# Patient Record
Sex: Male | Born: 1940 | Hispanic: Yes | Smoking: Former smoker
Health system: Southern US, Community
[De-identification: ages and names within clinical notes are randomized; demographics above are authoritative.]

## PROBLEM LIST (undated history)

## (undated) DIAGNOSIS — F1910 Other psychoactive substance abuse, uncomplicated: Secondary | ICD-10-CM

## (undated) DIAGNOSIS — H9193 Unspecified hearing loss, bilateral: Secondary | ICD-10-CM

## (undated) DIAGNOSIS — I10 Essential (primary) hypertension: Secondary | ICD-10-CM

## (undated) DIAGNOSIS — B1920 Unspecified viral hepatitis C without hepatic coma: Secondary | ICD-10-CM

## (undated) DIAGNOSIS — E119 Type 2 diabetes mellitus without complications: Secondary | ICD-10-CM

## (undated) DIAGNOSIS — C220 Liver cell carcinoma: Secondary | ICD-10-CM

## (undated) DIAGNOSIS — A048 Other specified bacterial intestinal infections: Secondary | ICD-10-CM

## (undated) DIAGNOSIS — D638 Anemia in other chronic diseases classified elsewhere: Secondary | ICD-10-CM

## (undated) DIAGNOSIS — I219 Acute myocardial infarction, unspecified: Secondary | ICD-10-CM

## (undated) DIAGNOSIS — F0390 Unspecified dementia without behavioral disturbance: Secondary | ICD-10-CM

## (undated) DIAGNOSIS — K7460 Unspecified cirrhosis of liver: Secondary | ICD-10-CM

## (undated) DIAGNOSIS — E785 Hyperlipidemia, unspecified: Secondary | ICD-10-CM

## (undated) DIAGNOSIS — H269 Unspecified cataract: Secondary | ICD-10-CM

## (undated) HISTORY — DX: Liver cell carcinoma: C22.0

## (undated) HISTORY — DX: Other psychoactive substance abuse, uncomplicated: F19.10

## (undated) HISTORY — DX: Anemia in other chronic diseases classified elsewhere: D63.8

## (undated) HISTORY — DX: Unspecified cataract: H26.9

## (undated) HISTORY — PX: UPPER GASTROINTESTINAL ENDOSCOPY: SHX188

## (undated) HISTORY — DX: Unspecified hearing loss, bilateral: H91.93

## (undated) HISTORY — DX: Other specified bacterial intestinal infections: A04.8

## (undated) HISTORY — DX: Hyperlipidemia, unspecified: E78.5

## (undated) HISTORY — DX: Essential (primary) hypertension: I10

## (undated) HISTORY — DX: Acute myocardial infarction, unspecified: I21.9

## (undated) HISTORY — PX: PACEMAKER INSERTION: SHX728

## (undated) HISTORY — DX: Unspecified viral hepatitis C without hepatic coma: B19.20

## (undated) HISTORY — DX: Unspecified dementia, unspecified severity, without behavioral disturbance, psychotic disturbance, mood disturbance, and anxiety: F03.90

## (undated) HISTORY — DX: Type 2 diabetes mellitus without complications: E11.9

---

## 1964-02-04 HISTORY — PX: COLOSTOMY REVERSAL: SHX5782

## 1964-02-04 HISTORY — PX: STOMACH SURGERY: SHX791

## 1984-02-04 HISTORY — PX: HERNIA REPAIR: SHX51

## 2007-02-04 DIAGNOSIS — I219 Acute myocardial infarction, unspecified: Secondary | ICD-10-CM

## 2007-02-04 HISTORY — DX: Acute myocardial infarction, unspecified: I21.9

## 2007-02-04 HISTORY — PX: CORONARY ANGIOPLASTY WITH STENT PLACEMENT: SHX49

## 2012-10-05 DIAGNOSIS — H2510 Age-related nuclear cataract, unspecified eye: Secondary | ICD-10-CM | POA: Diagnosis not present

## 2012-10-05 DIAGNOSIS — E119 Type 2 diabetes mellitus without complications: Secondary | ICD-10-CM | POA: Diagnosis not present

## 2012-10-05 DIAGNOSIS — H26019 Infantile and juvenile cortical, lamellar, or zonular cataract, unspecified eye: Secondary | ICD-10-CM | POA: Diagnosis not present

## 2012-10-07 ENCOUNTER — Encounter: Payer: Self-pay | Admitting: Family Medicine

## 2012-10-07 ENCOUNTER — Telehealth: Payer: Self-pay | Admitting: *Deleted

## 2012-10-07 ENCOUNTER — Ambulatory Visit (INDEPENDENT_AMBULATORY_CARE_PROVIDER_SITE_OTHER): Payer: Medicare Other | Admitting: Family Medicine

## 2012-10-07 VITALS — BP 132/70 | Temp 97.8°F | Wt 206.4 lb

## 2012-10-07 DIAGNOSIS — D638 Anemia in other chronic diseases classified elsewhere: Secondary | ICD-10-CM | POA: Diagnosis not present

## 2012-10-07 DIAGNOSIS — E785 Hyperlipidemia, unspecified: Secondary | ICD-10-CM | POA: Insufficient documentation

## 2012-10-07 DIAGNOSIS — B356 Tinea cruris: Secondary | ICD-10-CM

## 2012-10-07 DIAGNOSIS — I10 Essential (primary) hypertension: Secondary | ICD-10-CM | POA: Insufficient documentation

## 2012-10-07 DIAGNOSIS — B182 Chronic viral hepatitis C: Secondary | ICD-10-CM | POA: Diagnosis not present

## 2012-10-07 DIAGNOSIS — Z87438 Personal history of other diseases of male genital organs: Secondary | ICD-10-CM | POA: Insufficient documentation

## 2012-10-07 DIAGNOSIS — E119 Type 2 diabetes mellitus without complications: Secondary | ICD-10-CM | POA: Insufficient documentation

## 2012-10-07 DIAGNOSIS — Z87898 Personal history of other specified conditions: Secondary | ICD-10-CM | POA: Diagnosis not present

## 2012-10-07 DIAGNOSIS — Z9861 Coronary angioplasty status: Secondary | ICD-10-CM | POA: Diagnosis not present

## 2012-10-07 DIAGNOSIS — I2510 Atherosclerotic heart disease of native coronary artery without angina pectoris: Secondary | ICD-10-CM | POA: Diagnosis not present

## 2012-10-07 DIAGNOSIS — E538 Deficiency of other specified B group vitamins: Secondary | ICD-10-CM | POA: Insufficient documentation

## 2012-10-07 DIAGNOSIS — Z950 Presence of cardiac pacemaker: Secondary | ICD-10-CM | POA: Insufficient documentation

## 2012-10-07 HISTORY — DX: Anemia in other chronic diseases classified elsewhere: D63.8

## 2012-10-07 MED ORDER — BLOOD GLUCOSE MONITOR SYSTEM W/DEVICE KIT: PACK | Status: DC

## 2012-10-07 MED ORDER — TERBINAFINE HCL 1 % EX SOLN: CUTANEOUS | Status: DC

## 2012-10-07 NOTE — Progress Notes (Signed)
Subjective:    Patient ID: [name] [name], male    DOB: 1940/09/08, 72 y.o.   MRN: [id number]  HPI [name] [name] is a 72 y.o Hispanic male here as a new patient. He is from originally from [place] but recently got out of prison in the last month. He was in [place] in prison for 18 years. He begins by saying that he has been diagnosed with diabetes some years ago. At first, he was only on pills including metformin but now they placed him on Regular insulin and NPH of which he injects both at the  Same time and does these twice a day. He also reports low sugars and daughter is in the room with him of which he lives with. She states there are times she doesn't give him his insulin due to low sugars, both morning and during the evening time. He says he knows when his sugar is low because he gets clammy, cold and has tremors. He says his diabetes varies. His sugars range between 50-280s. He has times in the middle of the night where he has shakiness and he'll eat something. He says he knows it's low at these times but he hasn't actually check it.  He has BPH of which he is on hytrin for. He has been on this for several years. He has occasional urinary urgency but this is better. He recently was seen for dysuria and after review of medical chart. He had an E. Coli UTI and was given antibiotics. He says this cleared up.  He also reports a foot rash to his right foot. He says it's been present for the last several months. He says it itches but doesn't hurt. Its along his toes and he denies any new lesions or exposures to anything. He has tried Tinactin OTC which haven't worked.   He has complaints of chills and nausea from time to time. It occurs and lasts for about a day but is self limiting. He says usually he feels tired and lays around all that day. He usually doesn't have to take any medicines or he doesn't seek any medical attention.   He has occasional chest discomfort of which he has been on nitroglycerin for . He is  on imdur now TID and says his chest pain is better. He has had a cath in March 2014 which showed a patent circumflex artery but also showed borderline stenosis of the LAD at the bifurcation with the diagonal branch and a fractional flow reserve of 85%. It also showed severe stenosis involving a mid segment of the diagonal branch. Moderate disease involving the RCA.  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 Diabetes, CAD, HTN, HLD, BPH (all of which was diagnosed while he was incarcerated)  PSH: Surgeries: one cardiac stent placed in 2009 and pacemaker placed 2009 (zotarolimus-eluting coronary stent in Circumflex) He says the pacemaker was placed due to fast heart beat? He was on a blood thinner for a few months but is only on ASA 81 mg now.  Medications: see medication list for dosage: folic acid, glyburide BID, Imdur, Lisinopril, Lopressor, Hytrin. He states they put him on albuterol and asmanex while incarcerated of which he doesn't take. He also stopped the lasix and KCL. He said the lasix didn't help and it made him use the restroom often. He stopped the zocor due to effects of his liver. He doesn't take the nitrostat either.  Allergies:NSAIDS due to Hep C  Social: haven't smoked since 2005. No alcohol use since 18 years since prison. Haven't used IVDU since 18 years.Lives with daughter in [place].  Family Hx: Noncontributory  Review of Systems  Constitutional: Positive for chills. Negative for fever, activity change and appetite change.  HENT: Positive for hearing loss and tinnitus. Negative for ear pain, congestion, rhinorrhea, sneezing and postnasal drip.   Eyes: Negative for redness, itching and visual disturbance.  Respiratory: Negative for cough, chest tightness, shortness of breath and wheezing.   Cardiovascular: Negative for chest pain, palpitations and leg swelling.  Gastrointestinal: Negative for nausea, vomiting, abdominal  pain, diarrhea and constipation.  Endocrine: Negative for cold intolerance, heat intolerance, polydipsia and polyuria.  Genitourinary: Positive for urgency. Negative for dysuria, flank pain, decreased urine volume, discharge, scrotal swelling and penile pain.       Hx of BPH but better with medication  Musculoskeletal: Negative for myalgias and arthralgias.  Skin: Positive for rash. Negative for color change and wound.       On right foot, chronic  Allergic/Immunologic: Negative for environmental allergies and food allergies.  Neurological: Negative for dizziness, seizures, weakness, light-headedness, numbness and headaches.  Hematological: Negative for adenopathy. Does not bruise/bleed easily.  Psychiatric/Behavioral: Negative for confusion, sleep disturbance and agitation.       Objective:   Physical Exam  Nursing note and vitals reviewed. Constitutional: He is oriented to person, place, and time. He appears well-developed and well-nourished.  HENT:  Head: Normocephalic and atraumatic.  Right Ear: External ear normal.  Left Ear: External ear normal.  Nose: Nose normal.  Mouth/Throat: Oropharynx is clear and moist.  Eyes: Conjunctivae are normal. Pupils are equal, round, and reactive to light.  cataracts bilaterally  Neck: Normal range of motion. Neck supple. No thyromegaly present.  Cardiovascular: Normal rate, regular rhythm, normal heart sounds and intact distal pulses.   Pulmonary/Chest: Effort normal and breath sounds normal. No respiratory distress. He has no wheezes. He exhibits no tenderness.  Abdominal: Soft. Bowel sounds are normal. He exhibits no distension. There is no tenderness.  Musculoskeletal: Normal range of motion.  Neurological: He is alert and oriented to person, place, and time. He has normal reflexes.  Skin: Skin is warm and dry. Rash noted.  Scaly erythematous lesion to plantar right foot along metatarsals  Psychiatric: He has a normal mood and affect. His  behavior is normal. Judgment and thought content normal.   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  Diagnoses and associated orders for this visit:  History of BPH  HLD (hyperlipidemia) - Comprehensive metabolic panel; Future - Lipid panel; Future - Comprehensive metabolic panel - Lipid panel  HTN (hypertension) - Comprehensive metabolic panel; Future - Ambulatory referral to Cardiology - Comprehensive metabolic panel  CAD S/P percutaneous coronary angioplasty Comments: 2009 - Ambulatory referral to Cardiology  Hepatitis C, chronic - Lipid panel; Future - HIV antibody; Future - Lipid panel - HIV antibody - Ambulatory referral to Gastroenterology  Tinea cruris - Terbinafine HCl 1 % SOLN; Apply to affected areas BID  Diabetes mellitus - Hemoglobin A1c; Future - Hemoglobin A1c - Blood Glucose Monitoring Suppl (BLOOD GLUCOSE MONITOR SYSTEM) W/DEVICE KIT; Check blood sugars morning, lunch, and before bedtime.  Anemia, chronic disease - CBC with Differential; Future -  CBC with Differential  Pacemaker - Ambulatory referral to Cardiology  Folic acid deficiency   -as a new patient, have reviewed previous records including lab work from May 2014 and cath report from 04/2012 and cardiac follow up with the last visit being in June 2014.   -Will refer to cardiology due to hx of cardiac stent and now with a pacemaker.  -will also refer to GI due to hx of Hep C and to evaluate for any options if any for treatment. -Have discontinued the insulin for now and he is to stay on glyburide and check sugars TID. A blood sugar log was given to him and will follow up in 2 weeks of these readings. Him or his daughter will contact me if there are any sugars during the morning or  lunch hours that are above 200.  -his foot rash is likely tinea infection and will treat with topicals for now due to hx of hep C and unable to use any orals due to effects of liver. -He is to continue all other medications for now. Have advised for him to discontinue the zocor as he's been doing until a lipid and liver function panel is done and reviewed by me.   Total face to face time including review of records with the patient-50 minutes

## 2012-10-07 NOTE — Telephone Encounter (Signed)
Noreene Larsson from [facility] called requiring dx code for cbg supplies. Code given.

## 2012-10-07 NOTE — Telephone Encounter (Signed)
[name] [name] from pharmacy left VM requesting callback. Nurse returned call and [name] [name] stated that pt had come in and they were unsure as to where the Terbinafine solution was supposed to go. After reviewing MD note nurse informed [name] [name] that there was a rash noted on his foot, [name] [name] stated he would change to cream and let pt know that it was to be applied to areas on foot.

## 2012-10-07 NOTE — Patient Instructions (Addendum)
Daily Diabetes Record Week of _____________________________ Date: _________  [name] [name], BG/Medications: ________________ / __________________________________________________________  LUNCH, BG/Medications: ____________________ / __________________________________________________________  [name] [name], BG/Medications: ___________________ / __________________________________________________________  BEDTIME, BG/Medications: __________________ / __________________________________________________________ Date: _________  [name] [name], BG/Medications: ________________ / __________________________________________________________  LUNCH, BG/Medications: ____________________ / __________________________________________________________  [name] [name], BG/Medications: ___________________ / __________________________________________________________  BEDTIME, BG/Medications: __________________ / __________________________________________________________ Date: _________  [name] [name], BG/Medications: ________________ / __________________________________________________________  LUNCH, BG/Medications: ____________________ / __________________________________________________________  [name] [name], BG/Medications: ___________________ / __________________________________________________________  BEDTIME, BG/Medications: __________________ / __________________________________________________________ Date: _________  [name] [name], BG/Medications: ________________ / __________________________________________________________  LUNCH, BG/Medications: ____________________ / __________________________________________________________  [name] [name], BG/Medications: ___________________ / __________________________________________________________  BEDTIME, BG/Medications: __________________ / __________________________________________________________ Date: _________  [name] [name], BG/Medications: ________________ /  __________________________________________________________  LUNCH, BG/Medications: ____________________ / __________________________________________________________  [name] [name], BG/Medications: ___________________ / __________________________________________________________  BEDTIME, BG/Medications: __________________ / __________________________________________________________ Date: _________  [name] [name], BG/Medications: ________________ / __________________________________________________________  LUNCH, BG/Medications: ____________________ / __________________________________________________________  [name] [name], BG/Medications: ___________________ / __________________________________________________________  BEDTIME, BG/Medications: __________________ / __________________________________________________________ Date: _________  [name] [name], BG/Medications: ________________ / __________________________________________________________  LUNCH, BG/Medications: ____________________ / __________________________________________________________  [name] [name], BG/Medications: ___________________ / __________________________________________________________  BEDTIME, BG/Medications: __________________ / __________________________________________________________ Notes: __________________________________________________________________________________________________ Document Released: 12/25/2003 Document Revised: 04/14/2011 Document Reviewed: 10/18/2008 [employer] Patient Information 2014 [employer].

## 2012-10-08 ENCOUNTER — Encounter: Payer: Self-pay | Admitting: Family Medicine

## 2012-10-08 LAB — CBC WITH DIFFERENTIAL/PLATELET
HCT: 37.6 % — ABNORMAL LOW (ref 39.0–52.0)
Hemoglobin: 13 g/dL (ref 13.0–17.0)
Lymphs Abs: 1.2 10*3/uL (ref 0.7–4.0)
MCH: 30.8 pg (ref 26.0–34.0)
Monocytes Absolute: 0.4 10*3/uL (ref 0.1–1.0)
Monocytes Relative: 8 % (ref 3–12)
Neutro Abs: 3 10*3/uL (ref 1.7–7.7)
Neutrophils Relative %: 62 % (ref 43–77)
RBC: 4.22 MIL/uL (ref 4.22–5.81)

## 2012-10-08 LAB — LIPID PANEL
Cholesterol: 124 mg/dL (ref 0–200)
VLDL: 32 mg/dL (ref 0–40)

## 2012-10-08 LAB — HEMOGLOBIN A1C
Hgb A1c MFr Bld: 7.2 % — ABNORMAL HIGH (ref <5.7)
Mean Plasma Glucose: 160 mg/dL — ABNORMAL HIGH (ref <117)

## 2012-10-08 LAB — COMPREHENSIVE METABOLIC PANEL
ALT: 125 U/L — ABNORMAL HIGH (ref 0–53)
AST: 117 U/L — ABNORMAL HIGH (ref 0–37)
Alkaline Phosphatase: 128 U/L — ABNORMAL HIGH (ref 39–117)
BUN: 18 mg/dL (ref 6–23)
Calcium: 8.6 mg/dL (ref 8.4–10.5)
Creat: 0.72 mg/dL (ref 0.50–1.35)
Total Bilirubin: 0.6 mg/dL (ref 0.3–1.2)

## 2012-10-13 DIAGNOSIS — H2510 Age-related nuclear cataract, unspecified eye: Secondary | ICD-10-CM | POA: Diagnosis not present

## 2012-10-13 DIAGNOSIS — H26019 Infantile and juvenile cortical, lamellar, or zonular cataract, unspecified eye: Secondary | ICD-10-CM | POA: Diagnosis not present

## 2012-10-20 DIAGNOSIS — H2510 Age-related nuclear cataract, unspecified eye: Secondary | ICD-10-CM | POA: Diagnosis not present

## 2012-10-20 DIAGNOSIS — H26019 Infantile and juvenile cortical, lamellar, or zonular cataract, unspecified eye: Secondary | ICD-10-CM | POA: Diagnosis not present

## 2012-10-21 ENCOUNTER — Ambulatory Visit (INDEPENDENT_AMBULATORY_CARE_PROVIDER_SITE_OTHER): Payer: Medicare Other | Admitting: Family Medicine

## 2012-10-21 ENCOUNTER — Encounter: Payer: Self-pay | Admitting: Family Medicine

## 2012-10-21 VITALS — BP 112/60 | Temp 98.9°F | Wt 206.2 lb

## 2012-10-21 DIAGNOSIS — B359 Dermatophytosis, unspecified: Secondary | ICD-10-CM | POA: Diagnosis not present

## 2012-10-21 DIAGNOSIS — E119 Type 2 diabetes mellitus without complications: Secondary | ICD-10-CM | POA: Diagnosis not present

## 2012-10-21 MED ORDER — NYSTATIN 100000 UNIT/GM EX POWD: CUTANEOUS | Status: DC

## 2012-10-21 NOTE — Progress Notes (Signed)
Subjective:    Patient ID: [name] [name], male    DOB: 1940-03-25, 72 y.o.   MRN: [id number]  Diabetes He presents for his follow-up diabetic visit. He has type 2 diabetes mellitus. The initial diagnosis of diabetes was made 20 years ago. His disease course has been stable. There are no hypoglycemic associated symptoms. Pertinent negatives for diabetes include no blurred vision, no chest pain, no foot paresthesias, no foot ulcerations, no polydipsia, no polyphagia, no polyuria, no visual change and no weakness. There are no hypoglycemic complications. Symptoms are improving. Diabetic complications include heart disease. Current diabetic treatment includes oral agent (monotherapy). He is compliant with treatment all of the time. He is following a low fat/cholesterol diet. He rarely participates in exercise. His home blood glucose trend is increasing steadily. His breakfast blood glucose range is generally 130-140 mg/dl. His lunch blood glucose range is generally 140-180 mg/dl. His dinner blood glucose range is generally 140-180 mg/dl. An ACE inhibitor/angiotensin II receptor blocker is being taken. He does not see a podiatrist.Eye exam is current (had recent catarract surgery ( left eye a week ago and right eye yesterday)).   I saw the patient initially a few weeks ago as a new patient. He has multiple comorbidities. One of which is diabetes. He was on two different insulins TID along with glyburide BID. He had multiple episodes of hypoglycemia with shakiness.   During my visit with him, I stopped his insulin and told him to continue the GLyburide BID. He was to monitor his sugars and follow up in 2 weeks. He is here today with his daughter who takes care of him. She brought in the sugar log of which is sugars have average 82-220. For the sugar readings that's been around 200, he drank soda that day.  The other days, his sugars have been stable and he says he has no further symptoms of his hypoglycemia.  He  says he feels better.  He was also seen for this rash on the top part of his right foot. He used steroid cream which didn't help. When he was incarcerated, he also had these lesions to his trunk of which he says they told him it was fungus. At that time, I gave him terbinafine cream to use since he had Hep C and I couldn't prescribe po terbinafine for 8 weeks. He is here and says it's a little better but not resolved. He says it itches somewhat but the steroid cream does help with that.    Review of Systems  Eyes: Negative for blurred vision.  Respiratory: Negative for chest tightness, shortness of breath and wheezing.   Cardiovascular: Negative for chest pain and palpitations.  Endocrine: Negative for cold intolerance, heat intolerance, polydipsia, polyphagia and polyuria.  Skin: Positive for color change and rash.       To top part of right foot chronic  Neurological: Negative for weakness.       Objective:   Physical Exam  Nursing note and vitals reviewed. Constitutional: He is oriented to person, place, and time. He appears well-developed and well-nourished.  HENT:  Head: Normocephalic and atraumatic.  Right Ear: External ear normal.  Left Ear: External ear normal.  Cardiovascular: Normal rate, regular rhythm and normal heart sounds.   Pulmonary/Chest: Effort normal and breath sounds normal. No respiratory distress. He has no wheezes. He exhibits no tenderness.  Abdominal: Soft. Bowel sounds are normal. He exhibits no distension. There is no tenderness. There is no rebound.  Neurological: He is  oriented to person, place, and time.  Skin: Skin is warm and dry. Rash noted.  Erythematous scaly lesion to dorsum right foot. No drainage or edema  Psychiatric: He has a normal mood and affect. His behavior is normal.      Assessment & Plan:  [name] [name] was seen today for follow-up.  Diagnoses and associated orders for this visit:  Diabetes mellitus  Tinea - nystatin  (MYCOSTATIN/NYSTOP) 100000 UNIT/GM POWD; Apply to both feet daily  - will continue current regimen of Glyburide 5mg  in the morning and 2.5 mg at QHS. They will check sugars fasting in the morning and before bedtime. They are to bring this log back with them in 4-6 weeks.  -will send in nystatin powder for this lesion that resembles tinea. If this doesn't help, will send to podiatry. -during the initial visit, I referred him to Cardiology to establish care for his hx of angina, pacemaker, CAD, and stent.  He also was referred to GI for his hx of Hep C and possible treatment options.  I've explained to them that I will follow up on both of these referrals. For now, he is to continue current medications as prescribed to him from Cardiology.

## 2012-10-29 ENCOUNTER — Other Ambulatory Visit: Payer: Self-pay | Admitting: *Deleted

## 2012-10-29 MED ORDER — METOPROLOL TARTRATE 50 MG PO TABS: 50.0000 mg | ORAL_TABLET | Freq: Two times a day (BID) | ORAL | Status: DC

## 2012-10-29 MED ORDER — LISINOPRIL 2.5 MG PO TABS: 2.5000 mg | ORAL_TABLET | Freq: Every day | ORAL | Status: DC

## 2012-10-29 MED ORDER — ISOSORBIDE MONONITRATE ER 30 MG PO TB24: 90.0000 mg | ORAL_TABLET | Freq: Every day | ORAL | Status: DC

## 2012-11-25 ENCOUNTER — Ambulatory Visit (INDEPENDENT_AMBULATORY_CARE_PROVIDER_SITE_OTHER): Payer: Medicare Other | Admitting: Family Medicine

## 2012-11-25 ENCOUNTER — Encounter: Payer: Self-pay | Admitting: Family Medicine

## 2012-11-25 VITALS — BP 110/60 | HR 69 | Temp 97.9°F | Resp 16 | Ht 68.0 in | Wt 201.4 lb

## 2012-11-25 DIAGNOSIS — M25569 Pain in unspecified knee: Secondary | ICD-10-CM | POA: Diagnosis not present

## 2012-11-25 DIAGNOSIS — N400 Enlarged prostate without lower urinary tract symptoms: Secondary | ICD-10-CM

## 2012-11-25 DIAGNOSIS — E119 Type 2 diabetes mellitus without complications: Secondary | ICD-10-CM

## 2012-11-25 DIAGNOSIS — B1920 Unspecified viral hepatitis C without hepatic coma: Secondary | ICD-10-CM

## 2012-11-25 DIAGNOSIS — B359 Dermatophytosis, unspecified: Secondary | ICD-10-CM

## 2012-11-25 DIAGNOSIS — M25561 Pain in right knee: Secondary | ICD-10-CM

## 2012-11-25 DIAGNOSIS — E538 Deficiency of other specified B group vitamins: Secondary | ICD-10-CM | POA: Insufficient documentation

## 2012-11-25 MED ORDER — TERAZOSIN HCL 5 MG PO CAPS: 5.0000 mg | ORAL_CAPSULE | Freq: Every evening | ORAL | Status: DC

## 2012-11-25 MED ORDER — DICLOFENAC SODIUM 1 % TD GEL: TRANSDERMAL | Status: DC

## 2012-11-25 MED ORDER — GLYBURIDE 2.5 MG PO TABS: ORAL_TABLET | ORAL | Status: DC

## 2012-11-25 MED ORDER — FOLIC ACID 1 MG PO TABS: 1.0000 mg | ORAL_TABLET | Freq: Every day | ORAL | Status: DC

## 2012-11-25 NOTE — Progress Notes (Signed)
Subjective:    Patient ID: [name] [name], male    DOB: May 16, 1940, 72 y.o.   MRN: [id number]  Diabetes He presents for his follow-up diabetic visit. He has type 2 diabetes mellitus. His disease course has been stable. There are no hypoglycemic associated symptoms. Pertinent negatives for hypoglycemia include no dizziness, headaches, nervousness/anxiousness or speech difficulty. Pertinent negatives for diabetes include no blurred vision, no chest pain, no fatigue, no foot paresthesias, no foot ulcerations, no polydipsia, no polyuria, no visual change, no weakness and no weight loss. There are no hypoglycemic complications. Diabetic complications include heart disease and peripheral neuropathy. Risk factors for coronary artery disease include diabetes mellitus and male sex. Current diabetic treatment includes oral agent (monotherapy). He is compliant with treatment all of the time. He participates in exercise daily. His breakfast blood glucose range is generally 110-130 mg/dl. His dinner blood glucose range is generally >200 mg/dl. An ACE inhibitor/angiotensin II receptor blocker is being taken. Eye exam is current.  Rash This is a chronic problem. The current episode started more than 1 year ago. The problem has been waxing and waning since onset. The affected locations include the right foot. The rash is characterized by itchiness. He was exposed to nothing. Pertinent negatives include no cough, diarrhea, fatigue, shortness of breath or vomiting. Treatments tried: nystatin powder. The treatment provided significant relief.  Knee Pain  Incident onset: chronic for years. There was no injury mechanism. The pain is present in the right knee. The quality of the pain is described as aching. The pain is at a severity of 5/10. The pain is moderate. The pain has been intermittent since onset. Pertinent negatives include no inability to bear weight, loss of motion, loss of sensation, muscle weakness, numbness or  tingling. The symptoms are aggravated by weight bearing and movement. He has tried immobilization (has done the ace wrap in the past.  He has tried heat as well but hasn't worked) for the symptoms. The treatment provided no relief.  He also request medication refills on his medicines.  He is on glyburide 5mg  am and 2.5 mg at bedtime. He says his sugars are elevated at night usually because of what he ate that day. Most mornings, his sugar doesn't get above 130.  PMH: CAD, DM, Hep C, BPH, Anemia Medications: as listed Allergies: avoid Nsaids due to Hep C Surgeries: pacemaker, bilateral catarracts   Review of Systems  Constitutional: Negative for weight loss, diaphoresis, activity change, fatigue and unexpected weight change.  HENT: Negative for sinus pressure.   Eyes: Negative for blurred vision, photophobia and visual disturbance.  Respiratory: Negative for cough, chest tightness, shortness of breath and wheezing.   Cardiovascular: Negative for chest pain and palpitations.  Gastrointestinal: Negative for nausea, vomiting, abdominal pain, diarrhea and constipation.  Endocrine: Negative for polydipsia and polyuria.  Musculoskeletal: Positive for arthralgias and gait problem. Negative for back pain and joint swelling.       Right knee pain, worse with weight bearing   Skin: Positive for rash.  Neurological: Negative for dizziness, tingling, speech difficulty, weakness, numbness and headaches.  Psychiatric/Behavioral: Negative for sleep disturbance and self-injury. The patient is not nervous/anxious.        Objective:   Physical Exam  Nursing note and vitals reviewed. Constitutional: He is oriented to person, place, and time. He appears well-developed and well-nourished.  HENT:  Head: Normocephalic and atraumatic.  Right Ear: External ear normal.  Left Ear: External ear normal.  Cardiovascular: Normal rate, regular rhythm and  normal heart sounds.   Pulmonary/Chest: Effort normal and  breath sounds normal. No respiratory distress. He has no wheezes.  Abdominal: Soft. Bowel sounds are normal.  Musculoskeletal: He exhibits tenderness.  Decreased ROM with extension secondary to pain Moderate amount of crepitus to right knee, minimal to left knee.  No warmth, erythema, or edema   Neurological: He is alert and oriented to person, place, and time.  Skin: Skin is warm and dry.  Psychiatric: He has a normal mood and affect. His behavior is normal.      Assessment & Plan:  Richad was seen today for follow-up.  Diagnoses and associated orders for this visit:  Diabetes - glyBURIDE (DIABETA) 2.5 MG tablet; Take 2 tabs in the morning and 1 tab at bedtime.  Tinea  Right knee pain - diclofenac sodium (VOLTAREN) 1 % GEL; Apply to right knee every 8 hours as needed for knee pain  BPH (benign prostatic hyperplasia) - terazosin (HYTRIN) 5 MG capsule; Take 1 capsule (5 mg total) by mouth every evening.  Vitamin B12 deficiency - folic acid (FOLVITE) 1 MG tablet; Take 1 tablet (1 mg total) by mouth daily.  Hepatitis C   -refilled medication for diabetes, vitamin b12 deficiency, and BPH. He is to continue monitoring sugars and follow up in 2 months. Blood work at that time -will try diclofenac gel for right knee pain since patient has hx of Hep C and can't take any nsaids and minimal amount of tylenol is allowed -will follow up on GI and cardiology referrals that were made during the initial visit.

## 2012-11-25 NOTE — Patient Instructions (Signed)
Knee Pain The knee is the complex joint between your thigh and your lower leg. It is made up of bones, tendons, ligaments, and cartilage. The bones that make up the knee are:  The femur in the thigh.  The tibia and fibula in the lower leg.  The patella or kneecap riding in the groove on the lower femur. CAUSES  Knee pain is a common complaint with many causes. A few of these causes are:  Injury, such as:  A ruptured ligament or tendon injury.  Torn cartilage.  Medical conditions, such as:  Gout  Arthritis  Infections  Overuse, over training or overdoing a physical activity. Knee pain can be minor or severe. Knee pain can accompany debilitating injury. Minor knee problems often respond well to self-care measures or get well on their own. More serious injuries may need medical intervention or even surgery. SYMPTOMS The knee is complex. Symptoms of knee problems can vary widely. Some of the problems are:  Pain with movement and weight bearing.  Swelling and tenderness.  Buckling of the knee.  Inability to straighten or extend your knee.  Your knee locks and you cannot straighten it.  Warmth and redness with pain and fever.  Deformity or dislocation of the kneecap. DIAGNOSIS  Determining what is wrong may be very straight forward such as when there is an injury. It can also be challenging because of the complexity of the knee. Tests to make a diagnosis may include:  Your caregiver taking a history and doing a physical exam.  Routine X-rays can be used to rule out other problems. X-rays will not reveal a cartilage tear. Some injuries of the knee can be diagnosed by:  Arthroscopy a surgical technique by which a small video camera is inserted through tiny incisions on the sides of the knee. This procedure is used to examine and repair internal knee joint problems. Tiny instruments can be used during arthroscopy to repair the torn knee cartilage (meniscus).  Arthrography  is a radiology technique. A contrast liquid is directly injected into the knee joint. Internal structures of the knee joint then become visible on X-ray film.  An MRI scan is a non x-ray radiology procedure in which magnetic fields and a computer produce two- or three-dimensional images of the inside of the knee. Cartilage tears are often visible using an MRI scanner. MRI scans have largely replaced arthrography in diagnosing cartilage tears of the knee.  Blood work.  Examination of the fluid that helps to lubricate the knee joint (synovial fluid). This is done by taking a sample out using a needle and a syringe. TREATMENT The treatment of knee problems depends on the cause. Some of these treatments are:  Depending on the injury, proper casting, splinting, surgery or physical therapy care will be needed.  Give yourself adequate recovery time. Do not overuse your joints. If you begin to get sore during workout routines, back off. Slow down or do fewer repetitions.  For repetitive activities such as cycling or running, maintain your strength and nutrition.  Alternate muscle groups. For example if you are a weight lifter, work the upper body on one day and the lower body the next.  Either tight or weak muscles do not give the proper support for your knee. Tight or weak muscles do not absorb the stress placed on the knee joint. Keep the muscles surrounding the knee strong.  Take care of mechanical problems.  If you have flat feet, orthotics or special shoes may help.   See your caregiver if you need help.  Arch supports, sometimes with wedges on the inner or outer aspect of the heel, can help. These can shift pressure away from the side of the knee most bothered by osteoarthritis.  A brace called an "unloader" brace also may be used to help ease the pressure on the most arthritic side of the knee.  If your caregiver has prescribed crutches, braces, wraps or ice, use as directed. The acronym for  this is PRICE. This means protection, rest, ice, compression and elevation.  Nonsteroidal anti-inflammatory drugs (NSAID's), can help relieve pain. But if taken immediately after an injury, they may actually increase swelling. Take NSAID's with food in your stomach. Stop them if you develop stomach problems. Do not take these if you have a history of ulcers, stomach pain or bleeding from the bowel. Do not take without your caregiver's approval if you have problems with fluid retention, heart failure, or kidney problems.  For ongoing knee problems, physical therapy may be helpful.  Glucosamine and chondroitin are over-the-counter dietary supplements. Both may help relieve the pain of osteoarthritis in the knee. These medicines are different from the usual anti-inflammatory drugs. Glucosamine may decrease the rate of cartilage destruction.  Injections of a corticosteroid drug into your knee joint may help reduce the symptoms of an arthritis flare-up. They may provide pain relief that lasts a few months. You may have to wait a few months between injections. The injections do have a small increased risk of infection, water retention and elevated blood sugar levels.  Hyaluronic acid injected into damaged joints may ease pain and provide lubrication. These injections may work by reducing inflammation. A series of shots may give relief for as long as 6 months.  Topical painkillers. Applying certain ointments to your skin may help relieve the pain and stiffness of osteoarthritis. Ask your pharmacist for suggestions. Many over the-counter products are approved for temporary relief of arthritis pain.  In some countries, doctors often prescribe topical NSAID's for relief of chronic conditions such as arthritis and tendinitis. A review of treatment with NSAID creams found that they worked as well as oral medications but without the serious side effects. PREVENTION  Maintain a healthy weight. Extra pounds put  more strain on your joints.  Get strong, stay limber. Weak muscles are a common cause of knee injuries. Stretching is important. Include flexibility exercises in your workouts.  Be smart about exercise. If you have osteoarthritis, chronic knee pain or recurring injuries, you may need to change the way you exercise. This does not mean you have to stop being active. If your knees ache after jogging or playing basketball, consider switching to swimming, water aerobics or other low-impact activities, at least for a few days a week. Sometimes limiting high-impact activities will provide relief.  Make sure your shoes fit well. Choose footwear that is right for your sport.  Protect your knees. Use the proper gear for knee-sensitive activities. Use kneepads when playing volleyball or laying carpet. Buckle your seat belt every time you drive. Most shattered kneecaps occur in car accidents.  Rest when you are tired. SEEK MEDICAL CARE IF:  You have knee pain that is continual and does not seem to be getting better.  SEEK IMMEDIATE MEDICAL CARE IF:  Your knee joint feels hot to the touch and you have a high fever. MAKE SURE YOU:   Understand these instructions.  Will watch your condition.  Will get help right away if you are not   doing well or get worse. Document Released: 11/17/2006 Document Revised: 04/14/2011 Document Reviewed: 11/17/2006 [facility] Patient Information 2014 [place], [place]. Diabetes and Foot Care Diabetes may cause you to have a poor blood supply (circulation) to your legs and feet. Because of this, the skin may be thinner, break easier, and heal more slowly. You also may have nerve damage in your legs and feet causing decreased feeling. You may not notice minor injuries to your feet that could lead to serious problems or infections. Taking care of your feet is one of the most important things you can do for yourself.  HOME CARE INSTRUCTIONS  Do not go barefoot. Bare feet are easily  injured.  Check your feet daily for blisters, cuts, and redness.  Wash your feet with warm water (not hot) and mild soap. Pat your feet and between your toes until completely dry.  Apply a moisturizing lotion that does not contain alcohol or petroleum jelly to the dry skin on your feet and to dry brittle toenails. Do not put it between your toes.  Trim your toenails straight across. Do not dig under them or around the cuticle.  Do not cut corns or calluses, or try to remove them with medicine.  Wear clean cotton socks or stockings every day. Make sure they are not too tight. Do not wear knee high stockings since they may decrease blood flow to your legs.  Wear leather shoes that fit properly and have enough cushioning. To break in new shoes, wear them just a few hours a day to avoid injuring your feet.  Wear shoes at all times, even in the house.  Do not cross your legs. This may decrease the blood flow to your feet.  If you find a minor scrape, cut, or break in the skin on your feet, keep it and the skin around it clean and dry. These areas may be cleansed with mild soap and water. Do not use peroxide, alcohol, iodine or Merthiolate.  When you remove an adhesive bandage, be sure not to harm the skin around it.  If you have a wound, look at it several times a day to make sure it is healing.  Do not use heating pads or hot water bottles. Burns can occur. If you have lost feeling in your feet or legs, you may not know it is happening until it is too late.  Report any cuts, sores or bruises to your caregiver. Do not wait! SEEK MEDICAL CARE IF:   You have an injury that is not healing or you notice redness, numbness, burning, or tingling.  Your feet always feel cold.  You have pain or cramps in your legs and feet. SEEK IMMEDIATE MEDICAL CARE IF:   There is increasing redness, swelling, or increasing pain in the wound.  There is a red line that goes up your leg.  Pus is coming  from a wound.  You develop an unexplained oral temperature above 102 F (38.9 C), or as your caregiver suggests.  You notice a bad smell coming from an ulcer or wound. MAKE SURE YOU:   Understand these instructions.  Will watch your condition.  Will get help right away if you are not doing well or get worse. Document Released: 01/18/2000 Document Revised: 04/14/2011 Document Reviewed: 07/26/2008 [facility] Patient Information 2014 [place], [place].

## 2012-11-29 ENCOUNTER — Encounter: Payer: Self-pay | Admitting: Gastroenterology

## 2012-12-02 ENCOUNTER — Ambulatory Visit: Payer: Self-pay | Admitting: Cardiovascular Disease

## 2012-12-03 ENCOUNTER — Encounter: Payer: Self-pay | Admitting: Cardiovascular Disease

## 2012-12-03 ENCOUNTER — Ambulatory Visit: Payer: Self-pay | Admitting: Cardiology

## 2012-12-03 ENCOUNTER — Ambulatory Visit (INDEPENDENT_AMBULATORY_CARE_PROVIDER_SITE_OTHER): Payer: Medicare Other | Admitting: Cardiovascular Disease

## 2012-12-03 VITALS — BP 133/67 | HR 70 | Ht 70.0 in | Wt 201.0 lb

## 2012-12-03 DIAGNOSIS — I10 Essential (primary) hypertension: Secondary | ICD-10-CM

## 2012-12-03 DIAGNOSIS — Z9861 Coronary angioplasty status: Secondary | ICD-10-CM

## 2012-12-03 DIAGNOSIS — Z7182 Exercise counseling: Secondary | ICD-10-CM

## 2012-12-03 DIAGNOSIS — I2510 Atherosclerotic heart disease of native coronary artery without angina pectoris: Secondary | ICD-10-CM

## 2012-12-03 DIAGNOSIS — E785 Hyperlipidemia, unspecified: Secondary | ICD-10-CM

## 2012-12-03 DIAGNOSIS — Z950 Presence of cardiac pacemaker: Secondary | ICD-10-CM | POA: Diagnosis not present

## 2012-12-03 DIAGNOSIS — I209 Angina pectoris, unspecified: Secondary | ICD-10-CM

## 2012-12-03 DIAGNOSIS — Z955 Presence of coronary angioplasty implant and graft: Secondary | ICD-10-CM

## 2012-12-03 DIAGNOSIS — I208 Other forms of angina pectoris: Secondary | ICD-10-CM

## 2012-12-03 DIAGNOSIS — Z713 Dietary counseling and surveillance: Secondary | ICD-10-CM

## 2012-12-03 MED ORDER — ATORVASTATIN CALCIUM 20 MG PO TABS: 20.0000 mg | ORAL_TABLET | Freq: Every day | ORAL | Status: DC

## 2012-12-03 NOTE — Addendum Note (Signed)
Addended by: Lisabeth Devoid F on: 12/03/2012 02:23 PM   Modules accepted: Orders

## 2012-12-03 NOTE — Progress Notes (Signed)
Patient ID: [name] [name], male   DOB: Sep 06, 1940, 72 y.o.   MRN: [id number]       CARDIOLOGY CONSULT NOTE  Patient ID: [name] [name] MRN: [id number] DOB/AGE: 1940-07-17 72 y.o.  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  He reportedly had a repeat cardiac cath on 04-19-2012 which revealed the following: 1. LAD with 50-60% hazy stenosis at the bifurcation of a diagonal branch. The diagonal itself has a mid 70% stenosis, but was a small caliber vessel. FFR of LAD was 0.85. 2. LCx: patent stent, proximal 30-40% stenosis. 3. RCA: diffuse 40-50% stenosis of the proximal 2/3, and an additional 50% stenosis after the PDA. 4. Left ventriculography: EF 50-55%.  An echocardiogram on 06-15-2009 reportedly showed an EF of 60%, and a nuclear stress test on 07/23/2009 reportedly was normal, with a stress EF of 78%.  He is not on a statin due to elevated transaminases in the past. 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  He has occasional chest discomfort, but this is seldom. It is associated on some occasions with mild shortness of breath, but relieved with rest. His symptoms prior to stent placement were "chest burning" and fatigue with walking/exertion.  SocHx: prior IV heroin use. Released from prison in 8/14. Lives with daughter in [place]. Quit smoking in 2005.   No Known Allergies  Current Outpatient Prescriptions  Medication Sig Dispense Refill    . aspirin EC 81 MG tablet Take 81 mg by mouth daily.      . diclofenac sodium (VOLTAREN) 1 % GEL Apply to right knee every 8 hours as needed for knee pain  100 g  0  . folic acid (FOLVITE) 1 MG tablet Take 1 tablet (1 mg total) by mouth daily.  30 tablet  1  . FOLIC ACID PO Take by mouth. Folic acid complex      . furosemide (LASIX) 40 MG tablet Take 40 mg by mouth daily.      . isosorbide mononitrate (IMDUR) 30 MG 24 hr tablet Take 3 tablets (90 mg total) by mouth daily.  90 tablet  1  . lisinopril (PRINIVIL,ZESTRIL) 2.5 MG tablet Take 1 tablet (2.5 mg total) by mouth daily.  30 tablet  1  . metoprolol (LOPRESSOR) 50 MG tablet Take 1 tablet (50 mg total) by mouth 2 (two) times daily.  60 tablet  1  . simvastatin (ZOCOR) 20 MG tablet Take 20 mg by mouth every evening.      . terazosin (HYTRIN) 5 MG capsule Take 1 capsule (5 mg total) by mouth every evening.  30 capsule  2  . Terbinafine HCl 1 % SOLN Apply to affected areas BID  30 mL  2  . mometasone (ASMANEX) 220 MCG/INH inhaler Inhale 2 puffs into the lungs daily.      . nitroGLYCERIN (NITROSTAT) 0.4 MG SL tablet Place 0.4 mg under the tongue every 5 (five) minutes as needed for chest  pain (x 3 times then call 911 if pain persists).      . nystatin (MYCOSTATIN/NYSTOP) 100000 UNIT/GM POWD Apply to both feet daily  60 g  0  . potassium chloride SA (K-DUR,KLOR-CON) 20 MEQ tablet Take 20 mEq by mouth daily.       No current facility-administered medications for this visit.    Past Medical History  Diagnosis Date  . Diabetes mellitus without complication   . Cataract   . Myocardial infarction 2009    one stent  . Hypertension   . Hyperlipidemia   . 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 Hepatitis C     previous IV DU and diagnosed in 1968    Past Surgical History  Procedure Laterality Date  . Coronary angioplasty with stent placement  2009    most recent cardiac cath 04/2012    History   Social History  .  Marital Status: Divorced    Spouse Name: N/A    Number of Children: N/A  . Years of Education: N/A   Occupational History  . Not on file.   Social History Main Topics  . Smoking status: Former Smoker    Types: Cigarettes    Quit date: 10/08/1994  . Smokeless tobacco: Not on file  . Alcohol Use: No     Comment: previous alcohol use-stopped 18 years ago before prison  . Drug Use: Yes     Comment: previous IVDU of heroin  . Sexual Activity: Not Currently   Other Topics Concern  . Not on file   Social History Narrative   Lives with daughter in [place], [place] and just recently got out of prison a month ago Aug 2014     No family history on file.   Prior to Admission medications   Medication Sig Start Date End Date Taking? Authorizing Provider  aspirin EC 81 MG tablet Take 81 mg by mouth daily.   Yes Historical Provider, MD  diclofenac sodium (VOLTAREN) 1 % GEL Apply to right knee every 8 hours as needed for knee pain 11/25/12  Yes Kela Millin, MD  folic acid (FOLVITE) 1 MG tablet Take 1 tablet (1 mg total) by mouth daily. 11/25/12  Yes Kela Millin, MD  FOLIC ACID PO Take by mouth. Folic acid complex   Yes Historical Provider, MD  furosemide (LASIX) 40 MG tablet Take 40 mg by mouth daily.   Yes Historical Provider, MD  isosorbide mononitrate (IMDUR) 30 MG 24 hr tablet Take 3 tablets (90 mg total) by mouth daily. 10/29/12  Yes Kela Millin, MD  lisinopril (PRINIVIL,ZESTRIL) 2.5 MG tablet Take 1 tablet (2.5 mg total) by mouth daily. 10/29/12  Yes Kela Millin, MD  metoprolol (LOPRESSOR) 50 MG tablet Take 1 tablet (50 mg total) by mouth 2 (two) times daily. 10/29/12  Yes Kela Millin, MD  simvastatin (ZOCOR) 20 MG tablet Take 20 mg by mouth every evening.   Yes Historical Provider, MD  terazosin (HYTRIN) 5 MG capsule Take 1 capsule (5 mg total) by mouth every evening. 11/25/12  Yes Kela Millin, MD  Terbinafine HCl 1 % SOLN Apply to affected areas BID  10/07/12  Yes Kela Millin, MD  mometasone [facility]) 220 MCG/INH inhaler Inhale 2 puffs into the lungs daily.    Historical Provider, MD  nitroGLYCERIN (NITROSTAT) 0.4 MG SL tablet Place 0.4 mg under the tongue every 5 (five) minutes as needed for chest pain (  x 3 times then call 911 if pain persists).    Historical Provider, MD  nystatin (MYCOSTATIN/NYSTOP) 100000 UNIT/GM POWD Apply to both feet daily 10/21/12   Kela Millin, MD  potassium chloride SA (K-DUR,KLOR-CON) 20 MEQ tablet Take 20 mEq by mouth daily.    Historical Provider, MD     Review of systems complete and found to be negative unless listed above in HPI     Physical exam Blood pressure 133/67, pulse 70, height 5\' 10"  (1.778 m), weight 201 lb (91.173 kg). General: NAD Neck: No JVD, no thyromegaly or thyroid nodule.  Lungs: Clear to auscultation bilaterally with normal respiratory effort. CV: Nondisplaced PMI.  Heart regular S1/S2, no S3/S4, no murmur.  No peripheral edema.  No carotid bruit.  Normal pedal pulses.  Abdomen: Soft, nontender, no hepatosplenomegaly, no distention.  Skin: Intact without lesions or rashes.  Neurologic: Alert and oriented x 3.  Psych: Normal affect. Extremities: No clubbing or cyanosis.  HEENT: Normal.   Labs:   Lab Results  Component Value Date   WBC 4.7 10/07/2012   HGB 13.0 10/07/2012   HCT 37.6* 10/07/2012   MCV 89.1 10/07/2012   PLT 99* 10/07/2012   No results found for this basename: NA, K, CL, CO2, BUN, CREATININE, CALCIUM, LABALBU, PROT, BILITOT, ALKPHOS, ALT, AST, GLUCOSE,  in the last 168 hours No results found for this basename: CKTOTAL, CKMB, CKMBINDEX, TROPONINI    Lab Results  Component Value Date   CHOL 124 10/07/2012   Lab Results  Component Value Date   HDL 38* 10/07/2012   Lab Results  Component Value Date   LDLCALC 54 10/07/2012   Lab Results  Component Value Date   TRIG 161* 10/07/2012   Lab Results  Component Value Date   CHOLHDL 3.3 10/07/2012   No results  found for this basename: LDLDIRECT       EKG: atrial paced rhythm  Studies: See HPI   ASSESSMENT AND PLAN: 1. CAD with LCx stent: he appears to be symptomatically stable. He is currently on ASA, Imdur 90 mg daily, metoprolol, and lisinopril. I will start low-dose Lipitor 20 mg daily for its pleiotropic cardioprotective/endothelial effects, and will check LFT's in 3 weeks. His lipids are actually quite good, with a low LDL. I counseled him on diet and exercise. He continues to have moderate CAD, particularly in the LAD, which I will monitor. No present indication for noninvasive testing. 2. HTN: controlled on present therapy. 3. Hyperlipidemia:  I will start low-dose Lipitor 20 mg daily for its pleiotropic cardioprotective/endothelial effects, and will check LFT's in 3 weeks. His lipids are actually quite good, with a low LDL. I counseled him on diet and exercise, and have provided him with literature regarding this. 4. Pacemaker: I will enroll him for regular f/u in our device clinic.   Signed: Prentice Docker, M.D., F.A.C.C.  12/03/2012, 9:23 AM

## 2012-12-03 NOTE — Patient Instructions (Addendum)
Your physician recommends that you schedule a follow-up appointment in: 6 months Your physician has recommended you make the following change in your medication:   START Lipitor 20 mg daily  Your physician recommends that you return for lab work in:  3 weeks lft's   Your physician recommends that you return for lab work in:  3 months lipid  You are receiving handout on Cardiac diet today

## 2012-12-09 NOTE — Addendum Note (Signed)
Addended by: Marlyn Corporal A on: 12/09/2012 09:16 AM   Modules accepted: Orders

## 2012-12-10 ENCOUNTER — Other Ambulatory Visit: Payer: Self-pay | Admitting: Gastroenterology

## 2012-12-10 ENCOUNTER — Ambulatory Visit (INDEPENDENT_AMBULATORY_CARE_PROVIDER_SITE_OTHER): Payer: Medicare Other | Admitting: Gastroenterology

## 2012-12-10 ENCOUNTER — Encounter: Payer: Self-pay | Admitting: Gastroenterology

## 2012-12-10 ENCOUNTER — Encounter (INDEPENDENT_AMBULATORY_CARE_PROVIDER_SITE_OTHER): Payer: Self-pay

## 2012-12-10 VITALS — BP 113/60 | HR 76 | Temp 98.0°F | Wt 201.8 lb

## 2012-12-10 DIAGNOSIS — K7460 Unspecified cirrhosis of liver: Secondary | ICD-10-CM

## 2012-12-10 DIAGNOSIS — Z1211 Encounter for screening for malignant neoplasm of colon: Secondary | ICD-10-CM | POA: Diagnosis not present

## 2012-12-10 DIAGNOSIS — Z791 Long term (current) use of non-steroidal anti-inflammatories (NSAID): Secondary | ICD-10-CM

## 2012-12-10 DIAGNOSIS — B1920 Unspecified viral hepatitis C without hepatic coma: Secondary | ICD-10-CM

## 2012-12-10 DIAGNOSIS — D638 Anemia in other chronic diseases classified elsewhere: Secondary | ICD-10-CM | POA: Diagnosis not present

## 2012-12-10 DIAGNOSIS — B182 Chronic viral hepatitis C: Secondary | ICD-10-CM | POA: Diagnosis not present

## 2012-12-10 DIAGNOSIS — D649 Anemia, unspecified: Secondary | ICD-10-CM

## 2012-12-10 LAB — PROTIME-INR: INR: 1.03 (ref <1.50)

## 2012-12-10 MED ORDER — PEG-KCL-NACL-NASULF-NA ASC-C 100 G PO SOLR: 1.0000 | ORAL | Status: DC

## 2012-12-10 MED ORDER — OMEPRAZOLE 20 MG PO CPDR: DELAYED_RELEASE_CAPSULE | ORAL | Status: DC

## 2012-12-10 NOTE — Assessment & Plan Note (Signed)
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  EGD TO ASSESS FOR PORTAL GASTROPATHY OR H PYLORI GASTRITIS

## 2012-12-10 NOTE — Assessment & Plan Note (Addendum)
ASSOCIATED WITH LOW PLTS SUGGESTS PORTAL htn/? CIRRHOSIS.  RUQ U/S NEXT WEEK ? EGD IN 2 WEEKS TO ASSESS FOR VARICES HAV/HBV SEROLOGY/AFP TODAY OPV IN 6 MOS

## 2012-12-10 NOTE — Assessment & Plan Note (Signed)
PT ON ASA DAILY WITH PLT CT 99K & HB 13.  PPI DAILY. DISCUSSED BENEFITS OF PPI RX WITH PT AND DAUGHTER.
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
## 2012-12-10 NOTE — Assessment & Plan Note (Signed)
BROTHER HAD POLYPS.  TCS IN 2 WEEKS

## 2012-12-10 NOTE — Patient Instructions (Signed)
CONTINUE OMEPRAZOLE.  TAKE WITH YOUR FIRST MEAL DAILY.  GET LABS DRAWN.  COMPLETE ULTRASOUND NEXT WEEK.  UPPER AND LOWER ENDOSCOPY IN 2 WEEKS.  FOLLOW UP IN 6 MOS. MERRY CHRISTMAS AND HAPPY NEW YEAR!

## 2012-12-11 LAB — HEPATITIS B SURFACE ANTIBODY,QUALITATIVE: Hep B S Ab: POSITIVE — AB

## 2012-12-11 LAB — HEPATITIS A ANTIBODY, TOTAL: Hep A Total Ab: REACTIVE — AB

## 2012-12-11 LAB — AFP TUMOR MARKER: AFP-Tumor Marker: 10.9 ng/mL — ABNORMAL HIGH (ref 0.0–8.0)

## 2012-12-13 NOTE — Progress Notes (Signed)
cc'd to pcp 

## 2012-12-15 ENCOUNTER — Ambulatory Visit (HOSPITAL_COMMUNITY)
Admission: RE | Admit: 2012-12-15 | Discharge: 2012-12-15 | Disposition: A | Discharge status: Home / Self Care | Payer: Medicare Other | Source: Ambulatory Visit | Attending: Gastroenterology | Admitting: Gastroenterology

## 2012-12-15 DIAGNOSIS — K7460 Unspecified cirrhosis of liver: Secondary | ICD-10-CM | POA: Diagnosis not present

## 2012-12-15 DIAGNOSIS — K7689 Other specified diseases of liver: Secondary | ICD-10-CM | POA: Diagnosis not present

## 2012-12-15 DIAGNOSIS — B1920 Unspecified viral hepatitis C without hepatic coma: Secondary | ICD-10-CM | POA: Insufficient documentation

## 2012-12-15 DIAGNOSIS — B182 Chronic viral hepatitis C: Secondary | ICD-10-CM

## 2012-12-23 ENCOUNTER — Telehealth: Payer: Self-pay

## 2012-12-23 NOTE — Telephone Encounter (Signed)
Pt's daughter called and asked if pt needs more labs prior to his TCS on 01/03/2013. Please advise!

## 2012-12-24 ENCOUNTER — Ambulatory Visit (INDEPENDENT_AMBULATORY_CARE_PROVIDER_SITE_OTHER): Payer: Medicare Other | Admitting: *Deleted

## 2012-12-24 DIAGNOSIS — Z23 Encounter for immunization: Secondary | ICD-10-CM

## 2012-12-27 DIAGNOSIS — B182 Chronic viral hepatitis C: Secondary | ICD-10-CM | POA: Insufficient documentation

## 2012-12-27 DIAGNOSIS — Z23 Encounter for immunization: Secondary | ICD-10-CM

## 2012-12-27 NOTE — Telephone Encounter (Signed)
REVIEWED.  

## 2012-12-27 NOTE — Addendum Note (Signed)
Addended by: Rolena Infante on: 12/27/2012 01:48 PM   Modules accepted: Orders

## 2012-12-27 NOTE — Progress Notes (Addendum)
PLEASE CALL PT. 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 HE DOES NOT NEED THE IMMUNIZATION SHOTS. HIS TUMOR MARKER IS SLIGHTLY ELEVATED AT 10.9 BUT THIS CAN BEE SEEN IN HEPATITIS C. HIS U/S SHOWS CIRRHOSIS BUT NO LIVER TUMOR. HE NEEDS LABS TWICE A YEAR AND AN U/S ONCE A YEAR. EGD/TCS DEC 1.

## 2012-12-28 ENCOUNTER — Encounter (HOSPITAL_COMMUNITY): Payer: Self-pay | Admitting: Pharmacy Technician

## 2012-12-28 NOTE — Progress Notes (Signed)
LM for a return call.  

## 2012-12-28 NOTE — Progress Notes (Signed)
Cc PCP 

## 2013-01-03 ENCOUNTER — Encounter (HOSPITAL_COMMUNITY): Payer: Self-pay | Admitting: *Deleted

## 2013-01-03 ENCOUNTER — Encounter (HOSPITAL_COMMUNITY)
Admission: RE | Disposition: A | Discharge status: Home / Self Care | Payer: Self-pay | Source: Ambulatory Visit | Attending: Gastroenterology

## 2013-01-03 ENCOUNTER — Ambulatory Visit (HOSPITAL_COMMUNITY)
Admission: RE | Admit: 2013-01-03 | Discharge: 2013-01-03 | Disposition: A | Discharge status: Home / Self Care | Payer: Medicare Other | Source: Ambulatory Visit | Attending: Gastroenterology | Admitting: Gastroenterology

## 2013-01-03 DIAGNOSIS — K2940 Chronic atrophic gastritis without bleeding: Secondary | ICD-10-CM | POA: Diagnosis not present

## 2013-01-03 DIAGNOSIS — K2970 Gastritis, unspecified, without bleeding: Secondary | ICD-10-CM | POA: Diagnosis not present

## 2013-01-03 DIAGNOSIS — D126 Benign neoplasm of colon, unspecified: Secondary | ICD-10-CM | POA: Diagnosis not present

## 2013-01-03 DIAGNOSIS — K5730 Diverticulosis of large intestine without perforation or abscess without bleeding: Secondary | ICD-10-CM | POA: Diagnosis not present

## 2013-01-03 DIAGNOSIS — Z8371 Family history of colonic polyps: Secondary | ICD-10-CM | POA: Insufficient documentation

## 2013-01-03 DIAGNOSIS — A048 Other specified bacterial intestinal infections: Secondary | ICD-10-CM | POA: Insufficient documentation

## 2013-01-03 DIAGNOSIS — E119 Type 2 diabetes mellitus without complications: Secondary | ICD-10-CM | POA: Diagnosis not present

## 2013-01-03 DIAGNOSIS — K620 Anal polyp: Secondary | ICD-10-CM | POA: Diagnosis not present

## 2013-01-03 DIAGNOSIS — K648 Other hemorrhoids: Secondary | ICD-10-CM | POA: Diagnosis not present

## 2013-01-03 DIAGNOSIS — I10 Essential (primary) hypertension: Secondary | ICD-10-CM | POA: Diagnosis not present

## 2013-01-03 DIAGNOSIS — D649 Anemia, unspecified: Secondary | ICD-10-CM

## 2013-01-03 DIAGNOSIS — K2990 Gastroduodenitis, unspecified, without bleeding: Secondary | ICD-10-CM

## 2013-01-03 DIAGNOSIS — B1920 Unspecified viral hepatitis C without hepatic coma: Secondary | ICD-10-CM

## 2013-01-03 DIAGNOSIS — Z83719 Family history of colon polyps, unspecified: Secondary | ICD-10-CM | POA: Insufficient documentation

## 2013-01-03 DIAGNOSIS — K449 Diaphragmatic hernia without obstruction or gangrene: Secondary | ICD-10-CM | POA: Insufficient documentation

## 2013-01-03 DIAGNOSIS — Z01812 Encounter for preprocedural laboratory examination: Secondary | ICD-10-CM | POA: Insufficient documentation

## 2013-01-03 DIAGNOSIS — Z7982 Long term (current) use of aspirin: Secondary | ICD-10-CM | POA: Diagnosis not present

## 2013-01-03 DIAGNOSIS — K7460 Unspecified cirrhosis of liver: Secondary | ICD-10-CM | POA: Insufficient documentation

## 2013-01-03 HISTORY — PX: COLONOSCOPY WITH ESOPHAGOGASTRODUODENOSCOPY (EGD): SHX5779

## 2013-01-03 LAB — GLUCOSE, CAPILLARY: Glucose-Capillary: 161 mg/dL — ABNORMAL HIGH (ref 70–99)

## 2013-01-03 LAB — FERRITIN: Ferritin: 93 ng/mL (ref 22–322)

## 2013-01-03 SURGERY — COLONOSCOPY WITH ESOPHAGOGASTRODUODENOSCOPY (EGD)
Anesthesia: Moderate Sedation

## 2013-01-03 MED ORDER — MIDAZOLAM HCL 5 MG/5ML IJ SOLN
INTRAMUSCULAR | Status: AC
  Filled 2013-01-03: qty 10

## 2013-01-03 MED ORDER — STERILE WATER FOR IRRIGATION IR SOLN
Status: DC | PRN
  Administered 2013-01-03: 10:00:00

## 2013-01-03 MED ORDER — SODIUM CHLORIDE 0.9 % IV SOLN
INTRAVENOUS | Status: DC
  Administered 2013-01-03: 10:00:00 via INTRAVENOUS

## 2013-01-03 MED ORDER — MEPERIDINE HCL 100 MG/ML IJ SOLN
INTRAMUSCULAR | Status: AC
  Filled 2013-01-03: qty 2

## 2013-01-03 MED ORDER — MEPERIDINE HCL 100 MG/ML IJ SOLN
INTRAMUSCULAR | Status: DC | PRN
  Administered 2013-01-03 (×2): 25 mg via INTRAVENOUS

## 2013-01-03 MED ORDER — MIDAZOLAM HCL 5 MG/5ML IJ SOLN
INTRAMUSCULAR | Status: DC | PRN
  Administered 2013-01-03 (×3): 2 mg via INTRAVENOUS
  Administered 2013-01-03: 1 mg via INTRAVENOUS

## 2013-01-03 NOTE — Op Note (Signed)
[facility] [address] [place], [address]   COLONOSCOPY PROCEDURE REPORT  PATIENT: [name] [name], [name] [name]  MR#: [id number] BIRTHDATE: 1940/04/29 , 72  yrs. old GENDER: Male ENDOSCOPIST: Jonette Eva, MD REFERRED BY:   Kela Millin, MD-[facility] PROCEDURE DATE:  01/03/2013 PROCEDURE:   Colonoscopy with snare polypectomy INDICATIONS:Anemia, non-specific.  BROTHER HAD POLYPS. MEDICATIONS: Versed 5 mg IV and Demerol 50 mg IV  DESCRIPTION OF PROCEDURE:    Physical exam was performed.  Informed consent was obtained from the patient after explaining the benefits, risks, and alternatives to procedure.  The patient was connected to monitor and placed in left lateral position. Continuous oxygen was provided by nasal cannula and IV medicine administered through an indwelling cannula.  After administration of sedation and rectal exam, the patients rectum was intubated and the EC-[id number] ([id number])  colonoscope was advanced under direct visualization to the ileum.  The scope was removed slowly by carefully examining the color, texture, anatomy, and integrity mucosa on the way out.  The patient was recovered in endoscopy and discharged home in satisfactory condition.    COLON FINDINGS: Three polyps measuring 5-6 mm in size were found in the rectum and at the hepatic flexure.  A polypectomy was performed using snare cautery.  , Mild diverticulosis was noted in the ascending colon, transverse colon, and descending colon.  , There was moderate diverticulosis noted in the sigmoid colon with associated muscular hypertrophy.  , RECTAL VARICES, Small internal hemorrhoids were found.  , and The mucosa appeared normal in the terminal ileum.  PREP QUALITY: good.   CECAL W/D TIME: 16 minutes     COMPLICATIONS: None  ENDOSCOPIC IMPRESSION: 1.   Three COLON POLYPS REMOVED 2.   Mild diverticulosis in the ascending colon, transverse colon, and descending  colon 3.   Moderate diverticulosis noted in the sigmoid colon 4.   RECTAL VARICES. 5.   Small internal hemorrhoids   RECOMMENDATIONS: CONTINUE OMEPRAZOLE. AVOID ITEMS THAT TRIGGER GASTRITIS. FOLLOW A HIGH FIBER/LOW FAT DIET.  AVOID ITEMS THAT CAUSE BLOATING.  BIOPSY RESULTS WILL BE BACK IN 7 DAYS.  FOLLOW UP IN MAY 2015. Next colonoscopy in 5-10 years IF THE BENEFITS OUTWEIGH THE RISKS.       _______________________________ Rosalie DoctorJonette Eva, MD 01/03/2013 11:15 AM     PATIENT NAME:  [name] [name] MR#: [id number]

## 2013-01-03 NOTE — Progress Notes (Signed)
Ferritin drawn and sent to lab for results. 

## 2013-01-03 NOTE — Op Note (Signed)
[facility] [address] [place], [address]   ENDOSCOPY PROCEDURE REPORT  PATIENT: [name] [name] [name]  MR#: [id number] BIRTHDATE: 1940/03/06 , 72  yrs. old GENDER: Male  ENDOSCOPIST: Jonette Eva, MD REFERRED BY:   Kela Millin, MD PROCEDURE DATE: 01/03/2013 PROCEDURE:   EGD w/ biopsy  INDICATIONS:Screening for varices-HB 13 PLT MCV 89.1 99K. PMHx: CIRRHOSIS-SEP-NOV 2014 CHILD PUGH A/MELD 7 MEDICATIONS: TCS + Versed 1mg  IV TOPICAL ANESTHETIC:   Cetacaine Spray  DESCRIPTION OF PROCEDURE:     Physical exam was performed.  Informed consent was obtained from the patient after explaining the benefits, risks, and alternatives to the procedure.  The patient was connected to the monitor and placed in the left lateral position.  Continuous oxygen was provided by nasal cannula and IV medicine administered through an indwelling cannula.  After administration of sedation, the patients esophagus was intubated and the [id number] ([id number])  endoscope was advanced under direct visualization to the second portion of the duodenum.  The scope was removed slowly by carefully examining the color, texture, anatomy, and integrity of the mucosa on the way out.  The patient was recovered in endoscopy and discharged home in satisfactory condition.   ESOPHAGUS: The mucosa of the esophagus appeared normal.   A small hiatal hernia was noted.   STOMACH: Moderate non-erosive gastritis (inflammation) was found in the gastric antrum.  Multiple biopsies were performed using cold forceps.   DUODENUM: The duodenal mucosa showed no abnormalities in the bulb and second portion of the duodenum.  COMPLICATIONS:   None  ENDOSCOPIC IMPRESSION: 1.   Small hiatal hernia 3.   MODERATE Non-erosive gastritis 4.   NORMOCYTIC ANEMAI MOST LIKELY DUE TO CHRONIC DISEASE/GASTRITIS  RECOMMENDATIONS: FERRITIN TODAY. CONTINUE OMEPRAZOLE. AVOID ITEMS THAT TRIGGER GASTRITIS. FOLLOW A HIGH FIBER/LOW FAT  DIET.  AVOID ITEMS THAT CAUSE BLOATING.  BIOPSY RESULTS WILL BE BACK IN 7 DAYS.  FOLLOW UP IN MAY 2015. REPEAT EGD IN 3 YEARS. Next colonoscopy in 5-10 years IF THE BENEFITS OUTWEIGH THE RISKS.   REPEAT EXAM:   _______________________________ Rosalie DoctorJonette Eva, MD 01/03/2013 11:38 AM       PATIENT NAME:  [name], [name] [name] MR#: [id number]

## 2013-01-07 ENCOUNTER — Encounter (HOSPITAL_COMMUNITY): Payer: Self-pay | Admitting: Gastroenterology

## 2013-01-09 ENCOUNTER — Emergency Department (HOSPITAL_COMMUNITY): Payer: Medicare Other

## 2013-01-09 ENCOUNTER — Encounter (HOSPITAL_COMMUNITY): Payer: Self-pay | Admitting: Emergency Medicine

## 2013-01-09 ENCOUNTER — Emergency Department (HOSPITAL_COMMUNITY)
Admission: EM | Admit: 2013-01-09 | Discharge: 2013-01-09 | Disposition: A | Discharge status: Home / Self Care | Payer: Medicare Other | Attending: Emergency Medicine | Admitting: Emergency Medicine

## 2013-01-09 DIAGNOSIS — Z8619 Personal history of other infectious and parasitic diseases: Secondary | ICD-10-CM | POA: Diagnosis not present

## 2013-01-09 DIAGNOSIS — Z87891 Personal history of nicotine dependence: Secondary | ICD-10-CM | POA: Insufficient documentation

## 2013-01-09 DIAGNOSIS — Z79899 Other long term (current) drug therapy: Secondary | ICD-10-CM | POA: Diagnosis not present

## 2013-01-09 DIAGNOSIS — Z8669 Personal history of other diseases of the nervous system and sense organs: Secondary | ICD-10-CM | POA: Diagnosis not present

## 2013-01-09 DIAGNOSIS — R630 Anorexia: Secondary | ICD-10-CM | POA: Diagnosis not present

## 2013-01-09 DIAGNOSIS — Z9861 Coronary angioplasty status: Secondary | ICD-10-CM | POA: Insufficient documentation

## 2013-01-09 DIAGNOSIS — I252 Old myocardial infarction: Secondary | ICD-10-CM | POA: Diagnosis not present

## 2013-01-09 DIAGNOSIS — Z792 Long term (current) use of antibiotics: Secondary | ICD-10-CM | POA: Insufficient documentation

## 2013-01-09 DIAGNOSIS — Z7982 Long term (current) use of aspirin: Secondary | ICD-10-CM | POA: Insufficient documentation

## 2013-01-09 DIAGNOSIS — IMO0002 Reserved for concepts with insufficient information to code with codable children: Secondary | ICD-10-CM | POA: Diagnosis not present

## 2013-01-09 DIAGNOSIS — R52 Pain, unspecified: Secondary | ICD-10-CM | POA: Diagnosis not present

## 2013-01-09 DIAGNOSIS — J029 Acute pharyngitis, unspecified: Secondary | ICD-10-CM | POA: Insufficient documentation

## 2013-01-09 DIAGNOSIS — I10 Essential (primary) hypertension: Secondary | ICD-10-CM | POA: Diagnosis not present

## 2013-01-09 DIAGNOSIS — J441 Chronic obstructive pulmonary disease with (acute) exacerbation: Secondary | ICD-10-CM | POA: Diagnosis not present

## 2013-01-09 DIAGNOSIS — Z9889 Other specified postprocedural states: Secondary | ICD-10-CM | POA: Insufficient documentation

## 2013-01-09 DIAGNOSIS — E119 Type 2 diabetes mellitus without complications: Secondary | ICD-10-CM | POA: Insufficient documentation

## 2013-01-09 DIAGNOSIS — Z950 Presence of cardiac pacemaker: Secondary | ICD-10-CM | POA: Insufficient documentation

## 2013-01-09 DIAGNOSIS — J40 Bronchitis, not specified as acute or chronic: Secondary | ICD-10-CM | POA: Diagnosis not present

## 2013-01-09 DIAGNOSIS — E785 Hyperlipidemia, unspecified: Secondary | ICD-10-CM | POA: Diagnosis not present

## 2013-01-09 MED ORDER — ALBUTEROL SULFATE HFA 108 (90 BASE) MCG/ACT IN AERS
2.0000 | INHALATION_SPRAY | Freq: Once | RESPIRATORY_TRACT | Status: AC
  Administered 2013-01-09: 2 via RESPIRATORY_TRACT
  Filled 2013-01-09: qty 6.7

## 2013-01-09 MED ORDER — PREDNISONE 10 MG PO TABS: 20.0000 mg | ORAL_TABLET | Freq: Every day | ORAL | Status: DC

## 2013-01-09 MED ORDER — PREDNISONE 50 MG PO TABS
60.0000 mg | ORAL_TABLET | Freq: Once | ORAL | Status: AC
  Administered 2013-01-09: 60 mg via ORAL
  Filled 2013-01-09 (×2): qty 1

## 2013-01-09 MED ORDER — BENZONATATE 100 MG PO CAPS: 100.0000 mg | ORAL_CAPSULE | Freq: Three times a day (TID) | ORAL | Status: DC

## 2013-01-09 MED ORDER — LEVOFLOXACIN 500 MG PO TABS: 500.0000 mg | ORAL_TABLET | Freq: Every day | ORAL | Status: DC

## 2013-01-09 NOTE — ED Notes (Addendum)
Pt states he had EGD on Monday. States afterwards he developed a cough, described as productive and frothy white. States burning and irritation to the chest, especially after coughing since the procedure. also complains of chills and body aches.
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
## 2013-01-10 ENCOUNTER — Ambulatory Visit (INDEPENDENT_AMBULATORY_CARE_PROVIDER_SITE_OTHER): Payer: Medicare Other | Admitting: Internal Medicine

## 2013-01-10 ENCOUNTER — Encounter: Payer: Self-pay | Admitting: Internal Medicine

## 2013-01-10 VITALS — BP 113/42 | HR 70 | Ht 70.0 in | Wt 201.0 lb

## 2013-01-10 DIAGNOSIS — I498 Other specified cardiac arrhythmias: Secondary | ICD-10-CM

## 2013-01-10 DIAGNOSIS — Z950 Presence of cardiac pacemaker: Secondary | ICD-10-CM

## 2013-01-10 LAB — MDC_IDC_ENUM_SESS_TYPE_INCLINIC
Battery Impedance: 272 Ohm
Battery Remaining Longevity: 114 mo
Battery Voltage: 2.8 V
Brady Statistic AS VS Percent: 15 %
Date Time Interrogation Session: 20141208085042
Lead Channel Impedance Value: 450 Ohm
Lead Channel Impedance Value: 536 Ohm
Lead Channel Pacing Threshold Amplitude: 1 V
Lead Channel Pacing Threshold Pulse Width: 0.76 ms
Lead Channel Setting Pacing Amplitude: 2.5 V
Lead Channel Setting Pacing Pulse Width: 0.76 ms
Lead Channel Setting Sensing Sensitivity: 2 mV
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
## 2013-01-10 NOTE — Progress Notes (Signed)
Called and informed pt.  

## 2013-01-10 NOTE — Patient Instructions (Signed)
Your physician recommends that you schedule a follow-up appointment in: 1 year with [facility] will receive a reminder letter two months in advance reminding you to call and schedule your appointment. If you don't receive this letter, please contact our office.  [facility] April 13, 2013.

## 2013-01-11 ENCOUNTER — Telehealth: Payer: Self-pay | Admitting: Gastroenterology

## 2013-01-11 ENCOUNTER — Encounter: Payer: Self-pay | Admitting: Internal Medicine

## 2013-01-11 MED ORDER — CLARITHROMYCIN 500 MG PO TABS: ORAL_TABLET | ORAL | Status: DC

## 2013-01-11 MED ORDER — AMOXICILLIN 500 MG PO TABS: ORAL_TABLET | ORAL | Status: DC

## 2013-01-11 NOTE — Telephone Encounter (Signed)
Pt's daughter, Kaipo Ardis returned call. Pt spoke to me and told me it is OK to give any results to her, she understands better than he does.   Then I spoke with [name] and informed her of the results and medications, etc. She seemed to understand but I also printed the info to mail to her.

## 2013-01-11 NOTE — Telephone Encounter (Signed)
LMOM to call.

## 2013-01-11 NOTE — Telephone Encounter (Signed)
Please call pt. 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 HIS stomach Bx showed H. Pylori infection. HE SHOULD START ABX FOR H PYLORI ONE WEEK AFTER STOPPING HIS LEVAQUIN. He needs AMOXICILLIN 500 mg 2 po BID for 10 days and Biaxin 500 mg po bid for 10 days.Marland Kitchen TAKE Omeprazole 20 mg BID for 10 days then 1 po dAILY forEVER. DO NOT TAKE LIPITOR WHILE TAKING THE ANTIBIOTICS. Med side effects include NVD, abd pain, and metallic taste. OPV MAY 2015 E30 CIRRHOSIS.

## 2013-01-19 NOTE — Telephone Encounter (Signed)
Reminder in epic °

## 2013-01-31 ENCOUNTER — Encounter: Payer: Self-pay | Admitting: Family Medicine

## 2013-01-31 ENCOUNTER — Ambulatory Visit (INDEPENDENT_AMBULATORY_CARE_PROVIDER_SITE_OTHER): Payer: Medicare Other | Admitting: Family Medicine

## 2013-01-31 ENCOUNTER — Other Ambulatory Visit: Payer: Self-pay | Admitting: *Deleted

## 2013-01-31 ENCOUNTER — Ambulatory Visit (HOSPITAL_COMMUNITY)
Admission: RE | Admit: 2013-01-31 | Discharge: 2013-01-31 | Disposition: A | Discharge status: Home / Self Care | Payer: Medicare Other | Source: Ambulatory Visit | Attending: Family Medicine | Admitting: Family Medicine

## 2013-01-31 VITALS — BP 128/76 | HR 68 | Temp 97.9°F | Resp 20 | Ht 68.0 in | Wt 197.5 lb

## 2013-01-31 DIAGNOSIS — A048 Other specified bacterial intestinal infections: Secondary | ICD-10-CM | POA: Diagnosis not present

## 2013-01-31 DIAGNOSIS — M25469 Effusion, unspecified knee: Secondary | ICD-10-CM | POA: Insufficient documentation

## 2013-01-31 DIAGNOSIS — M25569 Pain in unspecified knee: Secondary | ICD-10-CM | POA: Diagnosis not present

## 2013-01-31 DIAGNOSIS — L98499 Non-pressure chronic ulcer of skin of other sites with unspecified severity: Secondary | ICD-10-CM

## 2013-01-31 DIAGNOSIS — E119 Type 2 diabetes mellitus without complications: Secondary | ICD-10-CM | POA: Diagnosis not present

## 2013-01-31 DIAGNOSIS — I25810 Atherosclerosis of coronary artery bypass graft(s) without angina pectoris: Secondary | ICD-10-CM

## 2013-01-31 DIAGNOSIS — IMO0002 Reserved for concepts with insufficient information to code with codable children: Secondary | ICD-10-CM

## 2013-01-31 DIAGNOSIS — M1711 Unilateral primary osteoarthritis, right knee: Secondary | ICD-10-CM

## 2013-01-31 DIAGNOSIS — M1710 Unilateral primary osteoarthritis, unspecified knee: Secondary | ICD-10-CM | POA: Diagnosis not present

## 2013-01-31 DIAGNOSIS — L97509 Non-pressure chronic ulcer of other part of unspecified foot with unspecified severity: Secondary | ICD-10-CM | POA: Insufficient documentation

## 2013-01-31 DIAGNOSIS — J42 Unspecified chronic bronchitis: Secondary | ICD-10-CM

## 2013-01-31 HISTORY — DX: Other specified bacterial intestinal infections: A04.8

## 2013-01-31 LAB — COMPREHENSIVE METABOLIC PANEL
ALT: 121 U/L — ABNORMAL HIGH (ref 0–53)
AST: 96 U/L — ABNORMAL HIGH (ref 0–37)
Albumin: 3.7 g/dL (ref 3.5–5.2)
CO2: 24 mEq/L (ref 19–32)
Calcium: 9 mg/dL (ref 8.4–10.5)
Chloride: 104 mEq/L (ref 96–112)
Creat: 0.74 mg/dL (ref 0.50–1.35)
Potassium: 4.8 mEq/L (ref 3.5–5.3)
Sodium: 134 mEq/L — ABNORMAL LOW (ref 135–145)
Total Bilirubin: 1 mg/dL (ref 0.3–1.2)
Total Protein: 6.8 g/dL (ref 6.0–8.3)

## 2013-01-31 LAB — LIPID PANEL
LDL Cholesterol: 43 mg/dL (ref 0–99)
Triglycerides: 75 mg/dL (ref <150)
VLDL: 15 mg/dL (ref 0–40)

## 2013-01-31 LAB — C-REACTIVE PROTEIN: CRP: <0.5 mg/dL (ref <0.60)

## 2013-01-31 LAB — HEMOGLOBIN A1C
Hgb A1c MFr Bld: 8.4 % — ABNORMAL HIGH (ref <5.7)
Mean Plasma Glucose: 194 mg/dL — ABNORMAL HIGH (ref <117)

## 2013-01-31 MED ORDER — GLYBURIDE 2.5 MG PO TABS: 2.5000 mg | ORAL_TABLET | Freq: Two times a day (BID) | ORAL | Status: DC

## 2013-01-31 MED ORDER — IPRATROPIUM BROMIDE HFA 17 MCG/ACT IN AERS: 2.0000 | INHALATION_SPRAY | Freq: Four times a day (QID) | RESPIRATORY_TRACT | Status: DC

## 2013-01-31 MED ORDER — METOPROLOL TARTRATE 50 MG PO TABS: 50.0000 mg | ORAL_TABLET | Freq: Two times a day (BID) | ORAL | Status: DC

## 2013-01-31 NOTE — Progress Notes (Signed)
Subjective:    Patient ID: [name] [name], male    DOB: 1940-02-28, 72 y.o.   MRN: [id number]  Diabetes He presents for his follow-up diabetic visit. He has type 2 diabetes mellitus. Pertinent negatives for hypoglycemia include no confusion, dizziness, headaches, nervousness/anxiousness or sleepiness. Pertinent negatives for diabetes include no blurred vision, no chest pain, no fatigue, no polydipsia, no polyuria, no visual change and no weight loss. Risk factors for coronary artery disease include diabetes mellitus, dyslipidemia and male sex. Current diabetic treatment includes oral agent (monotherapy). He is compliant with treatment all of the time. He is following a diabetic diet. He rarely participates in exercise. His breakfast blood glucose is taken between 6-7 am. His breakfast blood glucose range is generally >200 mg/dl. An ACE inhibitor/angiotensin II receptor blocker is being taken. He does not see a podiatrist.Eye exam is current.  [name] [name] also had complaints of a rash that was on his right foot. He says it was there since he was in prison for many years. He says the lesion use to itch and he was told it was a fungal infection. He says it never really resolved but did get better. I also seen him for this in the past and was given powder for his foot. He says it helped at one point but now he says it is making the lesion dry.  He says there is some pain to the top of his foot as well and sometimes, he'll have numbness to the area. He says the area hasn't spread at all, but it's bothering him more. He also has some pain in this 3rd and 4th toes, rated 5/10.   He has seen GI and had EGD and colonoscopy done. He had hyperplastic adenoma removed and is still awaiting path. He was also given rx for H pylori infection.  He hasn't taken it yet and wanted to wait to talk to his PCP first. He was instructed to not take his lipitor while on these medicines.   Knee Pain: Patient presents for follow up on  a knee problem involving the  right knee. Onset of the symptoms was several months ago. Inciting event: none known. Current symptoms include crepitus sensation, giving out, stiffness and swelling. Pain is aggravated by any weight bearing and going up and down stairs.  Patient has had prior knee problems. Evaluation to date: none. Treatment to date: prescription NSAIDS which are somewhat effective and rest.  COPD: Patient complains of cough and increased sputum. Symptoms began several months ago. Symptoms chronic dyspnea and cough productive of clear sputum in small amounts does not worsen with exertion. Sputum is clear in small amounts. Fever has been chills. Patient uses 1 pillows at night. Patient can walk unknown feet before resting. 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 He says he has to use the inhaler daily. He use to smoke but no longer smokes.     Past Medical History  Diagnosis Date  . Diabetes mellitus without complication   . Cataract   . Myocardial infarction 2009    one stent  . Hypertension   . Hyperlipidemia   . 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 atorvastatin (LIPITOR) 20 MG tablet Take 1 tablet (20 mg total) by mouth daily.  90 tablet  3  . folic acid (FOLVITE) 1 MG tablet Take 1 tablet (1 mg total) by mouth daily.  30 tablet  1  . glyBURIDE (DIABETA) 2.5 MG tablet Take 2.5-5 mg by mouth 2 (two) times daily. Take 2 tablets in am and 1 tablet in pm      . isosorbide mononitrate (IMDUR) 30 MG 24 hr tablet Take 30-60 mg by mouth 3 (three) times daily. Patient takes 2 tablets in the morning and 1 tablet at night      . lisinopril (PRINIVIL,ZESTRIL) 2.5  MG tablet Take 1 tablet (2.5 mg total) by mouth daily.  30 tablet  1  . metoprolol (LOPRESSOR) 50 MG tablet Take 1 tablet (50 mg total) by mouth 2 (two) times daily.  60 tablet  1  . omeprazole (PRILOSEC) 20 MG capsule Take 20 mg by mouth daily.      Bertram Gala Glycol-Propyl Glycol (SYSTANE ULTRA OP) Place 1 drop into both eyes daily as needed (Dry Eyes).      . terazosin (HYTRIN) 5 MG capsule Take 1 capsule (5 mg total) by mouth every evening.  30 capsule  2  . amoxicillin (AMOXIL) 500 MG tablet 2 PO BID FOR 10 DAYS  40 tablet  0  . aspirin EC 81 MG tablet Take 81 mg by mouth daily.      . benzonatate (TESSALON) 100 MG capsule Take 1 capsule (100 mg total) by mouth every 8 (eight) hours.  21 capsule  0  . clarithromycin (BIAXIN) 500 MG tablet 1 PO BID FOR 10 DAYS.  20 tablet  0  . levofloxacin (LEVAQUIN) 500 MG tablet Take 1 tablet (500 mg total) by mouth daily.  10 tablet  0  . predniSONE (DELTASONE) 10 MG tablet Take 2 tablets (20 mg total) by mouth daily.  10 tablet  0   No current facility-administered medications on file prior to visit.   Review of Systems  Constitutional: Negative for chills, weight loss, activity change, fatigue and unexpected weight change.  HENT: Negative for congestion, sinus pressure and voice change.   Eyes: Negative for blurred vision, pain and visual disturbance.  Respiratory: Positive for cough and shortness of breath. Negative for choking, chest tightness and wheezing.   Cardiovascular: Negative for chest pain and palpitations.  Gastrointestinal: Negative for nausea, vomiting, abdominal pain, diarrhea and constipation.  Endocrine: Negative for polydipsia and polyuria.  Genitourinary: Negative for dysuria.  Musculoskeletal: Positive for gait problem and joint swelling. Negative for back pain.       Right knee pain, chronic Right foot pain and numbness  Skin: Positive for color change and rash.       Nonhealing rash  Allergic/Immunologic: Positive for  immunocompromised state. Negative for environmental allergies.       Hx of hep C  Neurological: Positive for numbness. Negative for dizziness and headaches.  Hematological: Negative for adenopathy. Does not bruise/bleed easily.  Psychiatric/Behavioral: Negative for behavioral problems, confusion and agitation. The patient is not nervous/anxious.        Objective:   Physical Exam  Nursing note and vitals reviewed. Constitutional: He is oriented to person, place, and time. He appears well-developed and well-nourished.  HENT:  Head: Normocephalic and atraumatic.  Right Ear: External ear normal.  Left Ear: External ear normal.  Nose: Nose normal.  Mouth/Throat: Oropharynx is clear and moist.  Cardiovascular:  Normal rate and regular rhythm.   Pulmonary/Chest: Effort normal and breath sounds normal.  Abdominal: Soft. Bowel sounds are normal.  Musculoskeletal: Normal range of motion. He exhibits edema and tenderness.  Normal ROM of right metatarsals with some erythema and soft tissue swelling  Right knee moderate crepitus with flexion/extension  Neurological: He is alert and oriented to person, place, and time.  Skin: Skin is warm and dry. Rash noted.  Erythematous lesion to right tarsals, some weeping no warmth.    Psychiatric: He has a normal mood and affect. His behavior is normal.      Assessment & Plan:  Mak[place] was seen today for follow-up.  Diagnoses and associated orders for this visit:  H. pylori infection -advised to start medication.  Diabetes mellitus - Comprehensive metabolic panel - Hemoglobin A1c - Lipid Panel - to find out about glyburide. Unsure why he hasn't been able to get this filled. Will call me and let me know.  CAD (coronary artery disease) of artery bypass graft - Lipid Panel - stay off Lipitor until abx are done.  Osteoarthritis of right knee - Sedimentation Rate - C-reactive protein - DG Foot Complete Right; Future - DG Knee Complete 4 Views  Right; Future - DG Foot Complete Right - DG Knee Complete 4 Views Right  Ulceration of right foot, nonhealing - Sedimentation Rate - C-reactive protein - DG Foot Complete Right; Future - DG Foot Complete Right -to rule out for osteo. With hx of diabetes, may be due to this. He has pacemaker so unable to do MRI if need be.   Unspecified chronic bronchitis - ipratropium (ATROVENT HFA) 17 MCG/ACT inhaler; Inhale 2 puffs into the lungs every 6 (six) hours. - will add Atrovent for COPD maintenance.   To follow up via telephone of lab results and xray results.

## 2013-01-31 NOTE — Patient Instructions (Signed)
Daily Diabetes Record Week of _____________________________ Date: _________  [name] [name], BG/Medications: ________________ / __________________________________________________________  LUNCH, BG/Medications: ____________________ / __________________________________________________________  [name] [name], BG/Medications: ___________________ / __________________________________________________________  BEDTIME, BG/Medications: __________________ / __________________________________________________________ Date: _________  [name] [name], BG/Medications: ________________ / __________________________________________________________  LUNCH, BG/Medications: ____________________ / __________________________________________________________  [name] [name], BG/Medications: ___________________ / __________________________________________________________  BEDTIME, BG/Medications: __________________ / __________________________________________________________ Date: _________  [name] [name], BG/Medications: ________________ / __________________________________________________________  LUNCH, BG/Medications: ____________________ / __________________________________________________________  [name] [name], BG/Medications: ___________________ / __________________________________________________________  BEDTIME, BG/Medications: __________________ / __________________________________________________________ Date: _________  [name] [name], BG/Medications: ________________ / __________________________________________________________  LUNCH, BG/Medications: ____________________ / __________________________________________________________  [name] [name], BG/Medications: ___________________ / __________________________________________________________  BEDTIME, BG/Medications: __________________ / __________________________________________________________ Date: _________  [name] [name], BG/Medications: ________________ /  __________________________________________________________  LUNCH, BG/Medications: ____________________ / __________________________________________________________  [name] [name], BG/Medications: ___________________ / __________________________________________________________  BEDTIME, BG/Medications: __________________ / __________________________________________________________ Date: _________  [name] [name], BG/Medications: ________________ / __________________________________________________________  LUNCH, BG/Medications: ____________________ / __________________________________________________________  [name] [name], BG/Medications: ___________________ / __________________________________________________________  BEDTIME, BG/Medications: __________________ / __________________________________________________________ Date: _________  [name] [name], BG/Medications: ________________ / __________________________________________________________  LUNCH, BG/Medications: ____________________ / __________________________________________________________  [name] [name], BG/Medications: ___________________ / __________________________________________________________  BEDTIME, BG/Medications: __________________ / __________________________________________________________ Notes: __________________________________________________________________________________________________ Document Released: 12/25/2003 Document Revised: 04/14/2011 Document Reviewed: 10/18/2008 [employer] Patient Information 2014 [employer]

## 2013-02-01 LAB — SEDIMENTATION RATE: Sed Rate: 4 mm/hr (ref 0–16)

## 2013-02-02 ENCOUNTER — Encounter: Payer: Self-pay | Admitting: Family Medicine

## 2013-02-02 ENCOUNTER — Other Ambulatory Visit: Payer: Self-pay | Admitting: Family Medicine

## 2013-02-02 DIAGNOSIS — M25461 Effusion, right knee: Secondary | ICD-10-CM

## 2013-02-02 DIAGNOSIS — E11621 Type 2 diabetes mellitus with foot ulcer: Secondary | ICD-10-CM

## 2013-02-02 DIAGNOSIS — E1159 Type 2 diabetes mellitus with other circulatory complications: Secondary | ICD-10-CM

## 2013-02-02 MED ORDER — METFORMIN HCL 500 MG PO TABS: ORAL_TABLET | ORAL | Status: DC

## 2013-02-02 MED ORDER — GLYBURIDE 2.5 MG PO TABS: 2.5000 mg | ORAL_TABLET | Freq: Every day | ORAL | Status: DC

## 2013-02-02 NOTE — Progress Notes (Signed)
Spoke to Daughter of Mr Zed, who is his primary care giver. He has elevated hgb A1c and will be adding Metformin to his regimen. Have changed his glyburide to 2.5mg  1 tab po with breakfast. For the first week, he is to take metformin 500mg  1 tab po daily. After the first week, will go up to 1 tab po BID.   He will be referred to Ortho due to right knee effusion with posttraumatic changes and remote osteochondral impaction.   He had xrays done of right foot and ESR/CRP to rule out evidence of possible Osteo with his ulceration to his right metatarsals. This is chronic in nature but he also has worsening pain and numbness. He will be set up with Podiatry for this and continued foot care due to his diabetes.   All this was discussed with daughter via telephone and she has voiced understanding. I will also send a letter in the mail with the lab results and xray findings.

## 2013-03-01 DIAGNOSIS — Z961 Presence of intraocular lens: Secondary | ICD-10-CM | POA: Diagnosis not present

## 2013-03-02 NOTE — Progress Notes (Signed)
Called with appt to foot Dr., daughter said she had already made him an appt.

## 2013-03-03 ENCOUNTER — Ambulatory Visit: Payer: Medicare Other | Admitting: Family Medicine

## 2013-03-15 DIAGNOSIS — B351 Tinea unguium: Secondary | ICD-10-CM | POA: Diagnosis not present

## 2013-03-17 ENCOUNTER — Ambulatory Visit: Payer: Medicare Other | Admitting: Podiatry

## 2013-04-05 DIAGNOSIS — E1159 Type 2 diabetes mellitus with other circulatory complications: Secondary | ICD-10-CM | POA: Diagnosis not present

## 2013-04-13 ENCOUNTER — Encounter: Payer: Medicare Other | Admitting: *Deleted

## 2013-04-21 ENCOUNTER — Encounter: Payer: Self-pay | Admitting: *Deleted

## 2013-04-29 ENCOUNTER — Encounter: Payer: Self-pay | Admitting: Family Medicine

## 2013-04-29 ENCOUNTER — Ambulatory Visit (INDEPENDENT_AMBULATORY_CARE_PROVIDER_SITE_OTHER): Payer: Medicare Other | Admitting: Family Medicine

## 2013-04-29 VITALS — BP 120/80 | HR 72 | Temp 97.7°F | Resp 18 | Ht 69.0 in | Wt 192.2 lb

## 2013-04-29 DIAGNOSIS — H9319 Tinnitus, unspecified ear: Secondary | ICD-10-CM

## 2013-04-29 DIAGNOSIS — I2510 Atherosclerotic heart disease of native coronary artery without angina pectoris: Secondary | ICD-10-CM | POA: Insufficient documentation

## 2013-04-29 DIAGNOSIS — E538 Deficiency of other specified B group vitamins: Secondary | ICD-10-CM

## 2013-04-29 DIAGNOSIS — E1159 Type 2 diabetes mellitus with other circulatory complications: Secondary | ICD-10-CM

## 2013-04-29 DIAGNOSIS — H9190 Unspecified hearing loss, unspecified ear: Secondary | ICD-10-CM | POA: Diagnosis not present

## 2013-04-29 DIAGNOSIS — E785 Hyperlipidemia, unspecified: Secondary | ICD-10-CM | POA: Diagnosis not present

## 2013-04-29 DIAGNOSIS — I10 Essential (primary) hypertension: Secondary | ICD-10-CM | POA: Diagnosis not present

## 2013-04-29 DIAGNOSIS — IMO0002 Reserved for concepts with insufficient information to code with codable children: Secondary | ICD-10-CM | POA: Diagnosis not present

## 2013-04-29 DIAGNOSIS — H9193 Unspecified hearing loss, bilateral: Secondary | ICD-10-CM

## 2013-04-29 DIAGNOSIS — M1710 Unilateral primary osteoarthritis, unspecified knee: Secondary | ICD-10-CM

## 2013-04-29 DIAGNOSIS — M1711 Unilateral primary osteoarthritis, right knee: Secondary | ICD-10-CM

## 2013-04-29 DIAGNOSIS — N400 Enlarged prostate without lower urinary tract symptoms: Secondary | ICD-10-CM

## 2013-04-29 DIAGNOSIS — E119 Type 2 diabetes mellitus without complications: Secondary | ICD-10-CM

## 2013-04-29 DIAGNOSIS — E1149 Type 2 diabetes mellitus with other diabetic neurological complication: Secondary | ICD-10-CM | POA: Insufficient documentation

## 2013-04-29 DIAGNOSIS — H9313 Tinnitus, bilateral: Secondary | ICD-10-CM | POA: Insufficient documentation

## 2013-04-29 HISTORY — DX: Unspecified hearing loss, bilateral: H91.93

## 2013-04-29 LAB — LIPID PANEL
CHOL/HDL RATIO: 3.5 ratio
Cholesterol: 143 mg/dL (ref 0–200)
HDL: 41 mg/dL (ref >39)
LDL CALC: 74 mg/dL (ref 0–99)
TRIGLYCERIDES: 141 mg/dL (ref <150)
VLDL: 28 mg/dL (ref 0–40)

## 2013-04-29 LAB — COMPREHENSIVE METABOLIC PANEL
ALT: 90 U/L — ABNORMAL HIGH (ref 0–53)
AST: 70 U/L — ABNORMAL HIGH (ref 0–37)
Albumin: 3.5 g/dL (ref 3.5–5.2)
Alkaline Phosphatase: 131 U/L — ABNORMAL HIGH (ref 39–117)
BUN: 16 mg/dL (ref 6–23)
CO2: 25 mEq/L (ref 19–32)
CREATININE: 0.73 mg/dL (ref 0.50–1.35)
Calcium: 8.8 mg/dL (ref 8.4–10.5)
Chloride: 107 mEq/L (ref 96–112)
GLUCOSE: 218 mg/dL — AB (ref 70–99)
Potassium: 5.1 mEq/L (ref 3.5–5.3)
Sodium: 138 mEq/L (ref 135–145)
Total Bilirubin: 0.9 mg/dL (ref 0.2–1.2)
Total Protein: 6.6 g/dL (ref 6.0–8.3)

## 2013-04-29 LAB — HEMOGLOBIN A1C
Hgb A1c MFr Bld: 7 % — ABNORMAL HIGH (ref <5.7)
Mean Plasma Glucose: 154 mg/dL — ABNORMAL HIGH (ref <117)

## 2013-04-29 MED ORDER — ISOSORBIDE MONONITRATE ER 30 MG PO TB24: ORAL_TABLET | ORAL | Status: DC

## 2013-04-29 MED ORDER — METFORMIN HCL 500 MG PO TABS: 500.0000 mg | ORAL_TABLET | Freq: Two times a day (BID) | ORAL | Status: DC

## 2013-04-29 MED ORDER — OMEPRAZOLE 20 MG PO CPDR: 20.0000 mg | DELAYED_RELEASE_CAPSULE | Freq: Every day | ORAL | Status: DC

## 2013-04-29 MED ORDER — FOLIC ACID 1 MG PO TABS: 1.0000 mg | ORAL_TABLET | Freq: Every day | ORAL | Status: DC

## 2013-04-29 MED ORDER — CLOBETASOL PROPIONATE 0.05 % EX CREA: 1.0000 "application " | TOPICAL_CREAM | Freq: Every day | CUTANEOUS | Status: DC

## 2013-04-29 MED ORDER — GLYBURIDE 2.5 MG PO TABS: 2.5000 mg | ORAL_TABLET | Freq: Every day | ORAL | Status: DC

## 2013-04-29 MED ORDER — TERAZOSIN HCL 5 MG PO CAPS: 5.0000 mg | ORAL_CAPSULE | Freq: Every evening | ORAL | Status: DC

## 2013-04-29 MED ORDER — SERTACONAZOLE NITRATE 2 % EX CREA: TOPICAL_CREAM | CUTANEOUS | Status: DC

## 2013-04-29 MED ORDER — METOPROLOL TARTRATE 50 MG PO TABS: 50.0000 mg | ORAL_TABLET | Freq: Two times a day (BID) | ORAL | Status: DC

## 2013-04-29 MED ORDER — LISINOPRIL 2.5 MG PO TABS: 2.5000 mg | ORAL_TABLET | Freq: Every day | ORAL | Status: DC

## 2013-04-29 MED ORDER — DICLOFENAC SODIUM 1 % TD GEL: TRANSDERMAL | Status: DC

## 2013-04-29 NOTE — Patient Instructions (Signed)
Diclofenac skin gel What is this medicine? DICLOFENAC (dye KLOE fen ak) is a non-steroidal anti-inflammatory drug (NSAID). The 1% skin gel is used to treat osteoarthritis of the hands or knees. The 3% skin gel is used to treat actinic keratosis. This medicine may be used for other purposes; ask your health care provider or pharmacist if you have questions. COMMON BRAND NAME(S): Solaraze, Voltaren Gel What should I tell my health care provider before I take this medicine? They need to know if you have any of these conditions: -asthma -bleeding problems -coronary artery bypass graft (CABG) surgery within the past 2 weeks -heart disease -high blood pressure -if you frequently drink alcohol containing drinks -kidney disease -liver disease -open or infected skin -stomach problems -an unusual or allergic reaction to diclofenac, aspirin, other NSAIDs, other medicines, benzyl alcohol (3% gel only), foods, dyes, or preservatives -pregnant or trying to get pregnant -breast-feeding How should I use this medicine? This medicine is for external use only. Follow the directions on the prescription label. Wash hands before and after use. Do not get this medicine in your eyes. If you do, rinse out with plenty of cool tap water. Use your doses at regular intervals. Do not use your medicine more often than directed. A special MedGuide will be given to you by the pharmacist with each prescription and refill of the 1% gel. Be sure to read this information carefully each time. Talk to your pediatrician regarding the use of this medicine in children. Special care may be needed. The 3% gel is not approved for use in children. Overdosage: If you think you have taken too much of this medicine contact a poison control center or emergency room at once. NOTE: This medicine is only for you. Do not share this medicine with others. What if I miss a dose? If you miss a dose, use it as soon as you can. If it is almost time  for your next dose, use only that dose. Do not use double or extra doses. What may interact with this medicine? -aspirin -NSAIDs, medicines for pain and inflammation, like ibuprofen or naproxen Do not use any other skin products without telling your doctor or health care professional. This list may not describe all possible interactions. Give your health care provider a list of all the medicines, herbs, non-prescription drugs, or dietary supplements you use. Also tell them if you smoke, drink alcohol, or use illegal drugs. Some items may interact with your medicine. What should I watch for while using this medicine? Tell your doctor or healthcare professional if your symptoms do not start to get better or if they get worse. You will need to follow up with your health care provider to monitor your progress. You may need to be treated for up to 3 months if you are using the 3% gel, but the full effect may not occur until 1 month after stopping treatment. If you develop a severe skin reaction, contact your doctor or health care professional immediately. This medicine can make you more sensitive to the sun. Keep out of the sun. If you cannot avoid being in the sun, wear protective clothing and use sunscreen. Do not use sun lamps or tanning beds/booths. Do not take medicines such as ibuprofen and naproxen with this medicine. Side effects such as stomach upset, nausea, or ulcers may be more likely to occur. Many medicines available without a prescription should not be taken with this medicine. This medicine does not prevent heart attack or stroke.  In fact, this medicine may increase the chance of a heart attack or stroke. The chance may increase with longer use of this medicine and in people who have heart disease. If you take aspirin to prevent heart attack or stroke, talk with your doctor or health care professional. This medicine can cause ulcers and bleeding in the stomach and intestines at any time during  treatment. Do not smoke cigarettes or drink alcohol. These increase irritation to your stomach and can make it more susceptible to damage from this medicine. Ulcers and bleeding can happen without warning symptoms and can cause death. You may get drowsy or dizzy. Do not drive, use machinery, or do anything that needs mental alertness until you know how this medicine affects you. Do not stand or sit up quickly, especially if you are an older patient. This reduces the risk of dizzy or fainting spells. This medicine can cause you to bleed more easily. Try to avoid damage to your teeth and gums when you brush or floss your teeth. What side effects may I notice from receiving this medicine? Side effects that you should report to your doctor or health care professional as soon as possible: -allergic reactions like skin rash, itching or hives, swelling of the face, lips, or tongue -black or bloody stools, blood in the urine or vomit -blurred vision -chest pain -difficulty breathing or wheezing -nausea or vomiting -redness, blistering, peeling or loosening of the skin, including inside the mouth -slurred speech or weakness on one side of the body -trouble passing urine or change in the amount of urine -unexplained weight gain or swelling -unusually weak or tired -yellowing of eyes or skin Side effects that usually do not require medical attention (report to your doctor or health care professional if they continue or are bothersome): -dizziness -dry skin -headache -heartburn -increased sensitivity to the sun -stomach pain -tingling at the application site This list may not describe all possible side effects. Call your doctor for medical advice about side effects. You may report side effects to FDA at 1-800-FDA-1088. Where should I keep my medicine? Keep out of the reach of children. Store the 1% gel at room temperature between 15 and 30 degrees C (59 and 86 degrees F). Store the 3% gel at room  temperature between 20 and 25 degrees C (68 and 77 degrees F). Protect from light. Throw away any unused medicine after the expiration date. NOTE: This sheet is a summary. It may not cover all possible information. If you have questions about this medicine, talk to your doctor, pharmacist, or health care provider.  2014, Elsevier/Gold Standard. (2007-05-24 16:35:07) Osteoarthritis Osteoarthritis is a disease that causes soreness and swelling (inflammation) of a joint. It occurs when the cartilage at the affected joint wears down. Cartilage acts as a cushion, covering the ends of bones where they meet to form a joint. Osteoarthritis is the most common form of arthritis. It often occurs in older people. The joints affected most often by this condition include those in the:  Ends of the fingers.  Thumbs.  Neck.  Lower back.  Knees.  Hips. CAUSES  Over time, the cartilage that covers the ends of bones begins to wear away. This causes bone to rub on bone, producing pain and stiffness in the affected joints.  RISK FACTORS Certain factors can increase your chances of having osteoarthritis, including:  Older age.  Excessive body weight.  Overuse of joints. SIGNS AND SYMPTOMS   Pain, swelling, and stiffness in the  joint.  Over time, the joint may lose its normal shape.  Small deposits of bone (osteophytes) may grow on the edges of the joint.  Bits of bone or cartilage can break off and float inside the joint space. This may cause more pain and damage. DIAGNOSIS  Your health care provider will do a physical exam and ask about your symptoms. Various tests may be ordered, such as:  X-rays of the affected joint.  An MRI scan.  Blood tests to rule out other types of arthritis.  Joint fluid tests. This involves using a needle to draw fluid from the joint and examining the fluid under a microscope. TREATMENT  Goals of treatment are to control pain and improve joint function. Treatment  plans may include:  A prescribed exercise program that allows for rest and joint relief.  A weight control plan.  Pain relief techniques, such as:  Properly applied heat and cold.  Electric pulses delivered to nerve endings under the skin (transcutaneous electrical nerve stimulation, TENS).  Massage.  Certain nutritional supplements.  Medicines to control pain, such as:  Acetaminophen.  Nonsteroidal anti-inflammatory drugs (NSAIDs), such as naproxen.  Narcotic or central-acting agents, such as tramadol.  Corticosteroids. These can be given orally or as an injection.  Surgery to reposition the bones and relieve pain (osteotomy) or to remove loose pieces of bone and cartilage. Joint replacement may be needed in advanced states of osteoarthritis. HOME CARE INSTRUCTIONS   Only take over-the-counter or prescription medicines as directed by your health care provider. Take all medicines exactly as instructed.  Maintain a healthy weight. Follow your health care provider's instructions for weight control. This may include dietary instructions.  Exercise as directed. Your health care provider can recommend specific types of exercise. These may include:  Strengthening exercises These are done to strengthen the muscles that support joints affected by arthritis. They can be performed with weights or with exercise bands to add resistance.  Aerobic activities These are exercises, such as brisk walking or low-impact aerobics, that get your heart pumping.  Range-of-motion activities These keep your joints limber.  Balance and agility exercises These help you maintain daily living skills.  Rest your affected joints as directed by your health care provider.  Follow up with your health care provider as directed. SEEK MEDICAL CARE IF:   Your skin turns red.  You develop a rash in addition to your joint pain.  You have worsening joint pain. SEEK IMMEDIATE MEDICAL CARE IF:  You have a  significant loss of weight or appetite.  You have a fever along with joint or muscle aches.  You have night sweats. [place] of Arthritis and Musculoskeletal and Skin Diseases: www.niams.SouthExposed.es [employer] on Aging: http://kim-miller.com/ American College of Rheumatology: www.rheumatology.org Document Released: 01/20/2005 Document Revised: 11/10/2012 Document Reviewed: 09/27/2012 [facility] Patient Information 2014 [place], [place]. Hypertension As your heart beats, it forces blood through your arteries. This force is your blood pressure. If the pressure is too high, it is called hypertension (HTN) or high blood pressure. HTN is dangerous because you may have it and not know it. High blood pressure may mean that your heart has to work harder to pump blood. Your arteries may be narrow or stiff. The extra work puts you at risk for heart disease, stroke, and other problems.  Blood pressure consists of two numbers, a higher number over a lower, 110/72, for example. It is stated as "110 over 72." The ideal is below 120  for the top number (systolic) and under 80 for the bottom (diastolic). Write down your blood pressure today. You should pay close attention to your blood pressure if you have certain conditions such as:  Heart failure.  Prior heart attack.  Diabetes  Chronic kidney disease.  Prior stroke.  Multiple risk factors for heart disease. To see if you have HTN, your blood pressure should be measured while you are seated with your arm held at the level of the heart. It should be measured at least twice. A one-time elevated blood pressure reading (especially in the Emergency Department) does not mean that you need treatment. There may be conditions in which the blood pressure is different between your right and left arms. It is important to see your caregiver soon for a recheck. Most people have essential hypertension which means that there is not a specific  cause. This type of high blood pressure may be lowered by changing lifestyle factors such as:  Stress.  Smoking.  Lack of exercise.  Excessive weight.  Drug/tobacco/alcohol use.  Eating less salt. Most people do not have symptoms from high blood pressure until it has caused damage to the body. Effective treatment can often prevent, delay or reduce that damage. TREATMENT  When a cause has been identified, treatment for high blood pressure is directed at the cause. There are a large number of medications to treat HTN. These fall into several categories, and your caregiver will help you select the medicines that are best for you. Medications may have side effects. You should review side effects with your caregiver. If your blood pressure stays high after you have made lifestyle changes or started on medicines,   Your medication(s) may need to be changed.  Other problems may need to be addressed.  Be certain you understand your prescriptions, and know how and when to take your medicine.  Be sure to follow up with your caregiver within the time frame advised (usually within two weeks) to have your blood pressure rechecked and to review your medications.  If you are taking more than one medicine to lower your blood pressure, make sure you know how and at what times they should be taken. Taking two medicines at the same time can result in blood pressure that is too low. SEEK IMMEDIATE MEDICAL CARE IF:  You develop a severe headache, blurred or changing vision, or confusion.  You have unusual weakness or numbness, or a faint feeling.  You have severe chest or abdominal pain, vomiting, or breathing problems. MAKE SURE YOU:   Understand these instructions.  Will watch your condition.  Will get help right away if you are not doing well or get worse. Document Released: 01/20/2005 Document Revised: 04/14/2011 Document Reviewed: 09/10/2007 [facility] Patient Information 2014 [place].

## 2013-04-29 NOTE — Progress Notes (Signed)
Subjective:     Patient ID: [name] [name], male   DOB: [birth year], 73 y.o.   MRN: [id number]  Hypertension This is a chronic problem. The current episode started more than 1 year ago. The problem is unchanged. The problem is controlled. Pertinent negatives include no anxiety, blurred vision, chest pain, headaches, malaise/fatigue, peripheral edema, shortness of breath or sweats. There are no associated agents to hypertension. Risk factors for coronary artery disease include diabetes mellitus, dyslipidemia, male gender and sedentary lifestyle. Past treatments include ACE inhibitors and alpha 1 blockers. The current treatment provides significant improvement. There are no compliance problems.  Hypertensive end-organ damage includes angina and CAD/MI. There is no history of chronic renal disease.  Hyperlipidemia This is a chronic problem. The current episode started more than 1 year ago. The problem is controlled. Recent lipid tests were reviewed and are normal. Exacerbating diseases include diabetes and liver disease. He has no history of chronic renal disease. has Hep C. There are no known factors aggravating his hyperlipidemia. Pertinent negatives include no chest pain, focal sensory loss or shortness of breath. Current antihyperlipidemic treatment includes statins. The current treatment provides significant improvement of lipids. There are no compliance problems.  Risk factors for coronary artery disease include diabetes mellitus, dyslipidemia, hypertension, male sex and a sedentary lifestyle.  Diabetes He presents for his follow-up diabetic visit. He has type 2 diabetes mellitus. His disease course has been stable. Pertinent negatives for hypoglycemia include no confusion, dizziness, headaches, mood changes, nervousness/anxiousness, pallor, seizures, sleepiness, sweats or tremors. Pertinent negatives for diabetes include no blurred vision, no chest pain, no fatigue, no foot paresthesias, no polyuria, no  visual change and no weakness. There are no hypoglycemic complications. Symptoms are stable. Diabetic complications include heart disease. Risk factors for coronary artery disease include diabetes mellitus, dyslipidemia, hypertension, male sex and sedentary lifestyle. He is compliant with treatment all of the time. His weight is stable. He is following a generally healthy diet. He rarely participates in exercise. His breakfast blood glucose range is generally 110-130 mg/dl. His dinner blood glucose range is generally 180-200 mg/dl. An ACE inhibitor/angiotensin II receptor blocker is being taken. He sees a podiatrist.Eye exam is current.  Benign Prostatic Hypertrophy This is a chronic problem. The current episode started more than 1 year ago. The problem is unchanged. Irritative symptoms do not include frequency, nocturia or urgency. Obstructive symptoms do not include dribbling or an intermittent stream. Pertinent negatives include no nausea or vomiting. He is not sexually active. Nothing aggravates the symptoms. Past treatments include terazosin. The treatment provided significant relief. He has been using treatment for 1 to 2 years.  Knee Pain  The incident occurred more than 1 week ago (chronic). Incident location: unknown. The injury mechanism is unknown. The pain is present in the right knee. The quality of the pain is described as aching. The pain is at a severity of 6/10. The pain is moderate. The pain has been fluctuating since onset. Associated symptoms include a loss of motion. Pertinent negatives include no inability to bear weight, loss of sensation, muscle weakness, numbness or tingling. Associated symptoms comments: Secondary to pain . He reports no foreign bodies present. The symptoms are aggravated by weight bearing and movement. He has tried rest for the symptoms. The treatment provided mild relief.  He did get Xrays of his right knee and his foot. His foot was noted to have foreign bodies  present. He is unsure where this came from as he's not stepped on any  objects. He was also noted to have moderate joint effusion in his right knee. A referral was sent to Ortho for this but he still hasn't gotten called regarding an appt for this.   Ringing in ears: He complains of ringing in ears and some hearing loss that's been present for the last several months. He says it was going on ever since he was in prison. He does report that he got to the point where he got use to it but now, in the last several months, its more noticeable. He says he can hear ringing in his ears as he sits in the office today. He also says he has difficulty making out noises from a far.   Past Medical History  Diagnosis Date  . Diabetes mellitus without complication   . Cataract   . Myocardial infarction 2009    one stent  . Hypertension   . Hyperlipidemia   . 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 aspirin EC 81 MG tablet Take 81 mg by mouth daily.      Marland Kitchen atorvastatin (LIPITOR) 20 MG tablet Take 1 tablet (20 mg total) by mouth daily.  90 tablet  3  . benzonatate (TESSALON) 100 MG capsule Take 1 capsule (100 mg total) by mouth every 8 (eight) hours.  21 capsule  0  . ipratropium (ATROVENT HFA) 17 MCG/ACT inhaler Inhale 2 puffs into the lungs every 6 (six) hours.  1 Inhaler  1  . Polyethyl Glycol-Propyl Glycol (SYSTANE ULTRA OP) Place 1 drop into both eyes daily as needed (Dry Eyes).      . predniSONE (DELTASONE) 10 MG tablet Take 2 tablets (20 mg total) by mouth daily.  10 tablet  0   No current facility-administered medications on file prior to visit.   No Known Allergies    Review of Systems  Constitutional: Negative for malaise/fatigue, activity change, appetite change, fatigue and unexpected weight change.  HENT: Positive for  tinnitus. Negative for congestion, drooling, sinus pressure, sneezing, sore throat, trouble swallowing and voice change.   Eyes: Negative for blurred vision, redness and visual disturbance.  Respiratory: Negative for shortness of breath.   Cardiovascular: Negative for chest pain.  Gastrointestinal: Negative for nausea, vomiting, abdominal pain, diarrhea and constipation.  Endocrine: Negative for cold intolerance and polyuria.  Genitourinary: Negative for urgency, frequency, decreased urine volume, scrotal swelling and nocturia.  Musculoskeletal: Positive for gait problem and joint swelling. Negative for back pain.  Skin: Negative for pallor.  Allergic/Immunologic: Positive for immunocompromised state. Negative for environmental allergies.  Neurological: Negative for dizziness, tingling, tremors, seizures, weakness, numbness and headaches.  Psychiatric/Behavioral: Negative for confusion. The patient is not nervous/anxious.        Objective:   Physical Exam  Nursing note and vitals reviewed. Constitutional: He is oriented to person, place, and time. He appears well-developed and well-nourished.  HENT:  Head: Normocephalic and atraumatic.  Right Ear: External ear normal.  Left Ear: External ear normal.  Nose: Nose normal.  Mouth/Throat: Oropharynx is clear and moist.  Eyes: Conjunctivae are normal. Pupils are equal, round, and reactive to light.  Neck: Normal range of motion.  Cardiovascular: Normal rate, regular rhythm and normal heart sounds.   Pulmonary/Chest: Effort normal and breath sounds normal.  Abdominal: Soft. Bowel sounds  are normal.  Musculoskeletal: He exhibits tenderness.  Decrease ROM with flexion and extension to right knee with moderate amount of crepitus with flexion and extension.   Neurological: He is alert and oriented to person, place, and time.  Skin: Skin is warm and dry.  Psychiatric: He has a normal mood and affect. His behavior is normal. Thought content  normal.       Assessment:     Vera was seen today for follow-up.  Diagnoses and associated orders for this visit:  Diabetes mellitus - Hemoglobin A1c - lisinopril (PRINIVIL,ZESTRIL) 2.5 MG tablet; Take 1 tablet (2.5 mg total) by mouth daily.  Osteoarthritis of right knee - diclofenac sodium (VOLTAREN) 1 % GEL; Apply pea size amount to affected joints.  Tinnitus of both ears  HTN (hypertension) - Comprehensive metabolic panel - Lipid Panel - metoprolol (LOPRESSOR) 50 MG tablet; Take 1 tablet (50 mg total) by mouth 2 (two) times daily. - lisinopril (PRINIVIL,ZESTRIL) 2.5 MG tablet; Take 1 tablet (2.5 mg total) by mouth daily. - isosorbide mononitrate (IMDUR) 30 MG 24 hr tablet; Take 2 tablets in the morning and 1 tablet at night  HLD (hyperlipidemia) - Comprehensive metabolic panel - Lipid Panel  BPH (benign prostatic hyperplasia) - terazosin (HYTRIN) 5 MG capsule; Take 1 capsule (5 mg total) by mouth every evening.  Type II or unspecified type diabetes mellitus with peripheral circulatory disorders, uncontrolled(250.72) - metFORMIN (GLUCOPHAGE) 500 MG tablet; Take 1 tablet (500 mg total) by mouth 2 (two) times daily with a meal. - glyBURIDE (DIABETA) 2.5 MG tablet; Take 1 tablet (2.5 mg total) by mouth daily with breakfast.  Vitamin B35 deficiency - folic acid (FOLVITE) 1 MG tablet; Take 1 tablet (1 mg total) by mouth daily.  CAD (coronary artery disease) - metoprolol (LOPRESSOR) 50 MG tablet; Take 1 tablet (50 mg total) by mouth 2 (two) times daily. - lisinopril (PRINIVIL,ZESTRIL) 2.5 MG tablet; Take 1 tablet (2.5 mg total) by mouth daily. - isosorbide mononitrate (IMDUR) 30 MG 24 hr tablet; Take 2 tablets in the morning and 1 tablet at night  Other Orders - clobetasol cream (TEMOVATE) 0.05 %; Apply 1 application topically daily. - Sertaconazole Nitrate 2 % CREA; Apply daily - omeprazole (PRILOSEC) 20 MG capsule; Take 1 capsule (20 mg total) by mouth daily.         Plan:     For OA of knee and other joints mentioned today, will do Diclofenac gel to be used prn. Unable to do NSAIDs' due to hepatotoxicity and hx of Hep C.  Refilled chronic medications.  Getting labs today.  Follow up on referral to Ortho for joint effusion of right knee and send to Audiology for tinnitus of ears and hearing screening. May be signs of hearing loss. He's on baby ASA and doubt aspirin toxicity is the cause of his tinnitus but may warrant getting levels if audiology screening doesn't reveal cause.

## 2013-05-02 ENCOUNTER — Encounter: Payer: Self-pay | Admitting: Family Medicine

## 2013-05-02 NOTE — Addendum Note (Signed)
Addended by: Sandi Mealy on: 05/02/2013 08:59 AM   Modules accepted: Orders

## 2013-05-03 ENCOUNTER — Telehealth: Payer: Self-pay | Admitting: Internal Medicine

## 2013-05-03 DIAGNOSIS — L259 Unspecified contact dermatitis, unspecified cause: Secondary | ICD-10-CM | POA: Diagnosis not present

## 2013-05-03 NOTE — Telephone Encounter (Signed)
New message    Pt tried to send a remote transmission but do not think it worked.  Please call and walk them thru it

## 2013-05-03 NOTE — Telephone Encounter (Signed)
Pt's daughter to resend transmission. Daughter thinks transmitter was turned off too soon. Will resend.

## 2013-05-05 ENCOUNTER — Ambulatory Visit (INDEPENDENT_AMBULATORY_CARE_PROVIDER_SITE_OTHER): Payer: Medicare Other | Admitting: *Deleted

## 2013-05-05 ENCOUNTER — Encounter: Payer: Self-pay | Admitting: Internal Medicine

## 2013-05-05 DIAGNOSIS — Z950 Presence of cardiac pacemaker: Secondary | ICD-10-CM | POA: Diagnosis not present

## 2013-05-05 DIAGNOSIS — I498 Other specified cardiac arrhythmias: Secondary | ICD-10-CM | POA: Diagnosis not present

## 2013-05-05 LAB — MDC_IDC_ENUM_SESS_TYPE_REMOTE
Brady Statistic AP VP Percent: 0 %
Brady Statistic AP VS Percent: 87 %
Brady Statistic AS VP Percent: 0 %
Brady Statistic AS VS Percent: 13 %
Date Time Interrogation Session: 20150402092459
Lead Channel Pacing Threshold Amplitude: 0.5 V
Lead Channel Pacing Threshold Amplitude: 1.125 V
Lead Channel Pacing Threshold Pulse Width: 0.4 ms
Lead Channel Sensing Intrinsic Amplitude: 11.2 mV
Lead Channel Setting Pacing Amplitude: 1.5 V
MDC IDC MSMT BATTERY IMPEDANCE: 370 Ohm
MDC IDC MSMT BATTERY REMAINING LONGEVITY: 103 mo
MDC IDC MSMT BATTERY VOLTAGE: 2.8 V
MDC IDC MSMT LEADCHNL RA IMPEDANCE VALUE: 463 Ohm
MDC IDC MSMT LEADCHNL RV IMPEDANCE VALUE: 567 Ohm
MDC IDC MSMT LEADCHNL RV PACING THRESHOLD PULSEWIDTH: 0.4 ms
MDC IDC SET LEADCHNL RV PACING AMPLITUDE: 2.75 V
MDC IDC SET LEADCHNL RV PACING PULSEWIDTH: 0.4 ms
MDC IDC SET LEADCHNL RV SENSING SENSITIVITY: 2 mV

## 2013-05-10 ENCOUNTER — Encounter: Payer: Self-pay | Admitting: Internal Medicine

## 2013-05-11 ENCOUNTER — Encounter: Payer: Self-pay | Admitting: Gastroenterology

## 2013-05-20 ENCOUNTER — Other Ambulatory Visit: Payer: Self-pay | Admitting: Gastroenterology

## 2013-05-31 ENCOUNTER — Encounter: Payer: Self-pay | Admitting: *Deleted

## 2013-06-09 ENCOUNTER — Ambulatory Visit (INDEPENDENT_AMBULATORY_CARE_PROVIDER_SITE_OTHER): Payer: Medicare Other | Admitting: Cardiovascular Disease

## 2013-06-09 ENCOUNTER — Encounter: Payer: Self-pay | Admitting: Cardiovascular Disease

## 2013-06-09 VITALS — BP 118/58 | HR 67 | Ht 70.0 in | Wt 192.0 lb

## 2013-06-09 DIAGNOSIS — Z950 Presence of cardiac pacemaker: Secondary | ICD-10-CM

## 2013-06-09 DIAGNOSIS — I10 Essential (primary) hypertension: Secondary | ICD-10-CM | POA: Diagnosis not present

## 2013-06-09 DIAGNOSIS — Z9861 Coronary angioplasty status: Secondary | ICD-10-CM

## 2013-06-09 DIAGNOSIS — Z955 Presence of coronary angioplasty implant and graft: Secondary | ICD-10-CM

## 2013-06-09 DIAGNOSIS — E785 Hyperlipidemia, unspecified: Secondary | ICD-10-CM

## 2013-06-09 DIAGNOSIS — I2510 Atherosclerotic heart disease of native coronary artery without angina pectoris: Secondary | ICD-10-CM | POA: Diagnosis not present
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
## 2013-06-09 NOTE — Patient Instructions (Signed)
Your physician wants you to follow-up in: 6 months You will receive a reminder letter in the mail two months in advance. If you don't receive a letter, please call our office to schedule the follow-up appointment.     Your physician recommends that you continue on your current medications as directed. Please refer to the Current Medication list given to you today.      Thank you for choosing [facility] !        

## 2013-06-10 ENCOUNTER — Encounter: Payer: Self-pay | Admitting: *Deleted

## 2013-06-16 ENCOUNTER — Telehealth: Payer: Self-pay | Admitting: Gastroenterology

## 2013-06-16 ENCOUNTER — Encounter: Payer: Self-pay | Admitting: Gastroenterology

## 2013-06-16 ENCOUNTER — Ambulatory Visit (INDEPENDENT_AMBULATORY_CARE_PROVIDER_SITE_OTHER): Payer: Medicare Other | Admitting: Gastroenterology

## 2013-06-16 VITALS — BP 118/66 | HR 72 | Temp 98.4°F | Ht 70.0 in | Wt 192.8 lb

## 2013-06-16 DIAGNOSIS — B182 Chronic viral hepatitis C: Secondary | ICD-10-CM

## 2013-06-16 DIAGNOSIS — I2510 Atherosclerotic heart disease of native coronary artery without angina pectoris: Secondary | ICD-10-CM

## 2013-06-16 DIAGNOSIS — K7460 Unspecified cirrhosis of liver: Secondary | ICD-10-CM | POA: Diagnosis not present

## 2013-06-16 MED ORDER — LUBIPROSTONE 8 MCG PO CAPS: ORAL_CAPSULE | ORAL | Status: DC

## 2013-06-16 NOTE — Telephone Encounter (Signed)
Pt was seen today and the daughter called to see if SF had given him a rx or called something in for him to help him go to the bathroom. I don't see where anything like that was mentioned. Patient uses [facility]. Please advise. [phone]

## 2013-06-16 NOTE — Assessment & Plan Note (Signed)
TREATMENT NAIVE. COMPENSATED DISEASE.  LABS TODAY FIBROSPECT U/S JUL 2015 OPV IN 6 WEEKS TO DISCUSS BENEFITS V. RISKS OF HCV THERAPY

## 2013-06-16 NOTE — Progress Notes (Signed)
Reminder in epic to have U/S in July and patient is aware of OV on 6/25 at 9 with SF

## 2013-06-16 NOTE — Telephone Encounter (Signed)
I called and spoke to pt's daughter, Joycelyn Schmid and she said that Dr. Nona Dell was going to give him a prescription for constipation , because when he has a BM he never feels like he gets rid of it all.   Please advise!

## 2013-06-16 NOTE — Patient Instructions (Signed)
COMPLETE LABS.  FOLLOW UP IN 6 WEEKS TO DISCUSS THE BENEFITS V. RISKS OF HEPATITIS C THERAPY.Marland Kitchen

## 2013-06-16 NOTE — Telephone Encounter (Signed)
PLEASE CALL PT. RX SENT. 
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
## 2013-06-17 NOTE — Telephone Encounter (Signed)
REVIEWED.  

## 2013-06-17 NOTE — Telephone Encounter (Signed)
Tried to call with no answer  

## 2013-06-20 ENCOUNTER — Ambulatory Visit: Payer: Medicare Other | Attending: Family Medicine | Admitting: Audiology

## 2013-06-20 DIAGNOSIS — H9193 Unspecified hearing loss, bilateral: Secondary | ICD-10-CM

## 2013-06-20 DIAGNOSIS — H9190 Unspecified hearing loss, unspecified ear: Secondary | ICD-10-CM | POA: Insufficient documentation

## 2013-06-20 NOTE — Telephone Encounter (Signed)
Called and LMOM that Rx has been sent in, please call if questions.

## 2013-06-20 NOTE — Patient Instructions (Signed)
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  Speech recognition studies suggest mild impairment in quiet however, an elevated presentation level is required.  When background noise is present his speech recognition falls to 20% at best.  Middle ear function is normal.  RECOMMENDATIONS:    1. Hearing should be monitored on an annual basis and sooner should there be an increase in symptoms.  2. A hearing aid evaluation is strongly recommended at this time.   3. In addition, there are also ways to improve one's listening environment. Such as:  (A) When in a restaurant, sit with your back against a perimeter wall, thus reducing the extraneous noise.   (B) When in meetings, do not sit near an open doorway and position yourself within 8-10ft of the speaker. Front and center is typically best.  (C) Eliminate background noises at home such as the television, dishwasher, etc when having important conversations.  (D) Ask family members to face you when they speak and not to drop their heads or turn and walkway while still speaking.   4. Because Mr. Almas has a weakened auditory system he is more at risk from damage of noise exposure than someone with a healthy auditory system and therefore should avoid excessive nose exposure if possible or certainly utilize ear protection.     Ivonne Andrew Pugh, Au.D. Doctor of Audiology CCC-A

## 2013-06-20 NOTE — Progress Notes (Signed)
cc'd to pcp 

## 2013-06-20 NOTE — Procedures (Signed)
[place], [place]  [address] [place] EVALUATION  Patient Name: [name] [name]  Medical Record Number:  [id number] Date of Birth:  01-26-1941     Date of Test:  06/20/2013  HISTORY:  [name] [name], a  73 y.o. old male was seen for audiological evaluation upon referral of PICKARD,WARREN TOM, MD.   The patient reported a history of hearing loss accompanied with tinnitus for the past 18 years which has gradually worsened.  Mr. Leeman stated that he was exposed to occupational noise exposure however utilized ear protection for the majority of the time.  There was no report of familial hearing loss, dizziness, headaches or facial numbness.  Although the has constant tinnitus, it does not "bother" him or keep him from falling asleep at night.  With respect to his hearing, he states that he can hear speech but has significant difficulty understanding it unless in a quiet situation and the speaker is facing him and using an elevated presentation level. Other significant health issues include Diabetes and Hepatitis C.   REPORT OF PAIN:  None  EVALUATION:   Air and bone conduction audiometry from 500Hz  - 8000Hz  utilizing standard earphones revealed a mild to severe sensory neural hearing loss beginning at 2000Hz .   Speech reception thresholds were consistent with the pure tone results indicative of good test reliability.  Speech recognition testing was conducted in each ear independently, at a comfortable listening level (70dBHL) and indicated 88% and 100% in the right and left ears respectively.  Speech recognition abilities while in the presence of a soft background noise (s/n+5) were also evaluated. Under this condition, scores were 0% in the right and 20% in the left ear.   Impedance audiometry was utilized and a Type A was obtained on the right side and a Type A was obtained on the left side suggesting good  middle ear fucntion.  Acoustic reflexes were tested from 500Hz  - 2000Hz  and were present on the right side and present on the left side.  Distortion Product Otoacoustic Emissions were tested from 2000Hz  - 10000Hz  and were absent on the right side and absent on the left side, indicative of poor outer hair cell function within in the inner ear.  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  Speech recognition studies suggest mild impairment in quiet however, an elevated presentation level is required.  When background noise is present his speech recognition falls to 20% at best.  Middle ear function is normal.  RECOMMENDATIONS:    1. Hearing should be monitored on an annual basis and sooner should there be an increase in symptoms.  2. A hearing aid evaluation is strongly recommended at this time.   3. In addition, there are also ways to improve one's listening environment. Such as:  (A) When in a restaurant, sit with your back against a perimeter wall, thus reducing the extraneous noise.   (B) When in meetings, do not sit near an open doorway and position yourself within 8-10ft of the speaker. Front and center is typically best.  (C) Eliminate background noises at home such as the television, dishwasher, etc when having important conversations.  (D) Ask family members to face you when they speak and not to drop their heads or turn and walkway while still speaking.   4. Because Mr. Heitzenrater has a weakened auditory system he is more at risk from damage of noise  exposure than someone with a healthy auditory system and therefore should avoid excessive nose exposure if possible or certainly utilize ear protection.     Ivonne Andrew Pugh, Au.D. Doctor of Audiology CCC-A

## 2013-07-28 ENCOUNTER — Encounter: Payer: Self-pay | Admitting: Gastroenterology

## 2013-07-28 ENCOUNTER — Encounter (INDEPENDENT_AMBULATORY_CARE_PROVIDER_SITE_OTHER): Payer: Self-pay

## 2013-07-28 ENCOUNTER — Ambulatory Visit (INDEPENDENT_AMBULATORY_CARE_PROVIDER_SITE_OTHER): Payer: Medicare Other | Admitting: Gastroenterology

## 2013-07-28 VITALS — BP 124/66 | HR 70 | Temp 97.3°F | Ht 70.0 in | Wt 193.4 lb

## 2013-07-28 DIAGNOSIS — B182 Chronic viral hepatitis C: Secondary | ICD-10-CM | POA: Diagnosis not present

## 2013-07-28 DIAGNOSIS — K7460 Unspecified cirrhosis of liver: Secondary | ICD-10-CM | POA: Diagnosis not present

## 2013-07-28 DIAGNOSIS — I2510 Atherosclerotic heart disease of native coronary artery without angina pectoris: Secondary | ICD-10-CM | POA: Diagnosis not present

## 2013-07-28 NOTE — Assessment & Plan Note (Signed)
WELL COMPENSATED DISEASE.  ENCOURAGED PT TO COMPLETE FIBROSPECT AND THEN WILL MOVE FORWARD WITH HCV THERAPY PAPERWORK. PT WILL LET ME KNOW WHEN HE WANTS TO START. FOLLOW UP IN SEP 2015.
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
## 2013-07-28 NOTE — Patient Instructions (Signed)
COMPLETE FIBROSPECT TEST.  I WILL SEE YOU IN SEP 2015.

## 2013-08-01 NOTE — Progress Notes (Signed)
Reminder in EPIC 

## 2013-08-08 ENCOUNTER — Encounter: Payer: Self-pay | Admitting: Internal Medicine

## 2013-08-08 ENCOUNTER — Ambulatory Visit (INDEPENDENT_AMBULATORY_CARE_PROVIDER_SITE_OTHER): Payer: Medicare Other | Admitting: *Deleted

## 2013-08-08 ENCOUNTER — Ambulatory Visit: Payer: Medicare Other | Admitting: Family Medicine

## 2013-08-08 ENCOUNTER — Telehealth: Payer: Self-pay | Admitting: Cardiology

## 2013-08-08 DIAGNOSIS — I498 Other specified cardiac arrhythmias: Secondary | ICD-10-CM | POA: Diagnosis not present

## 2013-08-08 DIAGNOSIS — Z950 Presence of cardiac pacemaker: Secondary | ICD-10-CM

## 2013-08-08 LAB — MDC_IDC_ENUM_SESS_TYPE_REMOTE
Battery Remaining Longevity: 105 mo
Battery Voltage: 2.79 V
Brady Statistic AP VS Percent: 86 %
Brady Statistic AS VP Percent: 0 %
Brady Statistic AS VS Percent: 14 %
Lead Channel Pacing Threshold Amplitude: 0.5 V
Lead Channel Pacing Threshold Amplitude: 1.125 V
Lead Channel Pacing Threshold Pulse Width: 0.4 ms
Lead Channel Pacing Threshold Pulse Width: 0.4 ms
Lead Channel Sensing Intrinsic Amplitude: 11.2 mV
Lead Channel Setting Pacing Amplitude: 1.5 V
Lead Channel Setting Pacing Pulse Width: 0.4 ms
Lead Channel Setting Sensing Sensitivity: 2 mV
MDC IDC MSMT BATTERY IMPEDANCE: 346 Ohm
MDC IDC MSMT LEADCHNL RA IMPEDANCE VALUE: 444 Ohm
MDC IDC MSMT LEADCHNL RV IMPEDANCE VALUE: 515 Ohm
MDC IDC SESS DTM: 20150706234914
MDC IDC SET LEADCHNL RV PACING AMPLITUDE: 2.75 V
MDC IDC STAT BRADY AP VP PERCENT: 0 %

## 2013-08-08 NOTE — Telephone Encounter (Signed)
Confirmed remote transmission with pt daughter.  

## 2013-08-09 DIAGNOSIS — B351 Tinea unguium: Secondary | ICD-10-CM | POA: Diagnosis not present

## 2013-08-09 DIAGNOSIS — L851 Acquired keratosis [keratoderma] palmaris et plantaris: Secondary | ICD-10-CM | POA: Diagnosis not present

## 2013-08-09 DIAGNOSIS — E1149 Type 2 diabetes mellitus with other diabetic neurological complication: Secondary | ICD-10-CM | POA: Diagnosis not present

## 2013-08-09 NOTE — Progress Notes (Signed)
Remote pacemaker transmission.   

## 2013-08-16 ENCOUNTER — Ambulatory Visit (INDEPENDENT_AMBULATORY_CARE_PROVIDER_SITE_OTHER): Payer: Medicare Other | Admitting: Family Medicine

## 2013-08-16 ENCOUNTER — Encounter: Payer: Self-pay | Admitting: Family Medicine

## 2013-08-16 VITALS — BP 110/62 | HR 68 | Temp 97.2°F | Resp 14 | Ht 70.0 in | Wt 182.0 lb

## 2013-08-16 DIAGNOSIS — E1165 Type 2 diabetes mellitus with hyperglycemia: Principal | ICD-10-CM

## 2013-08-16 DIAGNOSIS — IMO0001 Reserved for inherently not codable concepts without codable children: Secondary | ICD-10-CM | POA: Diagnosis not present

## 2013-08-16 DIAGNOSIS — Z23 Encounter for immunization: Secondary | ICD-10-CM | POA: Diagnosis not present

## 2013-08-16 DIAGNOSIS — Z125 Encounter for screening for malignant neoplasm of prostate: Secondary | ICD-10-CM

## 2013-08-16 DIAGNOSIS — Z0000 Encounter for general adult medical examination without abnormal findings: Secondary | ICD-10-CM

## 2013-08-16 DIAGNOSIS — E785 Hyperlipidemia, unspecified: Secondary | ICD-10-CM

## 2013-08-16 DIAGNOSIS — I2510 Atherosclerotic heart disease of native coronary artery without angina pectoris: Secondary | ICD-10-CM | POA: Diagnosis not present

## 2013-08-16 DIAGNOSIS — N400 Enlarged prostate without lower urinary tract symptoms: Secondary | ICD-10-CM

## 2013-08-16 DIAGNOSIS — B182 Chronic viral hepatitis C: Secondary | ICD-10-CM

## 2013-08-16 DIAGNOSIS — I10 Essential (primary) hypertension: Secondary | ICD-10-CM

## 2013-08-16 LAB — CBC WITH DIFFERENTIAL/PLATELET
Basophils Absolute: 0.1 10*3/uL (ref 0.0–0.1)
Basophils Relative: 1 % (ref 0–1)
EOS ABS: 0.3 10*3/uL (ref 0.0–0.7)
Eosinophils Relative: 5 % (ref 0–5)
HCT: 39.7 % (ref 39.0–52.0)
HEMOGLOBIN: 14 g/dL (ref 13.0–17.0)
Lymphocytes Relative: 23 % (ref 12–46)
Lymphs Abs: 1.2 10*3/uL (ref 0.7–4.0)
MCH: 30.2 pg (ref 26.0–34.0)
MCHC: 35.3 g/dL (ref 30.0–36.0)
MCV: 85.7 fL (ref 78.0–100.0)
MONOS PCT: 10 % (ref 3–12)
Monocytes Absolute: 0.5 10*3/uL (ref 0.1–1.0)
NEUTROS PCT: 61 % (ref 43–77)
Neutro Abs: 3.2 10*3/uL (ref 1.7–7.7)
Platelets: 85 10*3/uL — ABNORMAL LOW (ref 150–400)
RBC: 4.63 MIL/uL (ref 4.22–5.81)
RDW: 14.8 % (ref 11.5–15.5)
WBC: 5.2 10*3/uL (ref 4.0–10.5)

## 2013-08-16 LAB — LIPID PANEL
CHOL/HDL RATIO: 3 ratio
Cholesterol: 159 mg/dL (ref 0–200)
HDL: 53 mg/dL (ref >39)
LDL Cholesterol: 88 mg/dL (ref 0–99)
Triglycerides: 91 mg/dL (ref <150)
VLDL: 18 mg/dL (ref 0–40)

## 2013-08-16 LAB — COMPLETE METABOLIC PANEL WITH GFR
ALT: 133 U/L — ABNORMAL HIGH (ref 0–53)
AST: 110 U/L — ABNORMAL HIGH (ref 0–37)
Albumin: 3.6 g/dL (ref 3.5–5.2)
Alkaline Phosphatase: 123 U/L — ABNORMAL HIGH (ref 39–117)
BUN: 24 mg/dL — AB (ref 6–23)
CALCIUM: 9.4 mg/dL (ref 8.4–10.5)
CHLORIDE: 105 meq/L (ref 96–112)
CO2: 28 mEq/L (ref 19–32)
Creat: 0.77 mg/dL (ref 0.50–1.35)
GFR, Est Non African American: >89 mL/min
Glucose, Bld: 120 mg/dL — ABNORMAL HIGH (ref 70–99)
Potassium: 4.5 mEq/L (ref 3.5–5.3)
Sodium: 140 mEq/L (ref 135–145)
Total Bilirubin: 1.1 mg/dL (ref 0.2–1.2)
Total Protein: 7 g/dL (ref 6.0–8.3)

## 2013-08-16 LAB — MICROALBUMIN, URINE: MICROALB UR: 1.24 mg/dL (ref 0.00–1.89)

## 2013-08-16 LAB — HEMOGLOBIN A1C
HEMOGLOBIN A1C: 7 % — AB (ref <5.7)
Mean Plasma Glucose: 154 mg/dL — ABNORMAL HIGH (ref <117)

## 2013-08-16 MED ORDER — TERAZOSIN HCL 5 MG PO CAPS: 5.0000 mg | ORAL_CAPSULE | Freq: Every evening | ORAL | Status: DC

## 2013-08-16 NOTE — Addendum Note (Signed)
Addended by: Shary Decamp B on: 08/16/2013 11:15 AM   Modules accepted: Orders
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
## 2013-08-17 LAB — PSA, MEDICARE: PSA: 2.26 ng/mL (ref <=4.00)

## 2013-08-19 ENCOUNTER — Encounter: Payer: Self-pay | Admitting: Family Medicine

## 2013-08-22 ENCOUNTER — Encounter: Payer: Self-pay | Admitting: Cardiology

## 2013-08-31 ENCOUNTER — Encounter: Payer: Self-pay | Admitting: Gastroenterology

## 2013-08-31 ENCOUNTER — Telehealth: Payer: Self-pay | Admitting: Gastroenterology

## 2013-08-31 NOTE — Telephone Encounter (Signed)
Pt is on the July recall to be scheduled for an U/S

## 2013-08-31 NOTE — Telephone Encounter (Signed)
Letter has been mailed to patient

## 2013-09-06 ENCOUNTER — Other Ambulatory Visit: Payer: Self-pay | Admitting: Family Medicine

## 2013-09-06 ENCOUNTER — Other Ambulatory Visit: Payer: Self-pay | Admitting: Gastroenterology

## 2013-09-06 DIAGNOSIS — K7460 Unspecified cirrhosis of liver: Secondary | ICD-10-CM | POA: Diagnosis not present

## 2013-09-06 DIAGNOSIS — B182 Chronic viral hepatitis C: Secondary | ICD-10-CM | POA: Diagnosis not present

## 2013-09-06 DIAGNOSIS — E1159 Type 2 diabetes mellitus with other circulatory complications: Secondary | ICD-10-CM

## 2013-09-06 LAB — CBC WITH DIFFERENTIAL/PLATELET
Basophils Absolute: 0.1 10*3/uL (ref 0.0–0.1)
Basophils Relative: 1 % (ref 0–1)
Eosinophils Absolute: 0.2 10*3/uL (ref 0.0–0.7)
Eosinophils Relative: 3 % (ref 0–5)
HCT: 38.4 % — ABNORMAL LOW (ref 39.0–52.0)
HEMOGLOBIN: 13.5 g/dL (ref 13.0–17.0)
LYMPHS ABS: 1.5 10*3/uL (ref 0.7–4.0)
LYMPHS PCT: 22 % (ref 12–46)
MCH: 30.5 pg (ref 26.0–34.0)
MCHC: 35.2 g/dL (ref 30.0–36.0)
MCV: 86.7 fL (ref 78.0–100.0)
MONOS PCT: 12 % (ref 3–12)
Monocytes Absolute: 0.8 10*3/uL (ref 0.1–1.0)
NEUTROS ABS: 4.3 10*3/uL (ref 1.7–7.7)
NEUTROS PCT: 62 % (ref 43–77)
Platelets: 86 10*3/uL — ABNORMAL LOW (ref 150–400)
RBC: 4.43 MIL/uL (ref 4.22–5.81)
RDW: 14.8 % (ref 11.5–15.5)
WBC: 7 10*3/uL (ref 4.0–10.5)

## 2013-09-06 LAB — AFP TUMOR MARKER: AFP TUMOR MARKER: 14.6 ng/mL — AB (ref 0.0–8.0)

## 2013-09-06 LAB — PROTIME-INR
INR: 1.04 (ref <1.50)
PROTHROMBIN TIME: 13.6 s (ref 11.6–15.2)

## 2013-09-06 MED ORDER — GLYBURIDE 2.5 MG PO TABS: 2.5000 mg | ORAL_TABLET | Freq: Every day | ORAL | Status: DC

## 2013-09-06 NOTE — Telephone Encounter (Signed)
Med sent to pharm 

## 2013-09-12 ENCOUNTER — Ambulatory Visit (HOSPITAL_COMMUNITY)
Admission: RE | Admit: 2013-09-12 | Discharge: 2013-09-12 | Disposition: A | Discharge status: Home / Self Care | Payer: Medicare Other | Source: Ambulatory Visit | Attending: Gastroenterology | Admitting: Gastroenterology

## 2013-09-12 DIAGNOSIS — K7460 Unspecified cirrhosis of liver: Secondary | ICD-10-CM | POA: Insufficient documentation

## 2013-09-12 DIAGNOSIS — R161 Splenomegaly, not elsewhere classified: Secondary | ICD-10-CM | POA: Insufficient documentation

## 2013-09-12 DIAGNOSIS — B1920 Unspecified viral hepatitis C without hepatic coma: Secondary | ICD-10-CM | POA: Diagnosis not present

## 2013-09-12 DIAGNOSIS — B182 Chronic viral hepatitis C: Secondary | ICD-10-CM | POA: Diagnosis not present

## 2013-09-24 ENCOUNTER — Telehealth: Payer: Self-pay | Admitting: Gastroenterology

## 2013-09-24 NOTE — Telephone Encounter (Signed)
PLEASE CALL PT. HIS LABS ARE UNCHANGED. HIS U/S SHOWS CIRRHOSIS. REPEAT LABS/RUQ U/S IN 6 MOS. OPV SEP 2015 E30 HCV/CIRRHOSIS.

## 2013-09-26 ENCOUNTER — Other Ambulatory Visit: Payer: Self-pay

## 2013-09-26 DIAGNOSIS — K7460 Unspecified cirrhosis of liver: Principal | ICD-10-CM

## 2013-09-26 DIAGNOSIS — B182 Chronic viral hepatitis C: Secondary | ICD-10-CM

## 2013-09-27 ENCOUNTER — Encounter: Payer: Self-pay | Admitting: Gastroenterology

## 2013-09-27 NOTE — Telephone Encounter (Signed)
Pt is aware of OV with SF on 10/21 at 1030 and appt card mailed. Reminder in epic for repeat labs and U/S in 6 months

## 2013-09-27 NOTE — Telephone Encounter (Signed)
Pt is aware and lab orders are on file for 6 months.

## 2013-10-06 ENCOUNTER — Encounter: Payer: Self-pay | Admitting: Gastroenterology

## 2013-10-14 DIAGNOSIS — E1149 Type 2 diabetes mellitus with other diabetic neurological complication: Secondary | ICD-10-CM | POA: Diagnosis not present

## 2013-10-14 DIAGNOSIS — L851 Acquired keratosis [keratoderma] palmaris et plantaris: Secondary | ICD-10-CM | POA: Diagnosis not present

## 2013-10-14 DIAGNOSIS — B351 Tinea unguium: Secondary | ICD-10-CM | POA: Diagnosis not present

## 2013-10-19 ENCOUNTER — Other Ambulatory Visit: Payer: Self-pay | Admitting: Family Medicine

## 2013-10-19 ENCOUNTER — Telehealth: Payer: Self-pay | Admitting: Family Medicine

## 2013-11-08 ENCOUNTER — Telehealth: Payer: Self-pay | Admitting: Family Medicine

## 2013-11-08 DIAGNOSIS — I10 Essential (primary) hypertension: Secondary | ICD-10-CM

## 2013-11-08 DIAGNOSIS — E538 Deficiency of other specified B group vitamins: Secondary | ICD-10-CM

## 2013-11-08 DIAGNOSIS — E119 Type 2 diabetes mellitus without complications: Secondary | ICD-10-CM

## 2013-11-08 MED ORDER — LISINOPRIL 2.5 MG PO TABS: 2.5000 mg | ORAL_TABLET | Freq: Every day | ORAL | Status: DC

## 2013-11-08 MED ORDER — METOPROLOL TARTRATE 50 MG PO TABS: 50.0000 mg | ORAL_TABLET | Freq: Two times a day (BID) | ORAL | Status: DC

## 2013-11-08 MED ORDER — METFORMIN HCL 500 MG PO TABS: 500.0000 mg | ORAL_TABLET | Freq: Two times a day (BID) | ORAL | Status: DC

## 2013-11-08 MED ORDER — OMEPRAZOLE 20 MG PO CPDR: 20.0000 mg | DELAYED_RELEASE_CAPSULE | Freq: Every day | ORAL | Status: DC

## 2013-11-08 MED ORDER — FOLIC ACID 1 MG PO TABS: 1.0000 mg | ORAL_TABLET | Freq: Every day | ORAL | Status: DC

## 2013-11-08 NOTE — Telephone Encounter (Signed)
[phone] [name] [name] is needing refills on these medications because they do not have Dr Antony Contras name on them they have his old dr Dr Margaree Mackintosh name on them  metoprolol (LOPRESSOR) 50 MG tablet metFORMIN (GLUCOPHAGE) 500 MG tablet lisinopril (PRINIVIL,ZESTRIL) 2.5 MG tablet omeprazole (PRILOSEC) 20 MG capsule folic acid (FOLVITE) 1 MG tablet

## 2013-11-08 NOTE — Telephone Encounter (Signed)
meds sent to pharm

## 2013-11-09 ENCOUNTER — Ambulatory Visit (INDEPENDENT_AMBULATORY_CARE_PROVIDER_SITE_OTHER): Payer: Medicare Other | Admitting: *Deleted

## 2013-11-09 ENCOUNTER — Encounter: Payer: Self-pay | Admitting: Internal Medicine

## 2013-11-09 DIAGNOSIS — I499 Cardiac arrhythmia, unspecified: Secondary | ICD-10-CM

## 2013-11-09 LAB — MDC_IDC_ENUM_SESS_TYPE_REMOTE
Battery Remaining Longevity: 100 mo
Battery Voltage: 2.8 V
Brady Statistic AP VP Percent: 0 %
Brady Statistic AP VS Percent: 85 %
Lead Channel Pacing Threshold Amplitude: 2.25 V
Lead Channel Pacing Threshold Pulse Width: 0.4 ms
Lead Channel Pacing Threshold Pulse Width: 0.4 ms
Lead Channel Setting Pacing Amplitude: 1.5 V
Lead Channel Setting Pacing Pulse Width: 0.4 ms
Lead Channel Setting Sensing Sensitivity: 2 mV
MDC IDC MSMT BATTERY IMPEDANCE: 420 Ohm
MDC IDC MSMT LEADCHNL RA IMPEDANCE VALUE: 450 Ohm
MDC IDC MSMT LEADCHNL RA PACING THRESHOLD AMPLITUDE: 0.375 V
MDC IDC MSMT LEADCHNL RV IMPEDANCE VALUE: 538 Ohm
MDC IDC MSMT LEADCHNL RV SENSING INTR AMPL: 5.6 mV
MDC IDC SESS DTM: 20151007115330
MDC IDC SET LEADCHNL RV PACING AMPLITUDE: 4.5 V
MDC IDC STAT BRADY AS VP PERCENT: 0 %
MDC IDC STAT BRADY AS VS PERCENT: 15 %

## 2013-11-09 NOTE — Progress Notes (Signed)
Remote pacemaker transmission.   

## 2013-11-23 ENCOUNTER — Ambulatory Visit (INDEPENDENT_AMBULATORY_CARE_PROVIDER_SITE_OTHER): Payer: Medicare Other | Admitting: Gastroenterology

## 2013-11-23 ENCOUNTER — Encounter (INDEPENDENT_AMBULATORY_CARE_PROVIDER_SITE_OTHER): Payer: Self-pay

## 2013-11-23 ENCOUNTER — Encounter: Payer: Self-pay | Admitting: Gastroenterology

## 2013-11-23 ENCOUNTER — Other Ambulatory Visit: Payer: Self-pay | Admitting: Gastroenterology

## 2013-11-23 VITALS — BP 131/68 | HR 78 | Temp 98.3°F | Ht 70.0 in | Wt 191.6 lb

## 2013-11-23 DIAGNOSIS — B182 Chronic viral hepatitis C: Secondary | ICD-10-CM | POA: Diagnosis not present

## 2013-11-23 DIAGNOSIS — K7460 Unspecified cirrhosis of liver: Principal | ICD-10-CM

## 2013-11-23 DIAGNOSIS — I2510 Atherosclerotic heart disease of native coronary artery without angina pectoris: Secondary | ICD-10-CM | POA: Diagnosis not present

## 2013-11-23 NOTE — Patient Instructions (Addendum)
COMPLETE LABS TODAY. THEY MAY TAKE 2-3 WEEKS TO RETURN.  AFTER THE LABS RESULTS ARE AVAILABLE I WILL SUBMIT REQUEST FOR YOUR MEDS(HARVONI). YOU WILL NEED THE MEDS FOR 2-3 MOS.  FOLLOW UP IN 3 MOS.
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
## 2013-11-23 NOTE — Assessment & Plan Note (Addendum)
INTERESTED IN HEP C RX.  FIBROSCPECT PENDING. NEEDS HEP C GENOTYPE AND QUANTITATIVE. PLAN TO START HARVONI AND PT WILL NEED FOR 12 WEEKS. PT HAD DRUG INTERACTIONS FOR VIEKIRA PAK FOLLOW UP IN 3 MOS.

## 2013-11-23 NOTE — Addendum Note (Signed)
Addended by: Claudina Lick on: 11/23/2013 11:20 AM   Modules accepted: Orders

## 2013-11-24 LAB — PROMETHEUS-MAIL

## 2013-11-24 LAB — HEPATITIS C RNA QUANTITATIVE
HCV Quantitative Log: 4.26 {Log} — ABNORMAL HIGH (ref <1.18)
HCV Quantitative: 18120 IU/mL — ABNORMAL HIGH (ref <15)

## 2013-11-25 LAB — HEPATITIS C GENOTYPE

## 2013-11-25 NOTE — Progress Notes (Signed)
cc'ed to pcp °

## 2013-12-16 ENCOUNTER — Other Ambulatory Visit: Payer: Self-pay | Admitting: Family Medicine

## 2013-12-23 ENCOUNTER — Encounter: Payer: Self-pay | Admitting: Cardiology

## 2013-12-23 DIAGNOSIS — L309 Dermatitis, unspecified: Secondary | ICD-10-CM | POA: Diagnosis not present

## 2013-12-23 DIAGNOSIS — E1342 Other specified diabetes mellitus with diabetic polyneuropathy: Secondary | ICD-10-CM | POA: Diagnosis not present

## 2013-12-23 DIAGNOSIS — L851 Acquired keratosis [keratoderma] palmaris et plantaris: Secondary | ICD-10-CM | POA: Diagnosis not present

## 2013-12-23 DIAGNOSIS — B351 Tinea unguium: Secondary | ICD-10-CM | POA: Diagnosis not present

## 2014-01-02 ENCOUNTER — Telehealth: Payer: Self-pay | Admitting: Gastroenterology

## 2014-01-02 NOTE — Telephone Encounter (Signed)
Patient called to check on lab results regarding getting him on Harvoni. Patient said that he hasn't heard from [place] and is following up. Please advise and call [phone]

## 2014-01-02 NOTE — Telephone Encounter (Signed)
Routing to Dr. Fields for results.  

## 2014-01-03 ENCOUNTER — Ambulatory Visit: Payer: Medicare Other

## 2014-01-05 ENCOUNTER — Encounter: Payer: Self-pay | Admitting: Internal Medicine

## 2014-01-05 ENCOUNTER — Ambulatory Visit (INDEPENDENT_AMBULATORY_CARE_PROVIDER_SITE_OTHER): Payer: Medicare Other | Admitting: Internal Medicine

## 2014-01-05 VITALS — BP 132/64 | HR 72 | Ht 70.0 in | Wt 196.0 lb

## 2014-01-05 DIAGNOSIS — I2510 Atherosclerotic heart disease of native coronary artery without angina pectoris: Secondary | ICD-10-CM

## 2014-01-05 DIAGNOSIS — I10 Essential (primary) hypertension: Secondary | ICD-10-CM

## 2014-01-05 DIAGNOSIS — Z950 Presence of cardiac pacemaker: Secondary | ICD-10-CM

## 2014-01-05 LAB — MDC_IDC_ENUM_SESS_TYPE_INCLINIC
Battery Impedance: 371 Ohm
Battery Remaining Longevity: 103 mo
Battery Voltage: 2.8 V
Brady Statistic AP VP Percent: 0 %
Brady Statistic AP VS Percent: 85 %
Brady Statistic AS VP Percent: 0 %
Date Time Interrogation Session: 20151203154137
Lead Channel Impedance Value: 400 Ohm
Lead Channel Pacing Threshold Amplitude: 0.5 V
Lead Channel Pacing Threshold Pulse Width: 0.4 ms
Lead Channel Sensing Intrinsic Amplitude: 2.8 mV
Lead Channel Setting Pacing Amplitude: 2.5 V
MDC IDC MSMT LEADCHNL RV IMPEDANCE VALUE: 493 Ohm
MDC IDC MSMT LEADCHNL RV PACING THRESHOLD AMPLITUDE: 1.25 V
MDC IDC MSMT LEADCHNL RV PACING THRESHOLD PULSEWIDTH: 0.76 ms
MDC IDC MSMT LEADCHNL RV SENSING INTR AMPL: 4 mV
MDC IDC SET LEADCHNL RA PACING AMPLITUDE: 2 V
MDC IDC SET LEADCHNL RV PACING PULSEWIDTH: 0.76 ms
MDC IDC SET LEADCHNL RV SENSING SENSITIVITY: 2 mV
MDC IDC STAT BRADY AS VS PERCENT: 14 %
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
## 2014-01-05 NOTE — Telephone Encounter (Addendum)
DRUG INTERACTIONS REVIEWED WITH [name] [name]. VIEKIRA INTERACTS WITH GLYBURIDE AND LIPITOR.

## 2014-01-05 NOTE — Telephone Encounter (Addendum)
PLEASE CALL PT. HIS BLOOD TEST SHOWS CIRRHOSIS. WE HAVE SUBMITTED THE PAPERWORK FOR HIM TO GET HARVONI ONE PILL DAILY FOR 12 WEEKS. CALL IN 2 WEEKS IF YOU HAVE NOT RECEIVED THE MEDICINE. MED SIDE EFFECTS INCLUDE HEADACHES, MILD TO SEVERE FATIGUE OR WEAKNESS, AND SUICIDAL THOUGHTS OR BEHAVIOR.

## 2014-01-05 NOTE — Assessment & Plan Note (Signed)
His blood pressure is well controlled. No change in meds. He is encouraged to increase his physical activity.

## 2014-01-05 NOTE — Patient Instructions (Signed)
Your physician wants you to follow-up in: [place] will receive a reminder letter in the mail two months in advance. If you don't receive a letter, please call our office to schedule the follow-up appointment.  Your physician recommends that you continue on your current medications as directed. Please refer to the Current Medication list given to you today.  Remote monitoring is used to monitor your Pacemaker of ICD from home. This monitoring reduces the number of office visits required to check your device to one time per year. It allows [place] to keep an eye on the functioning of your device to ensure it is working properly. You are scheduled for a device check from home on [facility] 3RD. You may send your transmission at any time that day. If you have a wireless device, the transmission will be sent automatically. After your physician reviews your transmission, you will receive a postcard with your next transmission date.  Thank you for choosing [facility]!!

## 2014-01-05 NOTE — Assessment & Plan Note (Signed)
His medtronic DDD PM is working normally. Will recheck in several months. 

## 2014-01-06 ENCOUNTER — Encounter: Payer: Self-pay | Admitting: Internal Medicine

## 2014-01-06 LAB — PACEMAKER DEVICE OBSERVATION

## 2014-01-06 NOTE — Telephone Encounter (Signed)
CALLED PT'S DAUGHTER. DISCUSSed RESULTS. CONFIRMED PT IS Rx NAIVE. PLAN FOR HARVONI FOR 34 WEEKS. HCV RNA IN 4 WEEKS.

## 2014-01-11 ENCOUNTER — Other Ambulatory Visit: Payer: Self-pay | Admitting: Gastroenterology

## 2014-01-11 ENCOUNTER — Other Ambulatory Visit: Payer: Self-pay

## 2014-01-11 DIAGNOSIS — B182 Chronic viral hepatitis C: Secondary | ICD-10-CM

## 2014-01-11 NOTE — Telephone Encounter (Signed)
Lab order done 

## 2014-01-11 NOTE — Telephone Encounter (Signed)
All information has been faxed to [place]

## 2014-01-12 ENCOUNTER — Ambulatory Visit (INDEPENDENT_AMBULATORY_CARE_PROVIDER_SITE_OTHER): Payer: Medicare Other | Admitting: *Deleted

## 2014-01-12 DIAGNOSIS — Z23 Encounter for immunization: Secondary | ICD-10-CM | POA: Diagnosis not present

## 2014-01-16 ENCOUNTER — Other Ambulatory Visit: Payer: Self-pay | Admitting: Family Medicine

## 2014-01-19 ENCOUNTER — Ambulatory Visit (INDEPENDENT_AMBULATORY_CARE_PROVIDER_SITE_OTHER): Payer: Medicare Other | Admitting: Family Medicine

## 2014-01-19 ENCOUNTER — Encounter: Payer: Self-pay | Admitting: Family Medicine

## 2014-01-19 VITALS — BP 130/68 | HR 68 | Temp 97.5°F | Resp 14 | Ht 70.0 in | Wt 195.0 lb

## 2014-01-19 DIAGNOSIS — E785 Hyperlipidemia, unspecified: Secondary | ICD-10-CM | POA: Diagnosis not present

## 2014-01-19 DIAGNOSIS — I10 Essential (primary) hypertension: Secondary | ICD-10-CM

## 2014-01-19 DIAGNOSIS — E119 Type 2 diabetes mellitus without complications: Secondary | ICD-10-CM | POA: Diagnosis not present

## 2014-01-19 DIAGNOSIS — I2510 Atherosclerotic heart disease of native coronary artery without angina pectoris: Secondary | ICD-10-CM

## 2014-01-19 LAB — COMPLETE METABOLIC PANEL WITH GFR
ALK PHOS: 103 U/L (ref 39–117)
ALT: 51 U/L (ref 0–53)
AST: 54 U/L — ABNORMAL HIGH (ref 0–37)
Albumin: 3.5 g/dL (ref 3.5–5.2)
BILIRUBIN TOTAL: 0.7 mg/dL (ref 0.2–1.2)
BUN: 20 mg/dL (ref 6–23)
CO2: 24 mEq/L (ref 19–32)
Calcium: 9.3 mg/dL (ref 8.4–10.5)
Chloride: 105 mEq/L (ref 96–112)
Creat: 0.83 mg/dL (ref 0.50–1.35)
GFR, Est Non African American: 87 mL/min
Glucose, Bld: 102 mg/dL — ABNORMAL HIGH (ref 70–99)
Potassium: 4.5 mEq/L (ref 3.5–5.3)
Sodium: 138 mEq/L (ref 135–145)
Total Protein: 6.8 g/dL (ref 6.0–8.3)

## 2014-01-19 LAB — LIPID PANEL
CHOL/HDL RATIO: 3.2 ratio
Cholesterol: 123 mg/dL (ref 0–200)
HDL: 39 mg/dL — AB (ref >39)
LDL CALC: 71 mg/dL (ref 0–99)
TRIGLYCERIDES: 66 mg/dL (ref <150)
VLDL: 13 mg/dL (ref 0–40)

## 2014-01-19 LAB — HEMOGLOBIN A1C
Hgb A1c MFr Bld: 6 % — ABNORMAL HIGH (ref <5.7)
Mean Plasma Glucose: 126 mg/dL — ABNORMAL HIGH (ref <117)

## 2014-01-19 MED ORDER — MOMETASONE FUROATE 0.1 % EX OINT: TOPICAL_OINTMENT | Freq: Every day | CUTANEOUS | Status: DC
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
## 2014-01-20 LAB — MICROALBUMIN, URINE: Microalb, Ur: 0.4 mg/dL (ref <2.0)

## 2014-01-23 ENCOUNTER — Encounter: Payer: Self-pay | Admitting: *Deleted

## 2014-01-30 ENCOUNTER — Encounter: Payer: Self-pay | Admitting: Gastroenterology

## 2014-02-08 ENCOUNTER — Telehealth: Payer: Self-pay

## 2014-02-08 NOTE — Telephone Encounter (Signed)
REVIEWED-NO ADDITIONAL RECOMMENDATIONS. 

## 2014-02-08 NOTE — Telephone Encounter (Signed)
pts harvoni has arrived today. Called and informed daughter and she will bring him tomorrow and pick it up. All instructions, bioplus information and lab orders to be done in 4 weeks are with the medication at the front desk. Pt will need to sign paper work for medication.

## 2014-02-08 NOTE — Telephone Encounter (Signed)
MADE PATIENT A FOLLOW UP APPOINTMENT AND PUT APPOINTMENT CARD WITH HARVONI

## 2014-02-16 ENCOUNTER — Other Ambulatory Visit: Payer: Self-pay | Admitting: Family Medicine

## 2014-02-16 NOTE — Telephone Encounter (Signed)
Medication refilled per protocol. 

## 2014-03-02 ENCOUNTER — Other Ambulatory Visit: Payer: Self-pay | Admitting: Family Medicine

## 2014-03-02 ENCOUNTER — Other Ambulatory Visit: Payer: Self-pay

## 2014-03-02 ENCOUNTER — Encounter: Payer: Self-pay | Admitting: Family Medicine

## 2014-03-02 ENCOUNTER — Ambulatory Visit (INDEPENDENT_AMBULATORY_CARE_PROVIDER_SITE_OTHER): Payer: Medicare Other | Admitting: Family Medicine

## 2014-03-02 VITALS — BP 138/76 | HR 82 | Temp 98.1°F | Resp 14 | Ht 68.11 in | Wt 209.0 lb

## 2014-03-02 DIAGNOSIS — R0600 Dyspnea, unspecified: Secondary | ICD-10-CM | POA: Diagnosis not present

## 2014-03-02 DIAGNOSIS — B182 Chronic viral hepatitis C: Secondary | ICD-10-CM

## 2014-03-02 DIAGNOSIS — R609 Edema, unspecified: Secondary | ICD-10-CM

## 2014-03-02 DIAGNOSIS — E538 Deficiency of other specified B group vitamins: Secondary | ICD-10-CM | POA: Diagnosis not present

## 2014-03-02 DIAGNOSIS — K7460 Unspecified cirrhosis of liver: Principal | ICD-10-CM

## 2014-03-02 DIAGNOSIS — D649 Anemia, unspecified: Secondary | ICD-10-CM | POA: Diagnosis not present

## 2014-03-02 MED ORDER — FUROSEMIDE 40 MG PO TABS: 40.0000 mg | ORAL_TABLET | Freq: Every day | ORAL | Status: DC

## 2014-03-02 MED ORDER — POTASSIUM CHLORIDE CRYS ER 20 MEQ PO TBCR: 20.0000 meq | EXTENDED_RELEASE_TABLET | Freq: Every day | ORAL | Status: DC
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
## 2014-03-03 ENCOUNTER — Telehealth: Payer: Self-pay | Admitting: *Deleted

## 2014-03-03 ENCOUNTER — Other Ambulatory Visit: Payer: Self-pay | Admitting: *Deleted

## 2014-03-03 LAB — CBC WITH DIFFERENTIAL/PLATELET
BASOS PCT: 1 % (ref 0–1)
Basophils Absolute: 0 10*3/uL (ref 0.0–0.1)
EOS PCT: 3 % (ref 0–5)
Eosinophils Absolute: 0.1 10*3/uL (ref 0.0–0.7)
HCT: 27.5 % — ABNORMAL LOW (ref 39.0–52.0)
Hemoglobin: 8.8 g/dL — CL (ref 13.0–17.0)
LYMPHS PCT: 27 % (ref 12–46)
Lymphs Abs: 0.9 10*3/uL (ref 0.7–4.0)
MCH: 27.1 pg (ref 26.0–34.0)
MCHC: 32 g/dL (ref 30.0–36.0)
MCV: 84.6 fL (ref 78.0–100.0)
MONOS PCT: 11 % (ref 3–12)
Monocytes Absolute: 0.4 10*3/uL (ref 0.1–1.0)
NEUTROS ABS: 1.9 10*3/uL (ref 1.7–7.7)
Neutrophils Relative %: 58 % (ref 43–77)
Platelets: 67 10*3/uL — ABNORMAL LOW (ref 150–400)
RBC: 3.25 MIL/uL — ABNORMAL LOW (ref 4.22–5.81)
RDW: 14.9 % (ref 11.5–15.5)
WBC: 3.3 10*3/uL — ABNORMAL LOW (ref 4.0–10.5)

## 2014-03-03 LAB — COMPLETE METABOLIC PANEL WITH GFR
ALT: 15 U/L (ref 0–53)
AST: 23 U/L (ref 0–37)
Albumin: 3.4 g/dL — ABNORMAL LOW (ref 3.5–5.2)
Alkaline Phosphatase: 95 U/L (ref 39–117)
BILIRUBIN TOTAL: 0.5 mg/dL (ref 0.2–1.2)
BUN: 20 mg/dL (ref 6–23)
CO2: 27 mEq/L (ref 19–32)
CREATININE: 0.73 mg/dL (ref 0.50–1.35)
Calcium: 9.4 mg/dL (ref 8.4–10.5)
Chloride: 103 mEq/L (ref 96–112)
GFR, Est African American: >89 mL/min
GLUCOSE: 154 mg/dL — AB (ref 70–99)
Potassium: 4.5 mEq/L (ref 3.5–5.3)
Sodium: 136 mEq/L (ref 135–145)
Total Protein: 6.5 g/dL (ref 6.0–8.3)

## 2014-03-03 LAB — IRON AND TIBC
%SAT: 6 % — AB (ref 20–55)
Iron: 24 ug/dL — ABNORMAL LOW (ref 42–165)
TIBC: 424 ug/dL (ref 215–435)
UIBC: 400 ug/dL (ref 125–400)

## 2014-03-03 LAB — VITAMIN B12: Vitamin B-12: 380 pg/mL (ref 211–911)

## 2014-03-03 LAB — BRAIN NATRIURETIC PEPTIDE: BRAIN NATRIURETIC PEPTIDE: 166.7 pg/mL — AB (ref 0.0–100.0)

## 2014-03-03 MED ORDER — ATORVASTATIN CALCIUM 20 MG PO TABS: 20.0000 mg | ORAL_TABLET | Freq: Every day | ORAL | Status: DC

## 2014-03-03 NOTE — Telephone Encounter (Signed)
-----   Message from [name], MD sent at 03/03/2014  7:04 AM EST ----- Patient has become severely anemic.  Get stool cards x 3 ASAP and add ferritin and b12 levels to labwork.

## 2014-03-03 NOTE — Telephone Encounter (Signed)
escribed rx 

## 2014-03-03 NOTE — Telephone Encounter (Signed)
Received call from [place] at [facility] calling with a critical lab, pts hemoglobin is low 8.8 repeated and verified.

## 2014-03-03 NOTE — Telephone Encounter (Signed)
Spoke to patient about lab results.  Need to come and get hemoccult cards and return ASAP.  Explain very anemic and we need to find out why!!  Acknowledged understanding and will pick up cards today

## 2014-03-03 NOTE — Telephone Encounter (Signed)
[facility]  Atorvastatin 20 mg #90

## 2014-03-03 NOTE — Telephone Encounter (Signed)
Can we call him and get stool cards and labs asap.

## 2014-03-04 DIAGNOSIS — D649 Anemia, unspecified: Secondary | ICD-10-CM | POA: Diagnosis not present

## 2014-03-05 ENCOUNTER — Other Ambulatory Visit: Payer: Self-pay | Admitting: Family Medicine

## 2014-03-05 DIAGNOSIS — D649 Anemia, unspecified: Secondary | ICD-10-CM | POA: Diagnosis not present

## 2014-03-06 ENCOUNTER — Telehealth: Payer: Self-pay | Admitting: Gastroenterology

## 2014-03-06 ENCOUNTER — Other Ambulatory Visit: Payer: Medicare Other

## 2014-03-06 DIAGNOSIS — D649 Anemia, unspecified: Secondary | ICD-10-CM

## 2014-03-06 NOTE — Telephone Encounter (Signed)
PATIENT ON FEB RECALL FOR LABS AND RUQ [place]

## 2014-03-07 ENCOUNTER — Emergency Department (HOSPITAL_COMMUNITY)
Admission: EM | Admit: 2014-03-07 | Discharge: 2014-03-07 | Disposition: A | Discharge status: Home / Self Care | Payer: Medicare Other | Attending: Emergency Medicine | Admitting: Emergency Medicine

## 2014-03-07 ENCOUNTER — Encounter: Payer: Self-pay | Admitting: Family Medicine

## 2014-03-07 ENCOUNTER — Other Ambulatory Visit: Payer: Self-pay

## 2014-03-07 ENCOUNTER — Encounter (HOSPITAL_COMMUNITY): Payer: Self-pay | Admitting: Emergency Medicine

## 2014-03-07 ENCOUNTER — Ambulatory Visit (INDEPENDENT_AMBULATORY_CARE_PROVIDER_SITE_OTHER): Payer: Medicare Other | Admitting: Family Medicine

## 2014-03-07 VITALS — BP 80/40 | HR 60 | Temp 97.6°F | Resp 14 | Ht 70.0 in | Wt 196.0 lb

## 2014-03-07 DIAGNOSIS — Z9861 Coronary angioplasty status: Secondary | ICD-10-CM | POA: Diagnosis not present

## 2014-03-07 DIAGNOSIS — I10 Essential (primary) hypertension: Secondary | ICD-10-CM | POA: Diagnosis not present

## 2014-03-07 DIAGNOSIS — R609 Edema, unspecified: Secondary | ICD-10-CM

## 2014-03-07 DIAGNOSIS — E785 Hyperlipidemia, unspecified: Secondary | ICD-10-CM | POA: Insufficient documentation

## 2014-03-07 DIAGNOSIS — Z79899 Other long term (current) drug therapy: Secondary | ICD-10-CM | POA: Insufficient documentation

## 2014-03-07 DIAGNOSIS — Z87891 Personal history of nicotine dependence: Secondary | ICD-10-CM | POA: Diagnosis not present

## 2014-03-07 DIAGNOSIS — D649 Anemia, unspecified: Secondary | ICD-10-CM | POA: Insufficient documentation

## 2014-03-07 DIAGNOSIS — D500 Iron deficiency anemia secondary to blood loss (chronic): Secondary | ICD-10-CM

## 2014-03-07 DIAGNOSIS — E669 Obesity, unspecified: Secondary | ICD-10-CM | POA: Diagnosis not present

## 2014-03-07 DIAGNOSIS — Z7982 Long term (current) use of aspirin: Secondary | ICD-10-CM | POA: Diagnosis not present

## 2014-03-07 DIAGNOSIS — E860 Dehydration: Secondary | ICD-10-CM

## 2014-03-07 DIAGNOSIS — K7460 Unspecified cirrhosis of liver: Principal | ICD-10-CM

## 2014-03-07 DIAGNOSIS — B182 Chronic viral hepatitis C: Secondary | ICD-10-CM

## 2014-03-07 DIAGNOSIS — Z8619 Personal history of other infectious and parasitic diseases: Secondary | ICD-10-CM | POA: Insufficient documentation

## 2014-03-07 DIAGNOSIS — I951 Orthostatic hypotension: Secondary | ICD-10-CM | POA: Diagnosis not present

## 2014-03-07 DIAGNOSIS — I252 Old myocardial infarction: Secondary | ICD-10-CM | POA: Insufficient documentation

## 2014-03-07 LAB — CBC WITH DIFFERENTIAL/PLATELET
Basophils Absolute: 0 10*3/uL (ref 0.0–0.1)
Basophils Relative: 1 % (ref 0–1)
Eosinophils Absolute: 0.1 10*3/uL (ref 0.0–0.7)
Eosinophils Relative: 3 % (ref 0–5)
HCT: 31.3 % — ABNORMAL LOW (ref 39.0–52.0)
Hemoglobin: 10.1 g/dL — ABNORMAL LOW (ref 13.0–17.0)
LYMPHS ABS: 1.2 10*3/uL (ref 0.7–4.0)
LYMPHS PCT: 26 % (ref 12–46)
MCH: 27.6 pg (ref 26.0–34.0)
MCHC: 32.3 g/dL (ref 30.0–36.0)
MCV: 85.5 fL (ref 78.0–100.0)
MONOS PCT: 9 % (ref 3–12)
Monocytes Absolute: 0.4 10*3/uL (ref 0.1–1.0)
Neutro Abs: 2.8 10*3/uL (ref 1.7–7.7)
Neutrophils Relative %: 61 % (ref 43–77)
Platelets: 72 10*3/uL — ABNORMAL LOW (ref 150–400)
RBC: 3.66 MIL/uL — ABNORMAL LOW (ref 4.22–5.81)
RDW: 13.7 % (ref 11.5–15.5)
WBC: 4.5 10*3/uL (ref 4.0–10.5)

## 2014-03-07 LAB — COMPREHENSIVE METABOLIC PANEL
ALT: 25 U/L (ref 0–53)
AST: 38 U/L — AB (ref 0–37)
Albumin: 3.6 g/dL (ref 3.5–5.2)
Alkaline Phosphatase: 93 U/L (ref 39–117)
Anion gap: 6 (ref 5–15)
BILIRUBIN TOTAL: 0.6 mg/dL (ref 0.3–1.2)
BUN: 34 mg/dL — ABNORMAL HIGH (ref 6–23)
CALCIUM: 9.6 mg/dL (ref 8.4–10.5)
CO2: 27 mmol/L (ref 19–32)
Chloride: 104 mmol/L (ref 96–112)
Creatinine, Ser: 1.1 mg/dL (ref 0.50–1.35)
GFR calc Af Amer: 75 mL/min — ABNORMAL LOW (ref >90)
GFR, EST NON AFRICAN AMERICAN: 65 mL/min — AB (ref >90)
GLUCOSE: 86 mg/dL (ref 70–99)
POTASSIUM: 4.2 mmol/L (ref 3.5–5.1)
Sodium: 137 mmol/L (ref 135–145)
TOTAL PROTEIN: 7.4 g/dL (ref 6.0–8.3)

## 2014-03-07 LAB — POC OCCULT BLOOD, ED: Fecal Occult Bld: POSITIVE — AB

## 2014-03-07 LAB — FECAL OCCULT BLOOD, IMMUNOCHEMICAL
FECAL OCCULT BLOOD: NEGATIVE
FECAL OCCULT BLOOD: NEGATIVE

## 2014-03-07 LAB — SAMPLE TO BLOOD BANK

## 2014-03-07 LAB — BRAIN NATRIURETIC PEPTIDE: B NATRIURETIC PEPTIDE 5: 149 pg/mL — AB (ref 0.0–100.0)

## 2014-03-07 MED ORDER — SODIUM CHLORIDE 0.9 % IV BOLUS (SEPSIS)
500.0000 mL | Freq: Once | INTRAVENOUS | Status: AC
  Administered 2014-03-07: 500 mL via INTRAVENOUS

## 2014-03-07 NOTE — Discharge Instructions (Signed)
Your hemoglobin today was 9.9.     Potassium normal.      Rectal exam showed occult blood.    Reduce salt in your diet.   See Dr. Oneida Alar on Thursday.

## 2014-03-07 NOTE — ED Notes (Addendum)
PT c/o dizziness this morning and recheck at the MD office this am for blood work drawn and was told to come to ED for dehydration, anemia and hypotension. PT was recently put on lasix and potassium. PT denies any CP or SOB. Hgb reported form MD office of 8.8 and denies any blood noticed in BM. Daughter reports more edema to lower extremities.

## 2014-03-07 NOTE — ED Notes (Signed)
MD at bedside. 
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
## 2014-03-07 NOTE — ED Notes (Signed)
Peripheral edema to BLE, 1+, pitting

## 2014-03-07 NOTE — Telephone Encounter (Signed)
Letter mailed with appointment for [place] (03/10/14 @ 8:00 am)
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
## 2014-03-08 ENCOUNTER — Ambulatory Visit (INDEPENDENT_AMBULATORY_CARE_PROVIDER_SITE_OTHER): Payer: Medicare Other | Admitting: Gastroenterology

## 2014-03-08 ENCOUNTER — Ambulatory Visit (HOSPITAL_COMMUNITY): Payer: Medicare Other | Attending: Internal Medicine

## 2014-03-08 ENCOUNTER — Encounter: Payer: Self-pay | Admitting: Gastroenterology

## 2014-03-08 VITALS — BP 109/60 | HR 72 | Temp 97.0°F | Ht 70.0 in | Wt 197.0 lb

## 2014-03-08 DIAGNOSIS — R609 Edema, unspecified: Secondary | ICD-10-CM | POA: Diagnosis not present

## 2014-03-08 DIAGNOSIS — B182 Chronic viral hepatitis C: Secondary | ICD-10-CM

## 2014-03-08 DIAGNOSIS — R0600 Dyspnea, unspecified: Secondary | ICD-10-CM | POA: Diagnosis not present

## 2014-03-08 DIAGNOSIS — K7460 Unspecified cirrhosis of liver: Principal | ICD-10-CM

## 2014-03-08 NOTE — Progress Notes (Signed)
2D Echo completed. 03/08/2014

## 2014-03-08 NOTE — Patient Instructions (Signed)
COMPLETE LABS Mar 17, 2014.  COMPLETE ULTRASOUND IN FEB 2016.  CONTINUE HARVONI.  HOLD ASPIRIN FOR 3 MOS.  PLEASE CALL WITH QUESTIONS OR CONCERNS AND IF YOU HAVE RECTAL BLEEDING, BLACK TARRY STOOLS, OR NEED A BLOOD TRANSFUSION.  FOLLOW UP IN 3 MOS.

## 2014-03-08 NOTE — Progress Notes (Signed)
ON RECALL LIST  °
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
## 2014-03-08 NOTE — Assessment & Plan Note (Addendum)
Well compensated disease ASSOCIATED WITH NORMOCYTIC ANEMIA/HEME POS STOOLS ON ASA WITH PLT CT 67-72K. ANEMIA MOST LIKELY DUE TO CHRONIC DISEASE/GASTRITIS, LESS LIKELY SMALL BOWEL PROCESS. EGD/TCS UP TO DATE. NO SIGNS OR SYMPTOMS OF ACTIVE GI BLEED. LOWER EXTREMITY EDEMA MOST LIKELY DUE TO R SIDED HEART FAILURE AND CHRONIC RENAL INSUFFICIENCY LESS LIKELY CIRRHOSIS IN LIGHT OF NL ALBUMIN AND LIVER ENZYMES.   PT WILL CALL IF HE SEES BRBPR OR MELENA OR IF HE DEVELOPS A NEED FOR BLOOD TRANSFUSIONS. HOLD ASA. UP DATE LABS/IMAGING FEB 2016 CONTINUE HARVONI. LABS AND IMAGING REVIEWED FROM AUG 2015 TO PRESENT. RESULTS OF ECHO REVIEWED. FOLLOW UP IN 6 MOS FOR ROUTINE FOLLOW UP AND 3 MOS FOR HEP C TREATMENT.   FOLLOW UP WITH CARDIOLOGY RE: LOWER EXTREMITY EDEMA

## 2014-03-09 NOTE — Progress Notes (Signed)
cc'ed to pcp °

## 2014-03-10 ENCOUNTER — Ambulatory Visit (HOSPITAL_COMMUNITY)
Admission: RE | Admit: 2014-03-10 | Discharge: 2014-03-10 | Disposition: A | Discharge status: Home / Self Care | Payer: Medicare Other | Source: Ambulatory Visit | Attending: Gastroenterology | Admitting: Gastroenterology

## 2014-03-10 DIAGNOSIS — B182 Chronic viral hepatitis C: Secondary | ICD-10-CM

## 2014-03-10 DIAGNOSIS — K7460 Unspecified cirrhosis of liver: Secondary | ICD-10-CM

## 2014-03-17 ENCOUNTER — Telehealth: Payer: Self-pay | Admitting: Gastroenterology

## 2014-03-17 DIAGNOSIS — B182 Chronic viral hepatitis C: Secondary | ICD-10-CM | POA: Diagnosis not present

## 2014-03-17 DIAGNOSIS — B351 Tinea unguium: Secondary | ICD-10-CM | POA: Diagnosis not present

## 2014-03-17 DIAGNOSIS — K7460 Unspecified cirrhosis of liver: Principal | ICD-10-CM

## 2014-03-17 DIAGNOSIS — L851 Acquired keratosis [keratoderma] palmaris et plantaris: Secondary | ICD-10-CM | POA: Diagnosis not present

## 2014-03-17 DIAGNOSIS — E1342 Other specified diabetes mellitus with diabetic polyneuropathy: Secondary | ICD-10-CM | POA: Diagnosis not present

## 2014-03-17 LAB — CBC WITH DIFFERENTIAL/PLATELET
BASOS ABS: 0 10*3/uL (ref 0.0–0.1)
BASOS PCT: 1 % (ref 0–1)
EOS PCT: 4 % (ref 0–5)
Eosinophils Absolute: 0.2 10*3/uL (ref 0.0–0.7)
HCT: 31.6 % — ABNORMAL LOW (ref 39.0–52.0)
Hemoglobin: 10.1 g/dL — ABNORMAL LOW (ref 13.0–17.0)
Lymphocytes Relative: 31 % (ref 12–46)
Lymphs Abs: 1.3 10*3/uL (ref 0.7–4.0)
MCH: 27.2 pg (ref 26.0–34.0)
MCHC: 32 g/dL (ref 30.0–36.0)
MCV: 85.2 fL (ref 78.0–100.0)
MONO ABS: 0.3 10*3/uL (ref 0.1–1.0)
MONOS PCT: 8 % (ref 3–12)
MPV: 5.6 fL — ABNORMAL LOW (ref 8.6–12.4)
Neutro Abs: 2.4 10*3/uL (ref 1.7–7.7)
Neutrophils Relative %: 56 % (ref 43–77)
PLATELETS: 71 10*3/uL — AB (ref 150–400)
RBC: 3.71 MIL/uL — ABNORMAL LOW (ref 4.22–5.81)
RDW: 15.5 % (ref 11.5–15.5)
WBC: 4.3 10*3/uL (ref 4.0–10.5)

## 2014-03-17 LAB — PROTIME-INR
INR: 1.18 (ref <1.50)
Prothrombin Time: 15 seconds (ref 11.6–15.2)

## 2014-03-17 NOTE — Telephone Encounter (Signed)
PLEASE CALL PT. HIS LABS ARE IMPROVED SINCE JUL 2015. HIS U/S SHOWS A LESION UNDER THE RIGHT LOBE OF HIS LIVER. HE NEEDS AN MRI(W/ EOVIST) TO LOOK AT HIS LIVER(R HEPATIC LESION). IF HE IS CLAUSTROPHOBIC THEN HE WILL NEED TO GO TO THE OPEN MRI SCANNER IN [place]. HE SHOULD FOLLOW UP IN MAY 2016 W/ SLF E30 HEPATITIS C/HARVONI.

## 2014-03-18 LAB — AFP TUMOR MARKER: AFP TUMOR MARKER: 6.5 ng/mL — AB (ref <6.1)

## 2014-03-20 LAB — HEPATITIS C RNA QUANTITATIVE: HCV QUANT: NOT DETECTED [IU]/mL (ref <15)

## 2014-03-21 ENCOUNTER — Telehealth: Payer: Self-pay | Admitting: Gastroenterology

## 2014-03-21 NOTE — Telephone Encounter (Signed)
ON RECALL LIST FOR FU OV

## 2014-03-21 NOTE — Telephone Encounter (Signed)
Pt is aware and OK to schedule per his daughter, Joycelyn Schmid. Reminder, pt has a pacemaker.

## 2014-03-21 NOTE — Telephone Encounter (Signed)
NEEDS MRI SCHEDULED

## 2014-03-21 NOTE — Telephone Encounter (Signed)
LMOM to call.

## 2014-03-21 NOTE — Telephone Encounter (Signed)
PLEASE CALL PT. HIS HEPATITIS C LEVEL IS UNDETECTABLE. HE SHOULD CONTINUE HARVONI. BECAUSE HE HAS A PACEMAKER. I SPOKE WITH DR. CLARK. CHANGE MRI TO CT HEPATIC PROTOCOL.

## 2014-03-22 ENCOUNTER — Other Ambulatory Visit: Payer: Self-pay

## 2014-03-22 DIAGNOSIS — K769 Liver disease, unspecified: Secondary | ICD-10-CM

## 2014-03-22 NOTE — Telephone Encounter (Signed)
Order for CT was entered. CT is set up for 03/27/2014 @ 1000am at [facility]. Spoke with pts daughter Joycelyn Schmid and she is aware.  She is also aware that pt will have to pick up oral contrast prior to the 03/27/2014.

## 2014-03-23 NOTE — Telephone Encounter (Signed)
LMOM to call.

## 2014-03-23 NOTE — Telephone Encounter (Signed)
PLEASE CALL PT. HE SHOULD TAKE HIS OMEPRAZOLE AND HARVONI AT THE SAME TIME AND WAIT 30 MINS TO EAT OR DRINK.

## 2014-03-27 ENCOUNTER — Ambulatory Visit (HOSPITAL_COMMUNITY)
Admission: RE | Admit: 2014-03-27 | Discharge: 2014-03-27 | Disposition: A | Discharge status: Home / Self Care | Payer: Medicare Other | Source: Ambulatory Visit | Attending: Gastroenterology | Admitting: Gastroenterology

## 2014-03-27 DIAGNOSIS — Z950 Presence of cardiac pacemaker: Secondary | ICD-10-CM | POA: Insufficient documentation

## 2014-03-27 DIAGNOSIS — K7460 Unspecified cirrhosis of liver: Secondary | ICD-10-CM | POA: Insufficient documentation

## 2014-03-27 DIAGNOSIS — K769 Liver disease, unspecified: Secondary | ICD-10-CM

## 2014-03-27 DIAGNOSIS — Z8619 Personal history of other infectious and parasitic diseases: Secondary | ICD-10-CM | POA: Diagnosis not present

## 2014-03-27 DIAGNOSIS — I2510 Atherosclerotic heart disease of native coronary artery without angina pectoris: Secondary | ICD-10-CM | POA: Diagnosis not present

## 2014-03-27 DIAGNOSIS — R932 Abnormal findings on diagnostic imaging of liver and biliary tract: Secondary | ICD-10-CM | POA: Diagnosis not present

## 2014-03-27 MED ORDER — IOHEXOL 300 MG/ML  SOLN
100.0000 mL | Freq: Once | INTRAMUSCULAR | Status: AC | PRN
  Administered 2014-03-27: 100 mL via INTRAVENOUS

## 2014-03-28 NOTE — Telephone Encounter (Signed)
Pt's daughter Joycelyn Schmid called and was informed.

## 2014-03-28 NOTE — Telephone Encounter (Signed)
LMOM to call. Also mailed letter to call.

## 2014-03-29 NOTE — Telephone Encounter (Signed)
PLEASE CALL PT. HIS CT SHOWS CIRRHOSIS BUT NO TUMOR IN HIS LIVER. REPEAT CT ABD IN 6 MOS.

## 2014-03-30 NOTE — Telephone Encounter (Signed)
PT's daughter, Joycelyn Schmid, was informed.

## 2014-04-03 NOTE — Telephone Encounter (Signed)
REMINDER IN EPIC °

## 2014-04-04 DIAGNOSIS — Z961 Presence of intraocular lens: Secondary | ICD-10-CM | POA: Diagnosis not present

## 2014-04-04 DIAGNOSIS — Z794 Long term (current) use of insulin: Secondary | ICD-10-CM | POA: Diagnosis not present

## 2014-04-04 DIAGNOSIS — E119 Type 2 diabetes mellitus without complications: Secondary | ICD-10-CM | POA: Diagnosis not present

## 2014-04-06 ENCOUNTER — Ambulatory Visit (INDEPENDENT_AMBULATORY_CARE_PROVIDER_SITE_OTHER): Payer: Medicare Other | Admitting: *Deleted

## 2014-04-06 DIAGNOSIS — I499 Cardiac arrhythmia, unspecified: Secondary | ICD-10-CM

## 2014-04-06 NOTE — Progress Notes (Signed)
Remote pacemaker transmission.   

## 2014-04-14 ENCOUNTER — Other Ambulatory Visit: Payer: Self-pay | Admitting: Family Medicine

## 2014-04-14 LAB — MDC_IDC_ENUM_SESS_TYPE_REMOTE
Battery Impedance: 521 Ohm
Battery Remaining Longevity: 88 mo
Battery Voltage: 2.79 V
Brady Statistic AP VS Percent: 85 %
Brady Statistic AS VP Percent: 0 %
Date Time Interrogation Session: 20160303153339
Lead Channel Impedance Value: 445 Ohm
Lead Channel Pacing Threshold Amplitude: 0.5 V
Lead Channel Pacing Threshold Amplitude: 1.875 V
Lead Channel Pacing Threshold Pulse Width: 0.4 ms
Lead Channel Sensing Intrinsic Amplitude: 5.6 mV
Lead Channel Setting Pacing Amplitude: 2 V
Lead Channel Setting Pacing Amplitude: 3.75 V
Lead Channel Setting Pacing Pulse Width: 0.4 ms
MDC IDC MSMT LEADCHNL RV IMPEDANCE VALUE: 569 Ohm
MDC IDC MSMT LEADCHNL RV PACING THRESHOLD PULSEWIDTH: 0.4 ms
MDC IDC SET LEADCHNL RV SENSING SENSITIVITY: 2 mV
MDC IDC STAT BRADY AP VP PERCENT: 0 %
MDC IDC STAT BRADY AS VS PERCENT: 15 %

## 2014-04-14 NOTE — Telephone Encounter (Signed)
Medication refilled per protocol. 

## 2014-04-20 ENCOUNTER — Encounter: Payer: Self-pay | Admitting: Cardiology

## 2014-04-26 ENCOUNTER — Encounter: Payer: Self-pay | Admitting: Internal Medicine

## 2014-05-15 ENCOUNTER — Other Ambulatory Visit: Payer: Self-pay | Admitting: Family Medicine

## 2014-05-26 DIAGNOSIS — B351 Tinea unguium: Secondary | ICD-10-CM | POA: Diagnosis not present

## 2014-05-26 DIAGNOSIS — L851 Acquired keratosis [keratoderma] palmaris et plantaris: Secondary | ICD-10-CM | POA: Diagnosis not present

## 2014-05-26 DIAGNOSIS — E1342 Other specified diabetes mellitus with diabetic polyneuropathy: Secondary | ICD-10-CM | POA: Diagnosis not present

## 2014-06-01 ENCOUNTER — Encounter: Payer: Self-pay | Admitting: Gastroenterology

## 2014-06-22 ENCOUNTER — Ambulatory Visit (INDEPENDENT_AMBULATORY_CARE_PROVIDER_SITE_OTHER): Payer: Medicare Other | Admitting: Gastroenterology

## 2014-06-22 ENCOUNTER — Encounter: Payer: Self-pay | Admitting: Gastroenterology

## 2014-06-22 VITALS — BP 129/68 | HR 67 | Temp 97.6°F | Ht 68.0 in | Wt 200.0 lb

## 2014-06-22 DIAGNOSIS — B182 Chronic viral hepatitis C: Secondary | ICD-10-CM

## 2014-06-22 DIAGNOSIS — K7460 Unspecified cirrhosis of liver: Principal | ICD-10-CM

## 2014-06-22 NOTE — Patient Instructions (Signed)
YOUR NEXT AND LAST HARVONI SHIPMENT WILL BE MAY 24.  YOU NEED YOUR BLOOD CHECKED IN SEP 2016 TO MAKE SURE THE HEPATITIS C IS STILL NOT DETECTABLE IN YOUR BLOOD.   YOU NEED YOUR BLOOD DRAWN  AND AN ULTRASOUND 1 WEEK PRIOR TO YOUR NEXT VISIT.
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
## 2014-06-22 NOTE — Progress Notes (Signed)
CC'ED TO PCP 

## 2014-06-22 NOTE — Assessment & Plan Note (Addendum)
WELL COMPENSATED DISEASE. TOLERATING HARVONI. LE EDEMA IMPROVED.  CALLED PHARMACY LAST SHIPMENT FOR HARVONI MAY 24.PT WILL COMPLETE HARVONI JUN 2016. CHECK HCV VL SEP 2016.  LABS: (CMP, CBC, PT/INR, AFP)/ RUQ /U/S 1 WEEK PRIOR TO NEXT OPV IN DEC 2016.

## 2014-06-23 NOTE — Progress Notes (Signed)
Reminder made in [place]

## 2014-06-28 ENCOUNTER — Encounter: Payer: Self-pay | Admitting: Gastroenterology

## 2014-07-10 ENCOUNTER — Ambulatory Visit (INDEPENDENT_AMBULATORY_CARE_PROVIDER_SITE_OTHER): Payer: Medicare Other | Admitting: *Deleted

## 2014-07-10 ENCOUNTER — Telehealth: Payer: Self-pay | Admitting: Cardiology

## 2014-07-10 DIAGNOSIS — I499 Cardiac arrhythmia, unspecified: Secondary | ICD-10-CM

## 2014-07-10 NOTE — Telephone Encounter (Signed)
Confirmed remote transmission w/ pt wife.   

## 2014-07-11 DIAGNOSIS — I499 Cardiac arrhythmia, unspecified: Secondary | ICD-10-CM | POA: Diagnosis not present

## 2014-07-11 NOTE — Progress Notes (Signed)
Remote pacemaker transmission.   

## 2014-07-16 LAB — CUP PACEART REMOTE DEVICE CHECK
Battery Impedance: 597 Ohm
Brady Statistic AP VP Percent: 0 %
Brady Statistic AS VP Percent: 0 %
Brady Statistic AS VS Percent: 17 %
Lead Channel Impedance Value: 427 Ohm
Lead Channel Pacing Threshold Pulse Width: 0.4 ms
Lead Channel Setting Pacing Amplitude: 2 V
Lead Channel Setting Pacing Pulse Width: 0.52 ms
Lead Channel Setting Sensing Sensitivity: 2 mV
MDC IDC MSMT BATTERY REMAINING LONGEVITY: 83 mo
MDC IDC MSMT BATTERY VOLTAGE: 2.78 V
MDC IDC MSMT LEADCHNL RA PACING THRESHOLD AMPLITUDE: 0.5 V
MDC IDC MSMT LEADCHNL RA PACING THRESHOLD PULSEWIDTH: 0.4 ms
MDC IDC MSMT LEADCHNL RV IMPEDANCE VALUE: 556 Ohm
MDC IDC MSMT LEADCHNL RV PACING THRESHOLD AMPLITUDE: 2 V
MDC IDC MSMT LEADCHNL RV SENSING INTR AMPL: 5.6 mV
MDC IDC SESS DTM: 20160607190702
MDC IDC SET LEADCHNL RV PACING AMPLITUDE: 4 V
MDC IDC STAT BRADY AP VS PERCENT: 83 %

## 2014-07-24 ENCOUNTER — Encounter: Payer: Self-pay | Admitting: Cardiology

## 2014-07-26 ENCOUNTER — Encounter: Payer: Self-pay | Admitting: Internal Medicine

## 2014-07-31 ENCOUNTER — Other Ambulatory Visit: Payer: Self-pay | Admitting: Family Medicine

## 2014-08-09 ENCOUNTER — Telehealth: Payer: Self-pay | Admitting: Gastroenterology

## 2014-08-09 NOTE — Telephone Encounter (Signed)
open in error

## 2014-08-09 NOTE — Telephone Encounter (Signed)
Patient is on the August recall list to have CT ABD in 6 months

## 2014-08-15 ENCOUNTER — Other Ambulatory Visit: Payer: Self-pay | Admitting: Family Medicine

## 2014-08-15 NOTE — Telephone Encounter (Signed)
Mailed letter °

## 2014-08-15 NOTE — Telephone Encounter (Signed)
Refill appropriate and filled per protocol. 

## 2014-08-23 ENCOUNTER — Other Ambulatory Visit (HOSPITAL_COMMUNITY): Payer: Medicare Other

## 2014-08-28 ENCOUNTER — Ambulatory Visit (HOSPITAL_COMMUNITY)
Admission: RE | Admit: 2014-08-28 | Discharge: 2014-08-28 | Disposition: A | Discharge status: Home / Self Care | Payer: Medicare Other | Source: Ambulatory Visit | Attending: Gastroenterology | Admitting: Gastroenterology

## 2014-08-28 ENCOUNTER — Encounter (HOSPITAL_COMMUNITY): Payer: Self-pay

## 2014-08-28 DIAGNOSIS — R161 Splenomegaly, not elsewhere classified: Secondary | ICD-10-CM | POA: Diagnosis not present

## 2014-08-28 DIAGNOSIS — B1920 Unspecified viral hepatitis C without hepatic coma: Secondary | ICD-10-CM | POA: Insufficient documentation

## 2014-08-28 DIAGNOSIS — K7689 Other specified diseases of liver: Secondary | ICD-10-CM | POA: Insufficient documentation

## 2014-08-28 DIAGNOSIS — K769 Liver disease, unspecified: Secondary | ICD-10-CM

## 2014-08-28 DIAGNOSIS — K7460 Unspecified cirrhosis of liver: Secondary | ICD-10-CM | POA: Diagnosis not present

## 2014-08-28 LAB — POCT I-STAT CREATININE: CREATININE: 0.9 mg/dL (ref 0.61–1.24)

## 2014-08-28 MED ORDER — IOHEXOL 300 MG/ML  SOLN
100.0000 mL | Freq: Once | INTRAMUSCULAR | Status: AC | PRN
  Administered 2014-08-28: 100 mL via INTRAVENOUS

## 2014-08-29 ENCOUNTER — Telehealth: Payer: Self-pay | Admitting: Gastroenterology

## 2014-08-29 DIAGNOSIS — R160 Hepatomegaly, not elsewhere classified: Secondary | ICD-10-CM

## 2014-08-29 DIAGNOSIS — B182 Chronic viral hepatitis C: Secondary | ICD-10-CM

## 2014-08-29 DIAGNOSIS — K7460 Unspecified cirrhosis of liver: Secondary | ICD-10-CM

## 2014-08-29 NOTE — Telephone Encounter (Signed)
REVIEWED FILMS WITH DR. Janeece Fitting. PT NEEDS LIVER BIOPSY TO EVALUATE . NOT ABLE TO HAVE MRI DUE TO PACER. CALLED PT'S DAUGHTER TO DISCUSS RESULTS, LIVER BIOPSY, AND BENEFITS V. RISKS OF LIVER BIOPSY, AS WELL AS SUBSEQUENT MANAGEMENT. EXPLAINED RESULTS AND  NEEDS LIVER BIOPSY IN [place] VIA IR AND REPEAT AFP. CALLED PT TO DISCUSS x 2. GOT VM. PT MAY COME BY ENDOSCOPY SUITE O DISCUSS OR CAN CALL WITH QUESTIONS/TO DISCUSS.

## 2014-08-29 NOTE — Assessment & Plan Note (Signed)
2 CM LIVER MASS.

## 2014-08-30 ENCOUNTER — Other Ambulatory Visit: Payer: Self-pay

## 2014-08-30 DIAGNOSIS — K7460 Unspecified cirrhosis of liver: Secondary | ICD-10-CM

## 2014-08-30 DIAGNOSIS — R160 Hepatomegaly, not elsewhere classified: Secondary | ICD-10-CM

## 2014-08-30 NOTE — Telephone Encounter (Signed)
Spoke with patients daughter([name] [name]).  She states that she spoke with Dr. Oneida Alar yesterday and is aware of her recommendations.  Informed daughter that labs were faxed to [facility] and the IR at [facility] will be contacting to schedule Liver Bx.

## 2014-08-31 ENCOUNTER — Other Ambulatory Visit: Payer: Self-pay

## 2014-08-31 DIAGNOSIS — R160 Hepatomegaly, not elsewhere classified: Secondary | ICD-10-CM

## 2014-09-04 NOTE — Telephone Encounter (Signed)
Spoke with daughter and she states that they called him this morning and confirmed his appt.

## 2014-09-04 NOTE — Telephone Encounter (Signed)
SPOKE WITH DR. Earleen Newport. PT APPROVED FOR CT BIOPSY. APPT AUG 8.   PLEASE CALL PT AND MAKE SURE HE HAS RECEIVED HIS APPT INFORMATION.

## 2014-09-05 ENCOUNTER — Telehealth: Payer: Self-pay | Admitting: Gastroenterology

## 2014-09-05 ENCOUNTER — Other Ambulatory Visit: Payer: Self-pay

## 2014-09-05 DIAGNOSIS — K7460 Unspecified cirrhosis of liver: Secondary | ICD-10-CM

## 2014-09-05 NOTE — Telephone Encounter (Signed)
PATIENT ON RECALL FOR LABS IN SEPT

## 2014-09-05 NOTE — Telephone Encounter (Signed)
Lab order on file for 10/10/2014.

## 2014-09-06 DIAGNOSIS — B182 Chronic viral hepatitis C: Secondary | ICD-10-CM | POA: Diagnosis not present

## 2014-09-06 DIAGNOSIS — K7460 Unspecified cirrhosis of liver: Secondary | ICD-10-CM | POA: Diagnosis not present

## 2014-09-07 LAB — HEPATITIS C RNA QUANTITATIVE: HCV QUANT: NOT DETECTED [IU]/mL (ref <15)

## 2014-09-07 LAB — AFP TUMOR MARKER: AFP TUMOR MARKER: 5.2 ng/mL (ref <6.1)

## 2014-09-08 ENCOUNTER — Other Ambulatory Visit: Payer: Self-pay | Admitting: Radiology

## 2014-09-08 NOTE — Telephone Encounter (Addendum)
PLEASE CALL PT. HIS HEP C TEST & TUMOR MARKER ARE NEGATIVE.

## 2014-09-08 NOTE — Telephone Encounter (Signed)
Daughter is aware of results.

## 2014-09-09 ENCOUNTER — Other Ambulatory Visit: Payer: Self-pay | Admitting: Radiology

## 2014-09-11 ENCOUNTER — Encounter (HOSPITAL_COMMUNITY): Payer: Self-pay

## 2014-09-11 ENCOUNTER — Telehealth: Payer: Self-pay | Admitting: Gastroenterology

## 2014-09-11 ENCOUNTER — Telehealth: Payer: Self-pay

## 2014-09-11 ENCOUNTER — Ambulatory Visit (HOSPITAL_COMMUNITY)
Admission: RE | Admit: 2014-09-11 | Discharge: 2014-09-11 | Disposition: A | Discharge status: Home / Self Care | Payer: Medicare Other | Source: Ambulatory Visit | Attending: Gastroenterology | Admitting: Gastroenterology

## 2014-09-11 ENCOUNTER — Telehealth: Payer: Self-pay | Admitting: Internal Medicine

## 2014-09-11 ENCOUNTER — Other Ambulatory Visit: Payer: Self-pay

## 2014-09-11 DIAGNOSIS — K769 Liver disease, unspecified: Secondary | ICD-10-CM | POA: Insufficient documentation

## 2014-09-11 DIAGNOSIS — I10 Essential (primary) hypertension: Secondary | ICD-10-CM | POA: Insufficient documentation

## 2014-09-11 DIAGNOSIS — R160 Hepatomegaly, not elsewhere classified: Secondary | ICD-10-CM

## 2014-09-11 DIAGNOSIS — Z955 Presence of coronary angioplasty implant and graft: Secondary | ICD-10-CM | POA: Insufficient documentation

## 2014-09-11 DIAGNOSIS — K7460 Unspecified cirrhosis of liver: Secondary | ICD-10-CM | POA: Insufficient documentation

## 2014-09-11 DIAGNOSIS — R161 Splenomegaly, not elsewhere classified: Secondary | ICD-10-CM | POA: Diagnosis not present

## 2014-09-11 DIAGNOSIS — I252 Old myocardial infarction: Secondary | ICD-10-CM | POA: Diagnosis not present

## 2014-09-11 DIAGNOSIS — E785 Hyperlipidemia, unspecified: Secondary | ICD-10-CM | POA: Diagnosis not present

## 2014-09-11 DIAGNOSIS — Z87891 Personal history of nicotine dependence: Secondary | ICD-10-CM | POA: Diagnosis not present

## 2014-09-11 DIAGNOSIS — H9193 Unspecified hearing loss, bilateral: Secondary | ICD-10-CM | POA: Insufficient documentation

## 2014-09-11 DIAGNOSIS — B1920 Unspecified viral hepatitis C without hepatic coma: Secondary | ICD-10-CM | POA: Insufficient documentation

## 2014-09-11 DIAGNOSIS — E119 Type 2 diabetes mellitus without complications: Secondary | ICD-10-CM | POA: Insufficient documentation

## 2014-09-11 LAB — CBC
HEMATOCRIT: 34.1 % — AB (ref 39.0–52.0)
HEMOGLOBIN: 12 g/dL — AB (ref 13.0–17.0)
MCH: 31.2 pg (ref 26.0–34.0)
MCHC: 35.2 g/dL (ref 30.0–36.0)
MCV: 88.6 fL (ref 78.0–100.0)
PLATELETS: 62 10*3/uL — AB (ref 150–400)
RBC: 3.85 MIL/uL — AB (ref 4.22–5.81)
RDW: 14.5 % (ref 11.5–15.5)
WBC: 4 10*3/uL (ref 4.0–10.5)

## 2014-09-11 LAB — PROTIME-INR
INR: 1.15 (ref 0.00–1.49)
Prothrombin Time: 14.9 seconds (ref 11.6–15.2)

## 2014-09-11 LAB — APTT: APTT: 36 s (ref 24–37)

## 2014-09-11 LAB — GLUCOSE, CAPILLARY: Glucose-Capillary: 122 mg/dL — ABNORMAL HIGH (ref 65–99)

## 2014-09-11 MED ORDER — GELATIN ABSORBABLE 12-7 MM EX MISC
CUTANEOUS | Status: AC
  Filled 2014-09-11: qty 2

## 2014-09-11 MED ORDER — LIDOCAINE HCL 1 % IJ SOLN
INTRAMUSCULAR | Status: AC
  Filled 2014-09-11: qty 20

## 2014-09-11 MED ORDER — SODIUM CHLORIDE 0.9 % IV SOLN
INTRAVENOUS | Status: DC
  Administered 2014-09-11: 1000 mL via INTRAVENOUS

## 2014-09-11 NOTE — Telephone Encounter (Signed)
Spoke to Dr. Barbie Banner today about this patient. CT reviewed by him and 2 other radiologists today. CT already diagnostic for hepatocellular carcinoma. Liver biopsy felt not to be needed even though alpha-fetoprotein not very impressive. I noted patient's daughter called in today as well. I called her and I spoke to [place] at length about the scenario. This lesion appears to be resectable. I don't see the patient being a liver transplant candidate.  I have recommended the patient be referred to Dr. Gertie Baron at [facility] for consideration of resection versus other treatment modalities. She wishes this appointment to be made. I told her that I would forward these communications on to Dr. Oneida Alar who will return tomorrow and may have additional recommendations.. Patient's daughter asked me to go ahead and make that appointment to see Dr. Sallee Lange.

## 2014-09-11 NOTE — Telephone Encounter (Signed)
Referral made to Dr. Crisoforo Oxford at [facility]. Patient has appt. On 09/15/2014 @ 2:30pm. Patients daughter Joycelyn Schmid is aware of appt with Dr. Crisoforo Oxford.   Faxed over records

## 2014-09-11 NOTE — Telephone Encounter (Signed)
I returned Dr. Barbie Banner' phone call today. I was told by his receptionist he was tied up in a procedure. I have requested a return phone call.

## 2014-09-11 NOTE — Telephone Encounter (Signed)
T/C from Dr. Maryclare Bean Radiologist at [facility]. In Dr Oneida Alar absence took the phone call and Dr. Barbie Banner said the biopsy is not needed to confirm the Hepatocellular carcinoma note on the CT. Also, pt's platelets were low.  He would like for [place] to make sure that we call pt to schedule appt here to follow up. Also, he would like a phone call from Dr. Gala Romney today in Dr. Nona Dell absence to discuss. He can be reached at [phone].

## 2014-09-11 NOTE — Telephone Encounter (Signed)
Joycelyn Schmid (daughter of patient) called this afternoon to say that the patient went to [facility] this morning to have a liver biopsy, but they did not have it done because his platelet count was 62 and that was too low. He was told to call us to see what the next step would be or if a referral was needed since he does have a cancerous mass. Daughter is anxious to know what to do. Please advise and call her at [phone]

## 2014-09-11 NOTE — Telephone Encounter (Signed)
noted 

## 2014-09-11 NOTE — Telephone Encounter (Signed)
See Dr. Roseanne Kaufman separate phone note of 09/11/2014 at 1:22 pm.

## 2014-09-11 NOTE — Telephone Encounter (Signed)
Pt's daughter said she was under the impression that the pt will be referred to a oncologist. I told her I am waiting to be told what to do. Dr. Gala Romney has returned  Dr. Jonelle Sports call and left message for a return call to discuss in Dr. . Nona Dell absence.  I told her we will be getting in touch with them when they have that discussion.

## 2014-09-11 NOTE — Telephone Encounter (Signed)
Routing to [place] in [place] absence.

## 2014-09-11 NOTE — Telephone Encounter (Signed)
Routing to Dr. Oneida Alar for [place].
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
## 2014-09-11 NOTE — Telephone Encounter (Signed)
Noted  

## 2014-09-11 NOTE — Sedation Documentation (Signed)
MD at bedside.d/w pt and dtr plan to cancel biopsy and f/u with Dr Oneida Alar

## 2014-09-11 NOTE — Telephone Encounter (Signed)
See Dr. Roseanne Kaufman separate phone note of 09/11/2014 at 1:22 PM.

## 2014-09-11 NOTE — Progress Notes (Signed)
Patient ID: [name] [name], male   DOB: 1940-06-02, 74 y.o.   MRN: [id number] I have reviewed the imaging, and interviewed the patient. He has (HepC) cirrhosis with a liver mass here for biopsy. His AFP was negative. CT in February was also negative but the recent CT scan (three phase) shows a new 2.1 cm lesion in lobe 6 of the liver. There is arterial enhancement, and washout on delayed phase imaging. This meets diagnostic criteria for HCCA. Patient is to follow up with Dr. Oneida Alar.

## 2014-09-12 NOTE — Telephone Encounter (Signed)
REVIEWED. AGREE. NO ADDITIONAL RECOMMENDATIONS. 

## 2014-09-15 DIAGNOSIS — C220 Liver cell carcinoma: Secondary | ICD-10-CM | POA: Diagnosis not present

## 2014-09-15 DIAGNOSIS — Z87891 Personal history of nicotine dependence: Secondary | ICD-10-CM | POA: Diagnosis not present

## 2014-09-15 DIAGNOSIS — K7460 Unspecified cirrhosis of liver: Secondary | ICD-10-CM | POA: Diagnosis not present

## 2014-09-15 DIAGNOSIS — E119 Type 2 diabetes mellitus without complications: Secondary | ICD-10-CM | POA: Diagnosis not present

## 2014-09-15 DIAGNOSIS — Z79899 Other long term (current) drug therapy: Secondary | ICD-10-CM | POA: Diagnosis not present

## 2014-09-15 DIAGNOSIS — B1920 Unspecified viral hepatitis C without hepatic coma: Secondary | ICD-10-CM | POA: Diagnosis not present

## 2014-09-15 DIAGNOSIS — K219 Gastro-esophageal reflux disease without esophagitis: Secondary | ICD-10-CM | POA: Diagnosis not present

## 2014-09-15 DIAGNOSIS — I10 Essential (primary) hypertension: Secondary | ICD-10-CM | POA: Diagnosis not present

## 2014-09-19 DIAGNOSIS — I2510 Atherosclerotic heart disease of native coronary artery without angina pectoris: Secondary | ICD-10-CM | POA: Diagnosis not present

## 2014-09-19 DIAGNOSIS — R918 Other nonspecific abnormal finding of lung field: Secondary | ICD-10-CM | POA: Diagnosis not present

## 2014-09-19 DIAGNOSIS — K7460 Unspecified cirrhosis of liver: Secondary | ICD-10-CM | POA: Diagnosis not present

## 2014-09-19 DIAGNOSIS — K769 Liver disease, unspecified: Secondary | ICD-10-CM | POA: Diagnosis not present

## 2014-09-19 DIAGNOSIS — Z950 Presence of cardiac pacemaker: Secondary | ICD-10-CM | POA: Diagnosis not present

## 2014-09-19 DIAGNOSIS — B1920 Unspecified viral hepatitis C without hepatic coma: Secondary | ICD-10-CM | POA: Diagnosis not present

## 2014-09-19 DIAGNOSIS — C220 Liver cell carcinoma: Secondary | ICD-10-CM | POA: Diagnosis not present

## 2014-10-04 ENCOUNTER — Telehealth: Payer: Self-pay | Admitting: Internal Medicine

## 2014-10-04 NOTE — Telephone Encounter (Signed)
LMOM for [place].  We have not received faxed form from [facility] yet.

## 2014-10-04 NOTE — Telephone Encounter (Signed)
New message    [facility] calling patient has cancer-  Wants to know what type of pace make he has .     Office will be faxing over Form need to be filled out by physician

## 2014-10-05 NOTE — Telephone Encounter (Signed)
Spoke with Colletta Maryland, NP regarding clearance form that faxed back to [facility] on 10/04/14.  Colletta Maryland was appreciative and denied any additional questions or concerns.

## 2014-10-11 ENCOUNTER — Telehealth: Payer: Self-pay | Admitting: Cardiology

## 2014-10-11 ENCOUNTER — Encounter: Payer: Medicare Other | Admitting: *Deleted

## 2014-10-11 NOTE — Telephone Encounter (Signed)
LMOVM reminding pt to send remote transmission.   

## 2014-10-12 ENCOUNTER — Encounter: Payer: Self-pay | Admitting: Cardiology

## 2014-10-23 ENCOUNTER — Ambulatory Visit (INDEPENDENT_AMBULATORY_CARE_PROVIDER_SITE_OTHER): Payer: Medicare Other | Admitting: *Deleted

## 2014-10-23 ENCOUNTER — Encounter: Payer: Self-pay | Admitting: Internal Medicine

## 2014-10-23 DIAGNOSIS — I499 Cardiac arrhythmia, unspecified: Secondary | ICD-10-CM

## 2014-10-24 DIAGNOSIS — C220 Liver cell carcinoma: Secondary | ICD-10-CM | POA: Diagnosis not present

## 2014-10-24 DIAGNOSIS — K7689 Other specified diseases of liver: Secondary | ICD-10-CM | POA: Diagnosis not present

## 2014-10-24 NOTE — Progress Notes (Signed)
Remote pacemaker transmission.   

## 2014-10-30 LAB — CUP PACEART REMOTE DEVICE CHECK
Battery Impedance: 674 Ohm
Battery Remaining Longevity: 75 mo
Brady Statistic AS VS Percent: 19 %
Lead Channel Pacing Threshold Amplitude: 2.25 V
Lead Channel Sensing Intrinsic Amplitude: 5.6 mV
Lead Channel Setting Pacing Amplitude: 2 V
Lead Channel Setting Sensing Sensitivity: 2 mV
MDC IDC MSMT BATTERY VOLTAGE: 2.8 V
MDC IDC MSMT LEADCHNL RA IMPEDANCE VALUE: 416 Ohm
MDC IDC MSMT LEADCHNL RA PACING THRESHOLD AMPLITUDE: 0.5 V
MDC IDC MSMT LEADCHNL RA PACING THRESHOLD PULSEWIDTH: 0.4 ms
MDC IDC MSMT LEADCHNL RV IMPEDANCE VALUE: 555 Ohm
MDC IDC MSMT LEADCHNL RV PACING THRESHOLD PULSEWIDTH: 0.4 ms
MDC IDC SESS DTM: 20160919191202
MDC IDC SET LEADCHNL RV PACING AMPLITUDE: 4.5 V
MDC IDC SET LEADCHNL RV PACING PULSEWIDTH: 0.76 ms
MDC IDC STAT BRADY AP VP PERCENT: 0 %
MDC IDC STAT BRADY AP VS PERCENT: 81 %
MDC IDC STAT BRADY AS VP PERCENT: 0 %

## 2014-11-04 DIAGNOSIS — C220 Liver cell carcinoma: Secondary | ICD-10-CM | POA: Insufficient documentation

## 2014-11-04 HISTORY — DX: Liver cell carcinoma: C22.0

## 2014-11-09 DIAGNOSIS — C220 Liver cell carcinoma: Secondary | ICD-10-CM | POA: Diagnosis not present

## 2014-11-10 ENCOUNTER — Encounter: Payer: Self-pay | Admitting: Cardiology

## 2014-11-19 ENCOUNTER — Other Ambulatory Visit: Payer: Self-pay | Admitting: Family Medicine

## 2014-11-19 ENCOUNTER — Other Ambulatory Visit: Payer: Self-pay | Admitting: Internal Medicine

## 2014-11-23 DIAGNOSIS — I459 Conduction disorder, unspecified: Secondary | ICD-10-CM | POA: Diagnosis not present

## 2014-11-23 DIAGNOSIS — E119 Type 2 diabetes mellitus without complications: Secondary | ICD-10-CM | POA: Diagnosis not present

## 2014-11-23 DIAGNOSIS — C220 Liver cell carcinoma: Secondary | ICD-10-CM | POA: Diagnosis not present

## 2014-11-23 DIAGNOSIS — Z955 Presence of coronary angioplasty implant and graft: Secondary | ICD-10-CM | POA: Diagnosis not present

## 2014-11-23 DIAGNOSIS — Z23 Encounter for immunization: Secondary | ICD-10-CM | POA: Diagnosis not present

## 2014-11-23 DIAGNOSIS — Z950 Presence of cardiac pacemaker: Secondary | ICD-10-CM | POA: Diagnosis not present

## 2014-11-23 DIAGNOSIS — Z8619 Personal history of other infectious and parasitic diseases: Secondary | ICD-10-CM | POA: Diagnosis not present

## 2014-11-23 DIAGNOSIS — K219 Gastro-esophageal reflux disease without esophagitis: Secondary | ICD-10-CM | POA: Diagnosis not present

## 2014-11-23 DIAGNOSIS — E785 Hyperlipidemia, unspecified: Secondary | ICD-10-CM | POA: Diagnosis not present

## 2014-11-23 DIAGNOSIS — I10 Essential (primary) hypertension: Secondary | ICD-10-CM | POA: Diagnosis not present

## 2014-11-23 DIAGNOSIS — Z7982 Long term (current) use of aspirin: Secondary | ICD-10-CM | POA: Diagnosis not present

## 2014-11-23 DIAGNOSIS — Z87891 Personal history of nicotine dependence: Secondary | ICD-10-CM | POA: Diagnosis not present

## 2014-11-23 DIAGNOSIS — J449 Chronic obstructive pulmonary disease, unspecified: Secondary | ICD-10-CM | POA: Diagnosis not present

## 2014-11-23 DIAGNOSIS — Z01810 Encounter for preprocedural cardiovascular examination: Secondary | ICD-10-CM | POA: Diagnosis not present

## 2014-11-23 DIAGNOSIS — I2510 Atherosclerotic heart disease of native coronary artery without angina pectoris: Secondary | ICD-10-CM | POA: Diagnosis not present

## 2014-11-27 DIAGNOSIS — Z01810 Encounter for preprocedural cardiovascular examination: Secondary | ICD-10-CM | POA: Diagnosis not present

## 2014-11-27 DIAGNOSIS — E785 Hyperlipidemia, unspecified: Secondary | ICD-10-CM | POA: Diagnosis not present

## 2014-11-27 DIAGNOSIS — I2510 Atherosclerotic heart disease of native coronary artery without angina pectoris: Secondary | ICD-10-CM | POA: Diagnosis not present

## 2014-11-27 DIAGNOSIS — Z950 Presence of cardiac pacemaker: Secondary | ICD-10-CM | POA: Diagnosis not present

## 2014-11-27 DIAGNOSIS — K219 Gastro-esophageal reflux disease without esophagitis: Secondary | ICD-10-CM | POA: Diagnosis not present

## 2014-11-27 DIAGNOSIS — I10 Essential (primary) hypertension: Secondary | ICD-10-CM | POA: Diagnosis not present

## 2014-11-27 DIAGNOSIS — E119 Type 2 diabetes mellitus without complications: Secondary | ICD-10-CM | POA: Diagnosis not present

## 2014-11-27 DIAGNOSIS — C220 Liver cell carcinoma: Secondary | ICD-10-CM | POA: Diagnosis not present

## 2014-11-28 DIAGNOSIS — C220 Liver cell carcinoma: Secondary | ICD-10-CM | POA: Diagnosis not present

## 2014-11-28 DIAGNOSIS — E785 Hyperlipidemia, unspecified: Secondary | ICD-10-CM | POA: Diagnosis not present

## 2014-11-28 DIAGNOSIS — K219 Gastro-esophageal reflux disease without esophagitis: Secondary | ICD-10-CM | POA: Diagnosis not present

## 2014-11-28 DIAGNOSIS — E119 Type 2 diabetes mellitus without complications: Secondary | ICD-10-CM | POA: Diagnosis not present

## 2014-11-28 DIAGNOSIS — I2510 Atherosclerotic heart disease of native coronary artery without angina pectoris: Secondary | ICD-10-CM | POA: Diagnosis not present

## 2014-11-28 DIAGNOSIS — I10 Essential (primary) hypertension: Secondary | ICD-10-CM | POA: Diagnosis not present

## 2014-12-11 ENCOUNTER — Ambulatory Visit (INDEPENDENT_AMBULATORY_CARE_PROVIDER_SITE_OTHER): Payer: Medicare Other | Admitting: Family Medicine

## 2014-12-11 ENCOUNTER — Encounter: Payer: Self-pay | Admitting: Family Medicine

## 2014-12-11 VITALS — BP 130/72 | HR 68 | Temp 97.7°F | Resp 18 | Wt 196.0 lb

## 2014-12-11 DIAGNOSIS — B356 Tinea cruris: Secondary | ICD-10-CM

## 2014-12-11 DIAGNOSIS — N390 Urinary tract infection, site not specified: Secondary | ICD-10-CM

## 2014-12-11 LAB — URINALYSIS, MICROSCOPIC ONLY
CRYSTALS: NONE SEEN [HPF]
Casts: NONE SEEN [LPF]
YEAST: NONE SEEN [HPF]

## 2014-12-11 LAB — URINALYSIS, ROUTINE W REFLEX MICROSCOPIC
Bilirubin Urine: NEGATIVE
Ketones, ur: NEGATIVE
Nitrite: POSITIVE — AB
PH: 6 (ref 5.0–8.0)
Protein, ur: NEGATIVE
SPECIFIC GRAVITY, URINE: 1.02 (ref 1.001–1.035)

## 2014-12-11 MED ORDER — CIPROFLOXACIN HCL 500 MG PO TABS: 500.0000 mg | ORAL_TABLET | Freq: Two times a day (BID) | ORAL | Status: DC

## 2014-12-11 MED ORDER — CLOTRIMAZOLE-BETAMETHASONE 1-0.05 % EX CREA: 1.0000 "application " | TOPICAL_CREAM | Freq: Two times a day (BID) | CUTANEOUS | Status: DC

## 2014-12-11 NOTE — Addendum Note (Signed)
Addended by: Haskel Khan A on: 12/11/2014 10:15 AM   Modules accepted: Orders
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
## 2014-12-14 ENCOUNTER — Encounter: Payer: Self-pay | Admitting: Gastroenterology

## 2014-12-14 LAB — URINE CULTURE

## 2014-12-14 NOTE — Telephone Encounter (Signed)
PT HAD LABS Nov 23, 2014 AT [facility]. S/P MICR RF ABLATION OF HCC. NO NEED FOR RECALL LABS UNTIL AFTER OPV DEC 2016.

## 2014-12-18 ENCOUNTER — Other Ambulatory Visit: Payer: Self-pay | Admitting: Internal Medicine

## 2014-12-18 ENCOUNTER — Other Ambulatory Visit: Payer: Self-pay | Admitting: Family Medicine

## 2014-12-18 NOTE — Telephone Encounter (Signed)
Refill appropriate and filled per protocol. 

## 2014-12-21 ENCOUNTER — Other Ambulatory Visit: Payer: Self-pay | Admitting: Family Medicine

## 2014-12-21 ENCOUNTER — Encounter: Payer: Self-pay | Admitting: Family Medicine

## 2014-12-21 NOTE — Telephone Encounter (Signed)
Medication refill for one time only.  Patient needs to be seen.  Letter sent for patient to call and schedule 

## 2015-01-02 ENCOUNTER — Other Ambulatory Visit: Payer: Medicare Other

## 2015-01-02 DIAGNOSIS — Z0000 Encounter for general adult medical examination without abnormal findings: Secondary | ICD-10-CM | POA: Diagnosis not present

## 2015-01-02 DIAGNOSIS — E785 Hyperlipidemia, unspecified: Secondary | ICD-10-CM | POA: Diagnosis not present

## 2015-01-02 DIAGNOSIS — N400 Enlarged prostate without lower urinary tract symptoms: Secondary | ICD-10-CM | POA: Diagnosis not present

## 2015-01-02 DIAGNOSIS — IMO0002 Reserved for concepts with insufficient information to code with codable children: Secondary | ICD-10-CM

## 2015-01-02 DIAGNOSIS — E1159 Type 2 diabetes mellitus with other circulatory complications: Secondary | ICD-10-CM | POA: Diagnosis not present

## 2015-01-02 DIAGNOSIS — E1165 Type 2 diabetes mellitus with hyperglycemia: Secondary | ICD-10-CM | POA: Diagnosis not present

## 2015-01-02 DIAGNOSIS — I10 Essential (primary) hypertension: Secondary | ICD-10-CM | POA: Diagnosis not present

## 2015-01-02 DIAGNOSIS — E538 Deficiency of other specified B group vitamins: Secondary | ICD-10-CM

## 2015-01-02 DIAGNOSIS — Z79899 Other long term (current) drug therapy: Secondary | ICD-10-CM | POA: Diagnosis not present

## 2015-01-03 LAB — TSH: TSH: 3.863 u[IU]/mL (ref 0.350–4.500)

## 2015-01-03 LAB — CBC WITH DIFFERENTIAL/PLATELET
BASOS PCT: 1 % (ref 0–1)
Basophils Absolute: 0 10*3/uL (ref 0.0–0.1)
Eosinophils Absolute: 0.2 10*3/uL (ref 0.0–0.7)
Eosinophils Relative: 5 % (ref 0–5)
HCT: 32.4 % — ABNORMAL LOW (ref 39.0–52.0)
HEMOGLOBIN: 10.8 g/dL — AB (ref 13.0–17.0)
LYMPHS ABS: 1 10*3/uL (ref 0.7–4.0)
Lymphocytes Relative: 29 % (ref 12–46)
MCH: 29.3 pg (ref 26.0–34.0)
MCHC: 33.3 g/dL (ref 30.0–36.0)
MCV: 87.8 fL (ref 78.0–100.0)
MONO ABS: 0.2 10*3/uL (ref 0.1–1.0)
Monocytes Relative: 7 % (ref 3–12)
NEUTROS ABS: 2 10*3/uL (ref 1.7–7.7)
Neutrophils Relative %: 58 % (ref 43–77)
Platelets: 69 10*3/uL — ABNORMAL LOW (ref 150–400)
RBC: 3.69 MIL/uL — ABNORMAL LOW (ref 4.22–5.81)
RDW: 15 % (ref 11.5–15.5)
WBC: 3.5 10*3/uL — ABNORMAL LOW (ref 4.0–10.5)

## 2015-01-03 LAB — COMPLETE METABOLIC PANEL WITH GFR
ALK PHOS: 83 U/L (ref 40–115)
ALT: 22 U/L (ref 9–46)
AST: 23 U/L (ref 10–35)
Albumin: 3.3 g/dL — ABNORMAL LOW (ref 3.6–5.1)
BUN: 17 mg/dL (ref 7–25)
CO2: 26 mmol/L (ref 20–31)
Calcium: 8.9 mg/dL (ref 8.6–10.3)
Chloride: 106 mmol/L (ref 98–110)
Creat: 0.72 mg/dL (ref 0.70–1.18)
GFR, Est African American: >89 mL/min (ref >=60)
Glucose, Bld: 163 mg/dL — ABNORMAL HIGH (ref 70–99)
POTASSIUM: 4.1 mmol/L (ref 3.5–5.3)
SODIUM: 139 mmol/L (ref 135–146)
Total Bilirubin: 0.7 mg/dL (ref 0.2–1.2)
Total Protein: 6.4 g/dL (ref 6.1–8.1)

## 2015-01-03 LAB — HEMOGLOBIN A1C
HEMOGLOBIN A1C: 7 % — AB (ref <5.7)
Mean Plasma Glucose: 154 mg/dL — ABNORMAL HIGH (ref <117)

## 2015-01-03 LAB — LIPID PANEL
Cholesterol: 117 mg/dL — ABNORMAL LOW (ref 125–200)
HDL: 42 mg/dL (ref >=40)
LDL CALC: 60 mg/dL (ref <130)
TRIGLYCERIDES: 75 mg/dL (ref <150)
Total CHOL/HDL Ratio: 2.8 Ratio (ref <=5.0)
VLDL: 15 mg/dL (ref <30)

## 2015-01-04 ENCOUNTER — Ambulatory Visit (INDEPENDENT_AMBULATORY_CARE_PROVIDER_SITE_OTHER): Payer: Medicare Other | Admitting: Family Medicine

## 2015-01-04 ENCOUNTER — Encounter: Payer: Self-pay | Admitting: Family Medicine

## 2015-01-04 VITALS — BP 123/64 | HR 72 | Temp 97.7°F | Resp 18 | Ht 70.0 in | Wt 193.0 lb

## 2015-01-04 DIAGNOSIS — I2510 Atherosclerotic heart disease of native coronary artery without angina pectoris: Secondary | ICD-10-CM

## 2015-01-04 DIAGNOSIS — D6489 Other specified anemias: Secondary | ICD-10-CM | POA: Diagnosis not present

## 2015-01-04 DIAGNOSIS — I10 Essential (primary) hypertension: Secondary | ICD-10-CM | POA: Diagnosis not present

## 2015-01-04 DIAGNOSIS — E785 Hyperlipidemia, unspecified: Secondary | ICD-10-CM

## 2015-01-04 DIAGNOSIS — E119 Type 2 diabetes mellitus without complications: Secondary | ICD-10-CM

## 2015-01-04 DIAGNOSIS — B182 Chronic viral hepatitis C: Secondary | ICD-10-CM | POA: Diagnosis not present

## 2015-01-04 DIAGNOSIS — Z0000 Encounter for general adult medical examination without abnormal findings: Secondary | ICD-10-CM

## 2015-01-04 MED ORDER — GLIPIZIDE 5 MG PO TABS: 5.0000 mg | ORAL_TABLET | Freq: Every day | ORAL | Status: DC
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
## 2015-01-05 LAB — PSA: PSA: 1.65 ng/mL (ref <=4.00)

## 2015-01-16 ENCOUNTER — Encounter: Payer: Self-pay | Admitting: Family Medicine

## 2015-01-16 ENCOUNTER — Other Ambulatory Visit: Payer: Self-pay | Admitting: Family Medicine

## 2015-01-16 ENCOUNTER — Ambulatory Visit (INDEPENDENT_AMBULATORY_CARE_PROVIDER_SITE_OTHER): Payer: Medicare Other | Admitting: Internal Medicine

## 2015-01-16 ENCOUNTER — Encounter: Payer: Self-pay | Admitting: Internal Medicine

## 2015-01-16 VITALS — BP 128/68 | HR 69 | Ht 70.0 in | Wt 200.0 lb

## 2015-01-16 DIAGNOSIS — R001 Bradycardia, unspecified: Secondary | ICD-10-CM | POA: Diagnosis not present

## 2015-01-16 DIAGNOSIS — I25700 Atherosclerosis of coronary artery bypass graft(s), unspecified, with unstable angina pectoris: Secondary | ICD-10-CM

## 2015-01-16 DIAGNOSIS — I499 Cardiac arrhythmia, unspecified: Secondary | ICD-10-CM

## 2015-01-16 DIAGNOSIS — I2510 Atherosclerotic heart disease of native coronary artery without angina pectoris: Secondary | ICD-10-CM

## 2015-01-16 DIAGNOSIS — Z950 Presence of cardiac pacemaker: Secondary | ICD-10-CM | POA: Diagnosis not present

## 2015-01-16 LAB — CUP PACEART INCLINIC DEVICE CHECK
Battery Impedance: 701 Ohm
Battery Remaining Longevity: 72 mo
Battery Voltage: 2.78 V
Brady Statistic AP VS Percent: 74 %
Brady Statistic AS VP Percent: 0 %
Implantable Lead Implant Date: 20100520
Implantable Lead Location: 753859
Implantable Lead Location: 753860
Implantable Lead Model: 4092
Lead Channel Impedance Value: 394 Ohm
Lead Channel Impedance Value: 512 Ohm
Lead Channel Pacing Threshold Amplitude: 0.5 V
Lead Channel Pacing Threshold Amplitude: 1.5 V
Lead Channel Pacing Threshold Amplitude: 2.5 V
Lead Channel Pacing Threshold Pulse Width: 0.4 ms
Lead Channel Pacing Threshold Pulse Width: 0.4 ms
Lead Channel Sensing Intrinsic Amplitude: 5.6 mV
Lead Channel Setting Pacing Amplitude: 2 V
Lead Channel Setting Pacing Pulse Width: 1 ms
Lead Channel Setting Sensing Sensitivity: 2 mV
MDC IDC LEAD IMPLANT DT: 20100520
MDC IDC MSMT LEADCHNL RA PACING THRESHOLD AMPLITUDE: 0.5 V
MDC IDC MSMT LEADCHNL RA SENSING INTR AMPL: 4 mV
MDC IDC MSMT LEADCHNL RV PACING THRESHOLD PULSEWIDTH: 0.4 ms
MDC IDC MSMT LEADCHNL RV PACING THRESHOLD PULSEWIDTH: 1 ms
MDC IDC SESS DTM: 20161213103901
MDC IDC SET LEADCHNL RV PACING AMPLITUDE: 3 V
MDC IDC STAT BRADY AP VP PERCENT: 0 %
MDC IDC STAT BRADY AS VS PERCENT: 25 %

## 2015-01-16 NOTE — Patient Instructions (Signed)
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
## 2015-01-16 NOTE — Telephone Encounter (Signed)
Medication refilled per protocol. 

## 2015-01-16 NOTE — Assessment & Plan Note (Signed)
He remains active and has no anginal symptoms. He will continue his current meds.

## 2015-01-16 NOTE — Assessment & Plan Note (Signed)
His Medtronic PPM is working normally. His RV threshold is increased but he paces in the RV very infrequently. Will follow.

## 2015-01-17 ENCOUNTER — Other Ambulatory Visit: Payer: Self-pay

## 2015-01-17 ENCOUNTER — Encounter: Payer: Self-pay | Admitting: Gastroenterology

## 2015-01-17 ENCOUNTER — Ambulatory Visit (INDEPENDENT_AMBULATORY_CARE_PROVIDER_SITE_OTHER): Payer: Medicare Other | Admitting: Gastroenterology

## 2015-01-17 VITALS — BP 125/59 | HR 69 | Temp 97.9°F | Ht 70.0 in | Wt 196.0 lb

## 2015-01-17 DIAGNOSIS — D649 Anemia, unspecified: Secondary | ICD-10-CM | POA: Diagnosis not present

## 2015-01-17 DIAGNOSIS — I2510 Atherosclerotic heart disease of native coronary artery without angina pectoris: Secondary | ICD-10-CM | POA: Diagnosis not present

## 2015-01-17 DIAGNOSIS — C220 Liver cell carcinoma: Secondary | ICD-10-CM

## 2015-01-17 DIAGNOSIS — B182 Chronic viral hepatitis C: Secondary | ICD-10-CM | POA: Diagnosis not present

## 2015-01-17 DIAGNOSIS — K7460 Unspecified cirrhosis of liver: Secondary | ICD-10-CM

## 2015-01-17 LAB — BASIC METABOLIC PANEL
BUN: 15 mg/dL (ref 7–25)
CALCIUM: 8.9 mg/dL (ref 8.6–10.3)
CO2: 27 mmol/L (ref 20–31)
CREATININE: 0.7 mg/dL (ref 0.70–1.18)
Chloride: 102 mmol/L (ref 98–110)
GLUCOSE: 116 mg/dL — AB (ref 65–99)
Potassium: 4.3 mmol/L (ref 3.5–5.3)
Sodium: 135 mmol/L (ref 135–146)

## 2015-01-17 LAB — FERRITIN: FERRITIN: 17 ng/mL — AB (ref 22–322)

## 2015-01-17 NOTE — Assessment & Plan Note (Signed)
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 LAST AFP DEC 2016-1.65  REPEAT CT AT [facility]. IMAGES WILL BE COPIED. PT INSTRUCTED TO TAKE DISC TO OPV AT [facility].

## 2015-01-17 NOTE — Patient Instructions (Signed)
COMPLETE CT SCAN AT [facility]. GET A COPY OF THE IMAGES TO TAKE TO YOUR VISIT AT [facility].  COMPLETE BLOOD DRAW. I AM CHECKING YOUR IRON STORES.  YOU NEED LABS ONE WEEK PRIOR TO NEXT VISIT IN 6 MOS.  FOLLOW UP IN 6 MOS. MERRY CHRISTMAS AND HAPPY NEW YEAR!

## 2015-01-17 NOTE — Assessment & Plan Note (Signed)
LIKELY DUE TO CHRONIC DISEASE & ASSOCIATED WITH ACUTE HEMATURIA AFTER ABLATION OF LIVER LESION. NO INDICATION FOR ENDOSCOPY AT THIS TIME. NO BRBPR OR MELENA.  FERRITIN TODAY. CONTINUE TO MONITOR CBCs. NO INDICATION FOR ENDOSCOPY AT THIS TIME. PT WOULD NEED 24HR OBS FOR CAPSULE STUDYDUE TO PACEMEAKER

## 2015-01-17 NOTE — Assessment & Plan Note (Signed)
WELL COMPENSATED DISEASE. NO BRBPR OR MELENA. EGD/TCS PO:[id number]). PCP CONCERNED ABOUT NORMOCYTIC ANEMIA HB DOWN FROM 12 TO 10.8 AFTER MICROWAVE ABLATION & MOST LIKELY DUE TO HEMATURIA. PT THOUGHT TO HAVE ANEMIA OF CHRONIC DISEASE.  COMPLETE CT SCAN AT [facility]. GET A COPY OF THE IMAGES TO TAKE TO YOUR VISIT AT [facility]. COMPLETE FERRITIN. IF > 100-150, WOULD STOP IRON. LABS ONE WEEK PRIOR TO NEXT VISIT IN 6 MOS-PT/INR/AFP/CBC/CMP. FOLLOW UP IN 6 MOS.

## 2015-01-17 NOTE — Progress Notes (Signed)
ON RECALL  °
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
## 2015-01-18 ENCOUNTER — Other Ambulatory Visit: Payer: Self-pay

## 2015-01-18 ENCOUNTER — Telehealth: Payer: Self-pay | Admitting: Gastroenterology

## 2015-01-18 DIAGNOSIS — D649 Anemia, unspecified: Secondary | ICD-10-CM

## 2015-01-18 NOTE — Telephone Encounter (Signed)
Reminder in epic °

## 2015-01-18 NOTE — Progress Notes (Signed)
cc'ed to pcp °

## 2015-01-18 NOTE — Telephone Encounter (Signed)
PLEASE CALL PT. HIS IRON STORES ARE LOW. HE SHOULD CONTINUE THE IRON PILLS. HE NEEDS A CBC/FERRITIN AFTER IN [facility] 2017.

## 2015-01-18 NOTE — Telephone Encounter (Signed)
Pt's daughter. Joycelyn Schmid is aware. Lab orders on file for March.

## 2015-01-23 ENCOUNTER — Ambulatory Visit (HOSPITAL_COMMUNITY)
Admission: RE | Admit: 2015-01-23 | Discharge: 2015-01-23 | Disposition: A | Discharge status: Home / Self Care | Payer: Medicare Other | Source: Ambulatory Visit | Attending: Gastroenterology | Admitting: Gastroenterology

## 2015-01-23 DIAGNOSIS — K7460 Unspecified cirrhosis of liver: Secondary | ICD-10-CM | POA: Insufficient documentation

## 2015-01-23 DIAGNOSIS — R161 Splenomegaly, not elsewhere classified: Secondary | ICD-10-CM | POA: Diagnosis not present

## 2015-01-23 DIAGNOSIS — I2510 Atherosclerotic heart disease of native coronary artery without angina pectoris: Secondary | ICD-10-CM | POA: Insufficient documentation

## 2015-01-23 DIAGNOSIS — K439 Ventral hernia without obstruction or gangrene: Secondary | ICD-10-CM | POA: Insufficient documentation

## 2015-01-23 MED ORDER — IOHEXOL 300 MG/ML  SOLN
100.0000 mL | Freq: Once | INTRAMUSCULAR | Status: AC | PRN
  Administered 2015-01-23: 100 mL via INTRAVENOUS

## 2015-02-06 ENCOUNTER — Encounter: Payer: Medicare Other | Admitting: Family Medicine
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
## 2015-02-08 ENCOUNTER — Telehealth: Payer: Self-pay | Admitting: Gastroenterology

## 2015-02-08 NOTE — Telephone Encounter (Signed)
PLEASE CALL PT. HIS CT SHOWS HIS LIVER TUMOR IS RESOLVED. HE NEEDS A REPEAT CT ABD W/ AND W/O IVC/ORAL CONTRAST IN 3 MOS.

## 2015-02-08 NOTE — Telephone Encounter (Signed)
Reminder in epic °

## 2015-02-08 NOTE — Telephone Encounter (Signed)
PT's daughter, Joycelyn Schmid, is aware.

## 2015-02-15 DIAGNOSIS — C220 Liver cell carcinoma: Secondary | ICD-10-CM | POA: Diagnosis not present

## 2015-02-22 DIAGNOSIS — C220 Liver cell carcinoma: Secondary | ICD-10-CM | POA: Diagnosis not present

## 2015-03-08 DIAGNOSIS — J449 Chronic obstructive pulmonary disease, unspecified: Secondary | ICD-10-CM | POA: Diagnosis not present

## 2015-03-08 DIAGNOSIS — Z87891 Personal history of nicotine dependence: Secondary | ICD-10-CM | POA: Diagnosis not present

## 2015-03-08 DIAGNOSIS — C220 Liver cell carcinoma: Secondary | ICD-10-CM | POA: Diagnosis not present

## 2015-03-08 DIAGNOSIS — N400 Enlarged prostate without lower urinary tract symptoms: Secondary | ICD-10-CM | POA: Diagnosis not present

## 2015-03-08 DIAGNOSIS — I10 Essential (primary) hypertension: Secondary | ICD-10-CM | POA: Diagnosis not present

## 2015-03-08 DIAGNOSIS — Z79899 Other long term (current) drug therapy: Secondary | ICD-10-CM | POA: Diagnosis not present

## 2015-03-08 DIAGNOSIS — Z7984 Long term (current) use of oral hypoglycemic drugs: Secondary | ICD-10-CM | POA: Diagnosis not present

## 2015-03-08 DIAGNOSIS — Z7982 Long term (current) use of aspirin: Secondary | ICD-10-CM | POA: Diagnosis not present

## 2015-03-08 DIAGNOSIS — E785 Hyperlipidemia, unspecified: Secondary | ICD-10-CM | POA: Diagnosis not present

## 2015-03-08 DIAGNOSIS — K219 Gastro-esophageal reflux disease without esophagitis: Secondary | ICD-10-CM | POA: Diagnosis not present

## 2015-03-08 DIAGNOSIS — D696 Thrombocytopenia, unspecified: Secondary | ICD-10-CM | POA: Diagnosis not present

## 2015-03-08 DIAGNOSIS — E119 Type 2 diabetes mellitus without complications: Secondary | ICD-10-CM | POA: Diagnosis not present

## 2015-03-08 DIAGNOSIS — B1920 Unspecified viral hepatitis C without hepatic coma: Secondary | ICD-10-CM | POA: Diagnosis not present

## 2015-03-16 DIAGNOSIS — J449 Chronic obstructive pulmonary disease, unspecified: Secondary | ICD-10-CM | POA: Diagnosis not present

## 2015-03-16 DIAGNOSIS — I10 Essential (primary) hypertension: Secondary | ICD-10-CM | POA: Diagnosis not present

## 2015-03-16 DIAGNOSIS — E119 Type 2 diabetes mellitus without complications: Secondary | ICD-10-CM | POA: Diagnosis not present

## 2015-03-16 DIAGNOSIS — B1920 Unspecified viral hepatitis C without hepatic coma: Secondary | ICD-10-CM | POA: Diagnosis not present

## 2015-03-16 DIAGNOSIS — C220 Liver cell carcinoma: Secondary | ICD-10-CM | POA: Diagnosis not present

## 2015-03-16 DIAGNOSIS — K219 Gastro-esophageal reflux disease without esophagitis: Secondary | ICD-10-CM | POA: Diagnosis not present

## 2015-03-17 DIAGNOSIS — B1920 Unspecified viral hepatitis C without hepatic coma: Secondary | ICD-10-CM | POA: Diagnosis not present

## 2015-03-17 DIAGNOSIS — I10 Essential (primary) hypertension: Secondary | ICD-10-CM | POA: Diagnosis not present

## 2015-03-17 DIAGNOSIS — E119 Type 2 diabetes mellitus without complications: Secondary | ICD-10-CM | POA: Diagnosis not present

## 2015-03-17 DIAGNOSIS — J449 Chronic obstructive pulmonary disease, unspecified: Secondary | ICD-10-CM | POA: Diagnosis not present

## 2015-03-17 DIAGNOSIS — C220 Liver cell carcinoma: Secondary | ICD-10-CM | POA: Diagnosis not present

## 2015-03-17 DIAGNOSIS — K219 Gastro-esophageal reflux disease without esophagitis: Secondary | ICD-10-CM | POA: Diagnosis not present

## 2015-03-19 ENCOUNTER — Other Ambulatory Visit: Payer: Self-pay

## 2015-03-19 DIAGNOSIS — D649 Anemia, unspecified: Secondary | ICD-10-CM

## 2015-03-20 ENCOUNTER — Telehealth: Payer: Self-pay | Admitting: Gastroenterology

## 2015-03-20 NOTE — Telephone Encounter (Signed)
ON RECALL FOR CBC AND FERRITIN AFTER 04/2015

## 2015-03-21 NOTE — Telephone Encounter (Signed)
Orders were mailed by [facility].

## 2015-03-27 DIAGNOSIS — D649 Anemia, unspecified: Secondary | ICD-10-CM | POA: Diagnosis not present

## 2015-03-27 LAB — CBC WITH DIFFERENTIAL/PLATELET
BASOS PCT: 1 % (ref 0–1)
Basophils Absolute: 0 10*3/uL (ref 0.0–0.1)
EOS PCT: 4 % (ref 0–5)
Eosinophils Absolute: 0.2 10*3/uL (ref 0.0–0.7)
HEMATOCRIT: 34.2 % — AB (ref 39.0–52.0)
HEMOGLOBIN: 11.1 g/dL — AB (ref 13.0–17.0)
Lymphocytes Relative: 18 % (ref 12–46)
Lymphs Abs: 0.8 10*3/uL (ref 0.7–4.0)
MCH: 28.8 pg (ref 26.0–34.0)
MCHC: 32.5 g/dL (ref 30.0–36.0)
MCV: 88.8 fL (ref 78.0–100.0)
MONO ABS: 0.4 10*3/uL (ref 0.1–1.0)
MONOS PCT: 8 % (ref 3–12)
MPV: 11.3 fL (ref 8.6–12.4)
NEUTROS ABS: 3.2 10*3/uL (ref 1.7–7.7)
Neutrophils Relative %: 69 % (ref 43–77)
Platelets: 108 10*3/uL — ABNORMAL LOW (ref 150–400)
RBC: 3.85 MIL/uL — AB (ref 4.22–5.81)
RDW: 14.6 % (ref 11.5–15.5)
WBC: 4.6 10*3/uL (ref 4.0–10.5)

## 2015-03-28 LAB — FERRITIN: FERRITIN: 39 ng/mL (ref 20–380)

## 2015-03-29 ENCOUNTER — Encounter: Payer: Self-pay | Admitting: Family Medicine

## 2015-03-29 ENCOUNTER — Ambulatory Visit (INDEPENDENT_AMBULATORY_CARE_PROVIDER_SITE_OTHER): Payer: Medicare Other | Admitting: Family Medicine

## 2015-03-29 VITALS — BP 110/60 | HR 78 | Temp 98.1°F | Resp 18 | Ht 70.0 in | Wt 194.0 lb

## 2015-03-29 DIAGNOSIS — H918X1 Other specified hearing loss, right ear: Secondary | ICD-10-CM

## 2015-03-29 DIAGNOSIS — H6121 Impacted cerumen, right ear: Secondary | ICD-10-CM
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
## 2015-04-10 ENCOUNTER — Telehealth: Payer: Self-pay | Admitting: Gastroenterology

## 2015-04-10 NOTE — Telephone Encounter (Signed)
PLEASE CALL PT. HIS BLOOD COUNT IS STABLE. HIS IRON STORES ARE NORMAL.

## 2015-04-11 NOTE — Telephone Encounter (Signed)
LMOM to call.

## 2015-04-12 NOTE — Telephone Encounter (Signed)
Letter mailed to call for results.  

## 2015-04-17 ENCOUNTER — Telehealth: Payer: Self-pay | Admitting: Cardiology

## 2015-04-17 ENCOUNTER — Encounter: Payer: Medicare Other | Admitting: *Deleted

## 2015-04-17 NOTE — Telephone Encounter (Signed)
Confirmed remote transmission w/ pt daughter.   

## 2015-04-18 NOTE — Telephone Encounter (Signed)
PT came by the office and was informed of his results.

## 2015-04-20 ENCOUNTER — Encounter: Payer: Self-pay | Admitting: Cardiology

## 2015-04-23 ENCOUNTER — Ambulatory Visit (INDEPENDENT_AMBULATORY_CARE_PROVIDER_SITE_OTHER): Payer: Medicare Other | Admitting: *Deleted

## 2015-04-23 DIAGNOSIS — R001 Bradycardia, unspecified: Secondary | ICD-10-CM | POA: Diagnosis not present

## 2015-04-23 DIAGNOSIS — Z950 Presence of cardiac pacemaker: Secondary | ICD-10-CM | POA: Diagnosis not present

## 2015-04-25 ENCOUNTER — Telehealth: Payer: Self-pay | Admitting: Gastroenterology

## 2015-04-25 NOTE — Telephone Encounter (Signed)
April RECALL FOR REPEAT CTABD W AND WO

## 2015-04-25 NOTE — Telephone Encounter (Signed)
Mailed letter °

## 2015-04-27 ENCOUNTER — Other Ambulatory Visit: Payer: Self-pay | Admitting: Family Medicine

## 2015-04-27 DIAGNOSIS — E119 Type 2 diabetes mellitus without complications: Secondary | ICD-10-CM

## 2015-04-27 MED ORDER — GLUCOSE BLOOD VI STRP: 1.0000 | ORAL_STRIP | Freq: Every day | Status: DC

## 2015-04-27 MED ORDER — LANCETS 30G MISC: 1.0000 | Freq: Every day | Status: DC

## 2015-04-27 NOTE — Progress Notes (Signed)
Remote pacemaker transmission.   

## 2015-05-01 ENCOUNTER — Other Ambulatory Visit: Payer: Self-pay

## 2015-05-01 DIAGNOSIS — B182 Chronic viral hepatitis C: Secondary | ICD-10-CM

## 2015-05-01 DIAGNOSIS — R160 Hepatomegaly, not elsewhere classified: Secondary | ICD-10-CM

## 2015-05-01 DIAGNOSIS — K7460 Unspecified cirrhosis of liver: Secondary | ICD-10-CM

## 2015-05-08 ENCOUNTER — Ambulatory Visit (HOSPITAL_COMMUNITY)
Admission: RE | Admit: 2015-05-08 | Discharge: 2015-05-08 | Disposition: A | Discharge status: Home / Self Care | Payer: Medicare Other | Source: Ambulatory Visit | Attending: Gastroenterology | Admitting: Gastroenterology

## 2015-05-08 DIAGNOSIS — I2584 Coronary atherosclerosis due to calcified coronary lesion: Secondary | ICD-10-CM | POA: Insufficient documentation

## 2015-05-08 DIAGNOSIS — B182 Chronic viral hepatitis C: Secondary | ICD-10-CM | POA: Diagnosis present

## 2015-05-08 DIAGNOSIS — K7460 Unspecified cirrhosis of liver: Secondary | ICD-10-CM | POA: Insufficient documentation

## 2015-05-08 DIAGNOSIS — R160 Hepatomegaly, not elsewhere classified: Secondary | ICD-10-CM

## 2015-05-08 DIAGNOSIS — K439 Ventral hernia without obstruction or gangrene: Secondary | ICD-10-CM | POA: Insufficient documentation

## 2015-05-08 LAB — POCT I-STAT CREATININE: Creatinine, Ser: 0.7 mg/dL (ref 0.61–1.24)

## 2015-05-08 MED ORDER — IOHEXOL 300 MG/ML  SOLN
100.0000 mL | Freq: Once | INTRAMUSCULAR | Status: AC | PRN
  Administered 2015-05-08: 100 mL via INTRAVENOUS

## 2015-05-14 ENCOUNTER — Encounter: Payer: Self-pay | Admitting: Family Medicine

## 2015-05-14 ENCOUNTER — Ambulatory Visit (INDEPENDENT_AMBULATORY_CARE_PROVIDER_SITE_OTHER): Payer: Medicare Other | Admitting: Family Medicine

## 2015-05-14 VITALS — BP 112/64 | HR 80 | Temp 97.4°F | Resp 16 | Ht 70.0 in | Wt 194.0 lb

## 2015-05-14 DIAGNOSIS — E119 Type 2 diabetes mellitus without complications: Secondary | ICD-10-CM

## 2015-05-14 DIAGNOSIS — I2510 Atherosclerotic heart disease of native coronary artery without angina pectoris: Secondary | ICD-10-CM

## 2015-05-14 DIAGNOSIS — I10 Essential (primary) hypertension: Secondary | ICD-10-CM | POA: Diagnosis not present
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
## 2015-05-15 DIAGNOSIS — L600 Ingrowing nail: Secondary | ICD-10-CM | POA: Diagnosis not present

## 2015-05-15 LAB — CBC WITH DIFFERENTIAL/PLATELET
BASOS ABS: 48 {cells}/uL (ref 0–200)
Basophils Relative: 1 %
EOS PCT: 3 %
Eosinophils Absolute: 144 cells/uL (ref 15–500)
HCT: 36.1 % — ABNORMAL LOW (ref 38.5–50.0)
HEMOGLOBIN: 11.7 g/dL — AB (ref 13.0–17.0)
LYMPHS ABS: 816 {cells}/uL — AB (ref 850–3900)
LYMPHS PCT: 17 %
MCH: 29.8 pg (ref 27.0–33.0)
MCHC: 32.4 g/dL (ref 32.0–36.0)
MCV: 91.9 fL (ref 80.0–100.0)
MONOS PCT: 8 %
Monocytes Absolute: 384 cells/uL (ref 200–950)
NEUTROS PCT: 71 %
Neutro Abs: 3408 cells/uL (ref 1500–7800)
Platelets: 76 10*3/uL — ABNORMAL LOW (ref 140–400)
RBC: 3.93 MIL/uL — AB (ref 4.20–5.80)
RDW: 14.7 % (ref 11.0–15.0)
WBC: 4.8 10*3/uL (ref 3.8–10.8)

## 2015-05-15 LAB — COMPLETE METABOLIC PANEL WITH GFR
ALBUMIN: 3.7 g/dL (ref 3.6–5.1)
ALK PHOS: 82 U/L (ref 40–115)
ALT: 17 U/L (ref 9–46)
AST: 18 U/L (ref 10–35)
BILIRUBIN TOTAL: 0.7 mg/dL (ref 0.2–1.2)
BUN: 20 mg/dL (ref 7–25)
CO2: 21 mmol/L (ref 20–31)
Calcium: 9.2 mg/dL (ref 8.6–10.3)
Chloride: 104 mmol/L (ref 98–110)
Creat: 0.84 mg/dL (ref 0.70–1.18)
GFR, EST NON AFRICAN AMERICAN: 86 mL/min (ref >=60)
GLUCOSE: 176 mg/dL — AB (ref 70–99)
POTASSIUM: 4.2 mmol/L (ref 3.5–5.3)
SODIUM: 136 mmol/L (ref 135–146)
TOTAL PROTEIN: 6.6 g/dL (ref 6.1–8.1)

## 2015-05-15 LAB — HEMOGLOBIN A1C
HEMOGLOBIN A1C: 8.1 % — AB (ref <5.7)
MEAN PLASMA GLUCOSE: 186 mg/dL

## 2015-05-17 ENCOUNTER — Other Ambulatory Visit: Payer: Self-pay | Admitting: Family Medicine

## 2015-05-17 MED ORDER — EMPAGLIFLOZIN 25 MG PO TABS: 25.0000 mg | ORAL_TABLET | Freq: Every day | ORAL | Status: DC

## 2015-05-20 ENCOUNTER — Telehealth: Payer: Self-pay | Admitting: Gastroenterology

## 2015-05-20 NOTE — Telephone Encounter (Signed)
PLEASE CALL PT. HE HAS A NEW LESION IN THE RIGHT LOBE OF HIS LIVER. WE WILL FAX REPORT TO [facility]. HE SHOULD COME TO RADIOLOGY TO PICK UP A CD WITH HIS IMAGES TO TAKE WITH HIM TO HIS FOLLOW UP APPT. OPV JUN 2017 WITH DR. Lancelot Alyea.

## 2015-05-21 NOTE — Telephone Encounter (Signed)
Pt is on recall list for June 2017 with [facility]

## 2015-05-22 NOTE — Telephone Encounter (Signed)
Pt and his daughter, Joycelyn Schmid, came by and are aware and will go by Radiology for the CD to take to the doctor at [facility] when he has his next appt in early May.

## 2015-05-22 NOTE — Telephone Encounter (Signed)
Pt is aware but he also wants to come by the office to go over the results.

## 2015-05-22 NOTE — Telephone Encounter (Signed)
LMOM to call.

## 2015-05-23 ENCOUNTER — Encounter: Payer: Self-pay | Admitting: Cardiology

## 2015-05-23 LAB — CUP PACEART REMOTE DEVICE CHECK
Battery Impedance: 828 Ohm
Battery Voltage: 2.78 V
Brady Statistic AP VP Percent: 0 %
Brady Statistic AP VS Percent: 81 %
Brady Statistic AS VP Percent: 0 %
Brady Statistic AS VS Percent: 19 %
Implantable Lead Implant Date: 20100520
Implantable Lead Implant Date: 20100520
Implantable Lead Location: 753860
Implantable Lead Model: 4092
Implantable Lead Model: 5076
Lead Channel Impedance Value: 422 Ohm
Lead Channel Impedance Value: 611 Ohm
Lead Channel Pacing Threshold Amplitude: 0.5 V
Lead Channel Pacing Threshold Pulse Width: 0.4 ms
Lead Channel Sensing Intrinsic Amplitude: 5.6 mV
Lead Channel Setting Pacing Amplitude: 3.25 V
MDC IDC LEAD LOCATION: 753859
MDC IDC MSMT BATTERY REMAINING LONGEVITY: 68 mo
MDC IDC MSMT LEADCHNL RV PACING THRESHOLD AMPLITUDE: 1.625 V
MDC IDC MSMT LEADCHNL RV PACING THRESHOLD PULSEWIDTH: 0.4 ms
MDC IDC SESS DTM: 20170320195108
MDC IDC SET LEADCHNL RA PACING AMPLITUDE: 2 V
MDC IDC SET LEADCHNL RV PACING PULSEWIDTH: 0.4 ms
MDC IDC SET LEADCHNL RV SENSING SENSITIVITY: 2 mV

## 2015-05-29 DIAGNOSIS — L600 Ingrowing nail: Secondary | ICD-10-CM | POA: Diagnosis not present

## 2015-05-29 NOTE — Telephone Encounter (Signed)
app on 04/28 per CARE Everywhere at [facility]

## 2015-06-01 DIAGNOSIS — C220 Liver cell carcinoma: Secondary | ICD-10-CM | POA: Diagnosis not present

## 2015-06-01 DIAGNOSIS — Z950 Presence of cardiac pacemaker: Secondary | ICD-10-CM | POA: Diagnosis not present

## 2015-06-07 DIAGNOSIS — C220 Liver cell carcinoma: Secondary | ICD-10-CM | POA: Diagnosis not present

## 2015-06-18 ENCOUNTER — Encounter: Payer: Self-pay | Admitting: Gastroenterology

## 2015-06-18 ENCOUNTER — Other Ambulatory Visit: Payer: Self-pay | Admitting: Family Medicine

## 2015-06-18 NOTE — Telephone Encounter (Signed)
Refill appropriate and filled per protocol. 

## 2015-06-26 IMAGING — CR DG CHEST 2V
2 series · 2 of 2 positions shown · non-contrast
Comparison: None.

CLINICAL DATA: Cough, chills

EXAM:
CHEST  2 VIEW

[view not recorded (1 of 2)]
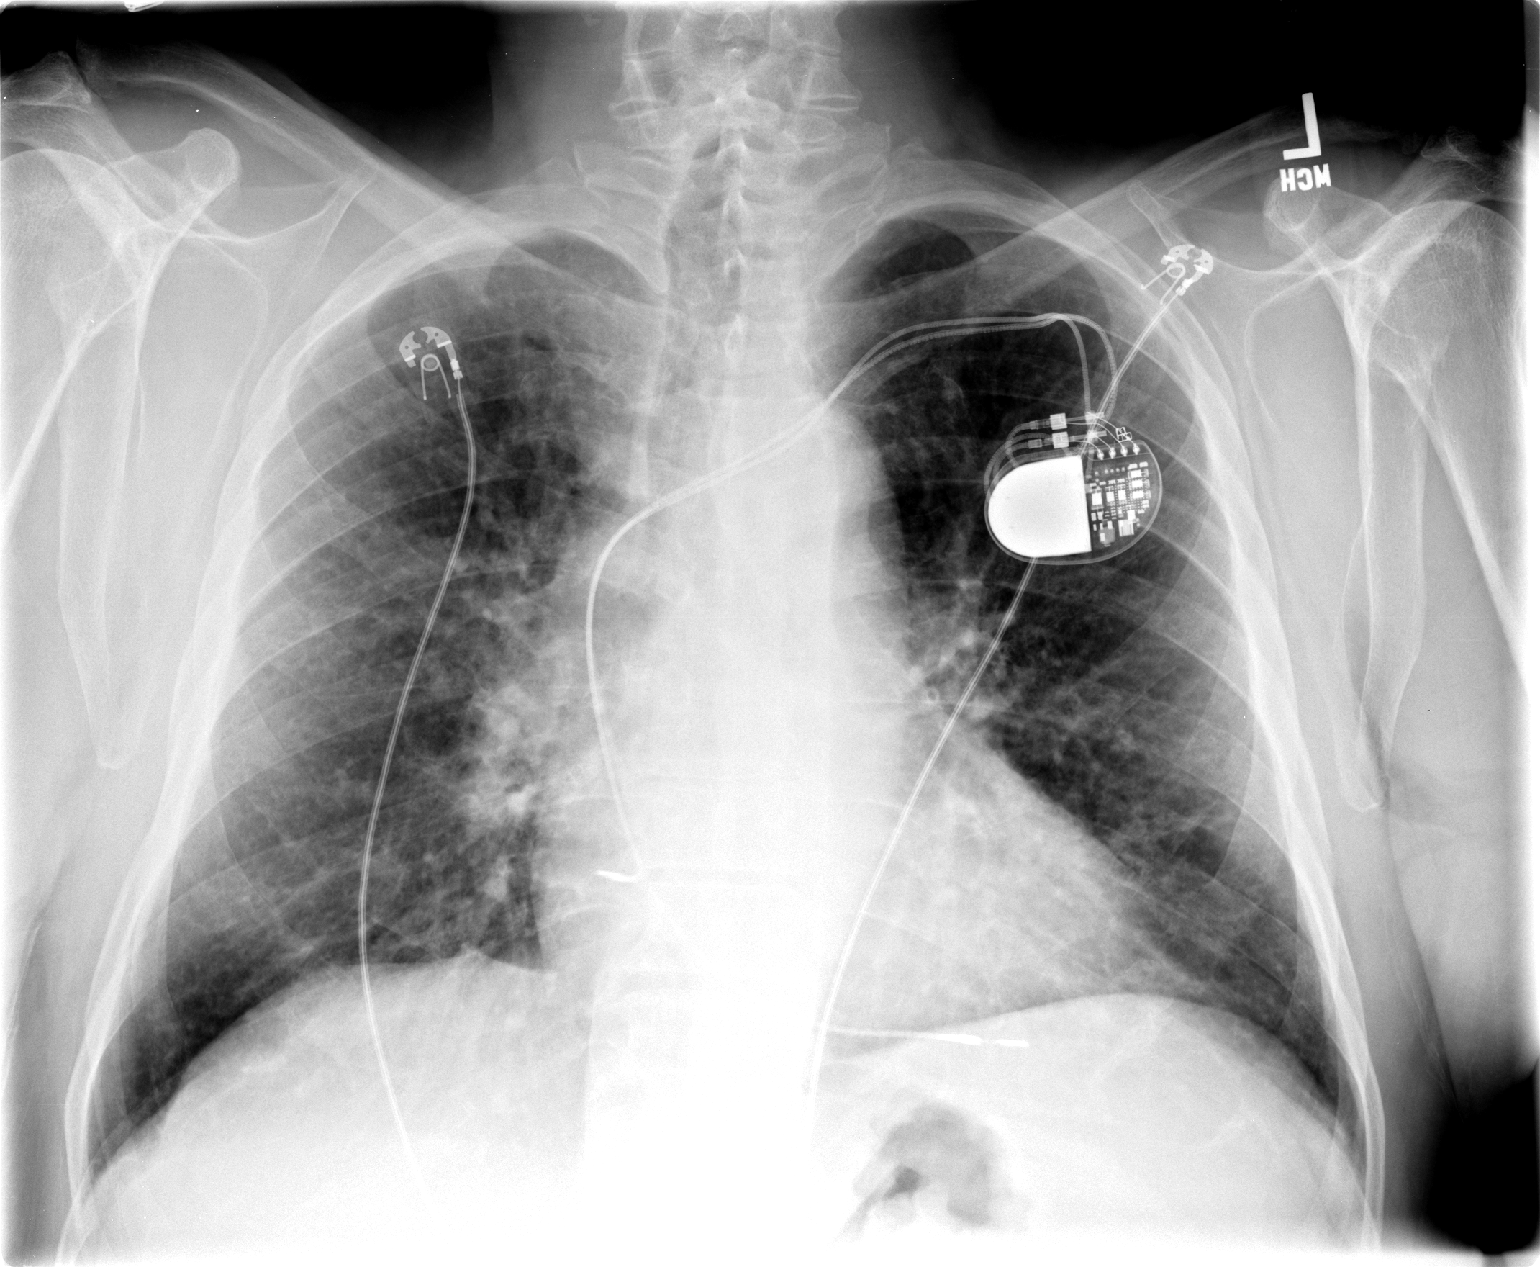

[view not recorded (2 of 2)]
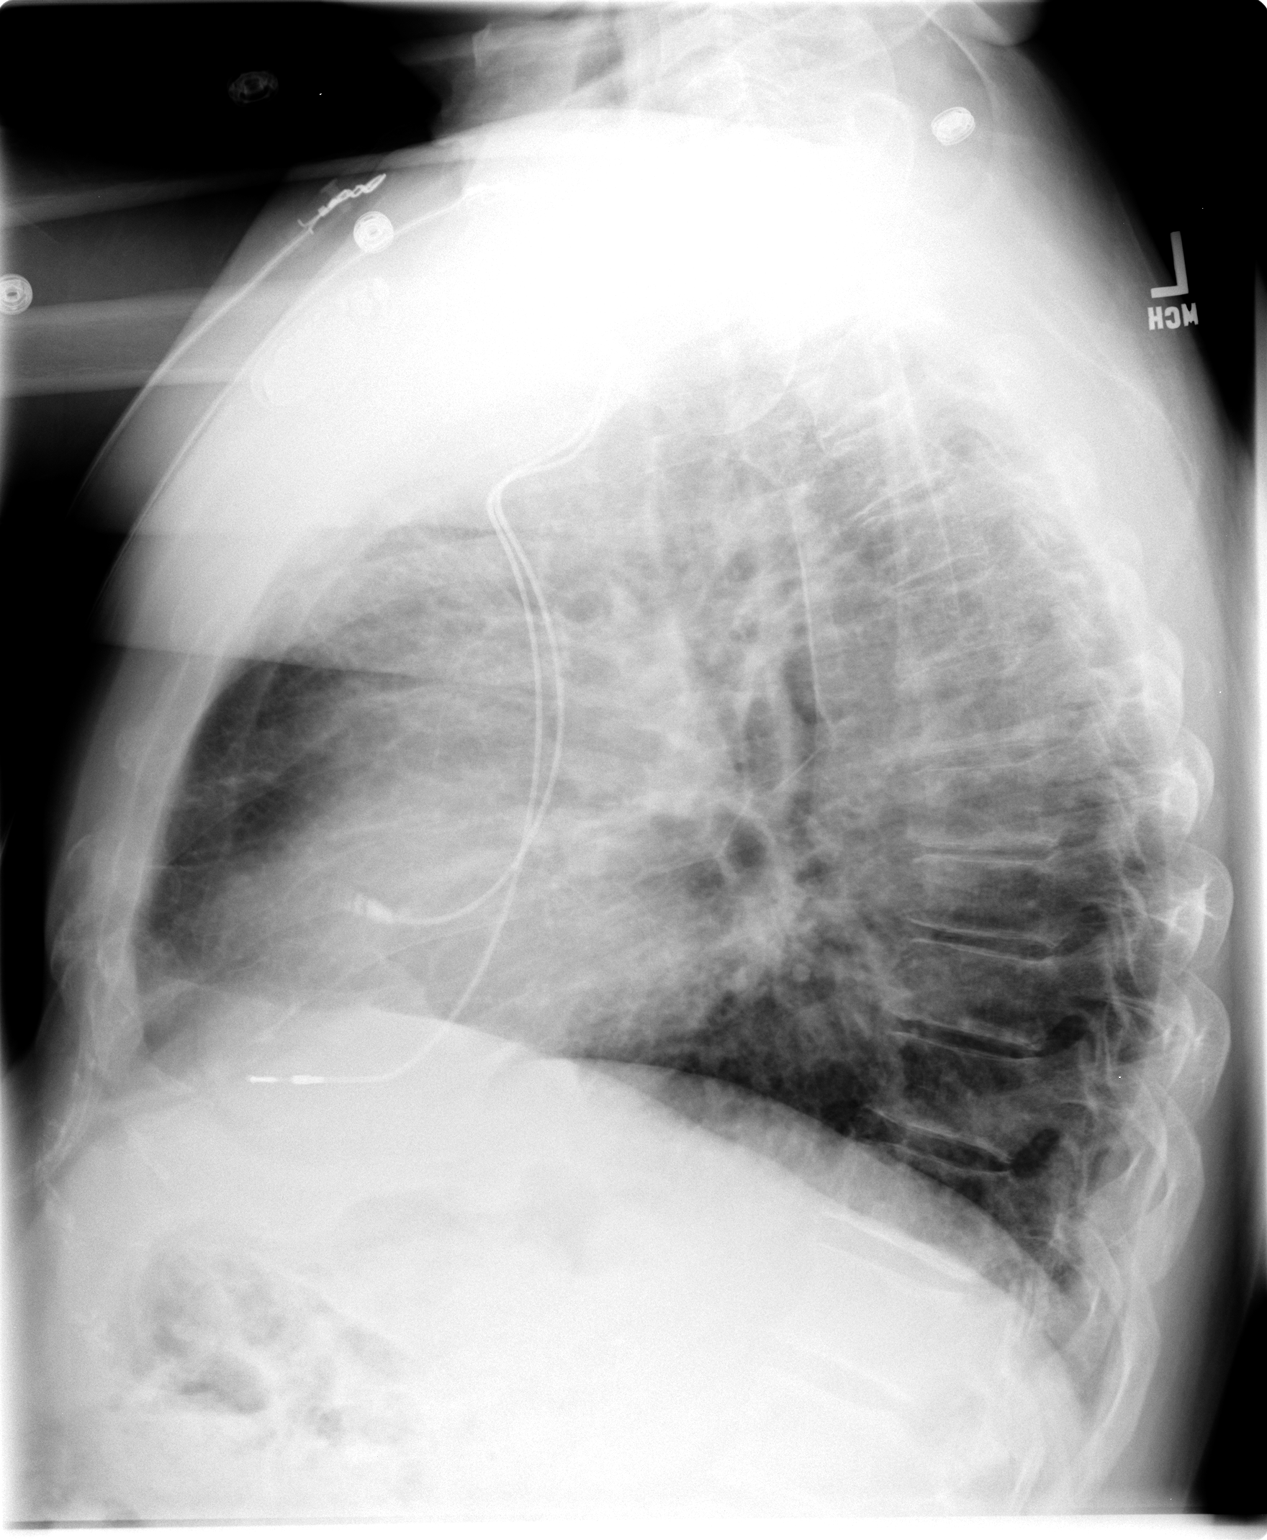

[2 of 2 positions shown; findings below may reference images not displayed]

FINDINGS: Cardiomediastinal silhouette is unremarkable. Dual lead cardiac
pacemaker with leads in right atrium and right ventricle. No acute
infiltrate or pulmonary edema. Central mild bronchitic changes.
IMPRESSION: No acute infiltrate or pulmonary edema. Central mild bronchitic
changes.

## 2015-06-27 ENCOUNTER — Other Ambulatory Visit: Payer: Self-pay | Admitting: Internal Medicine

## 2015-07-05 NOTE — Telephone Encounter (Signed)
LMOM for a return call.  

## 2015-07-05 NOTE — Telephone Encounter (Signed)
PLEASE CALL PT. CONFIRM HE SAW RADIOLOGY DOCS AT [facility].

## 2015-07-05 NOTE — Telephone Encounter (Signed)
REVIEWED-NO ADDITIONAL RECOMMENDATIONS. 

## 2015-07-05 NOTE — Telephone Encounter (Signed)
[name] [name] returned call. Pt was seen at [facility] on 06/01/2015. She said [name] [name] was told everything is stable now and they will do another MRI in 3 months.

## 2015-07-16 ENCOUNTER — Other Ambulatory Visit: Payer: Self-pay | Admitting: Family Medicine

## 2015-07-16 NOTE — Telephone Encounter (Signed)
Medication refilled per protocol. 

## 2015-07-18 IMAGING — CR DG FOOT COMPLETE 3+V*R*
3 series · 3 of 3 positions shown · non-contrast
Comparison: None.

CLINICAL DATA: Right foot ulceration.

EXAM:
RIGHT FOOT COMPLETE - 3+ VIEW

[view not recorded (1 of 3)]
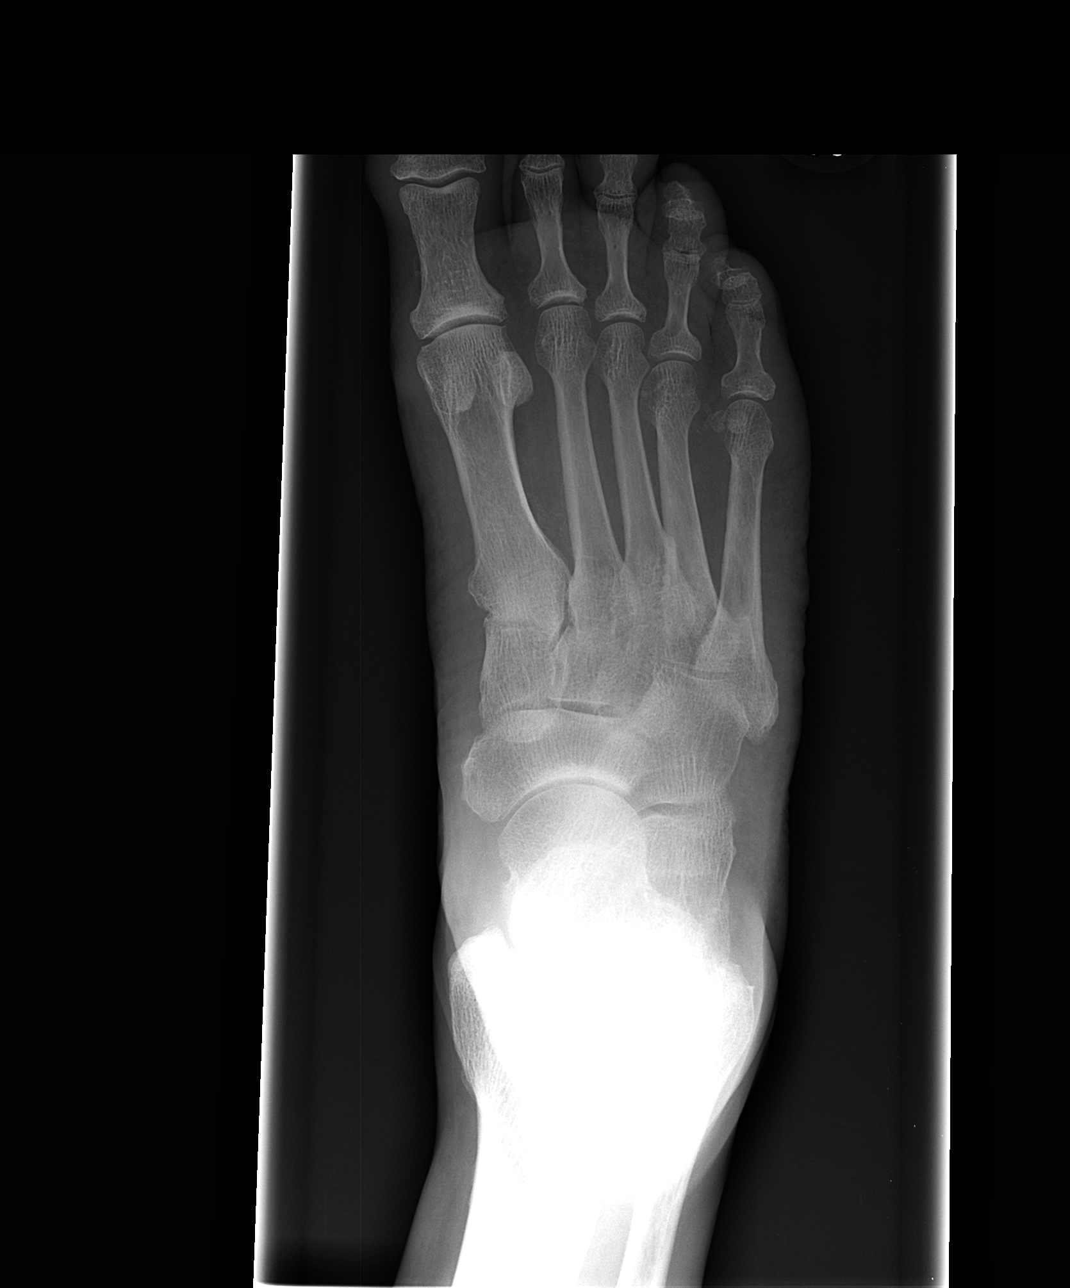

[view not recorded (2 of 3)]
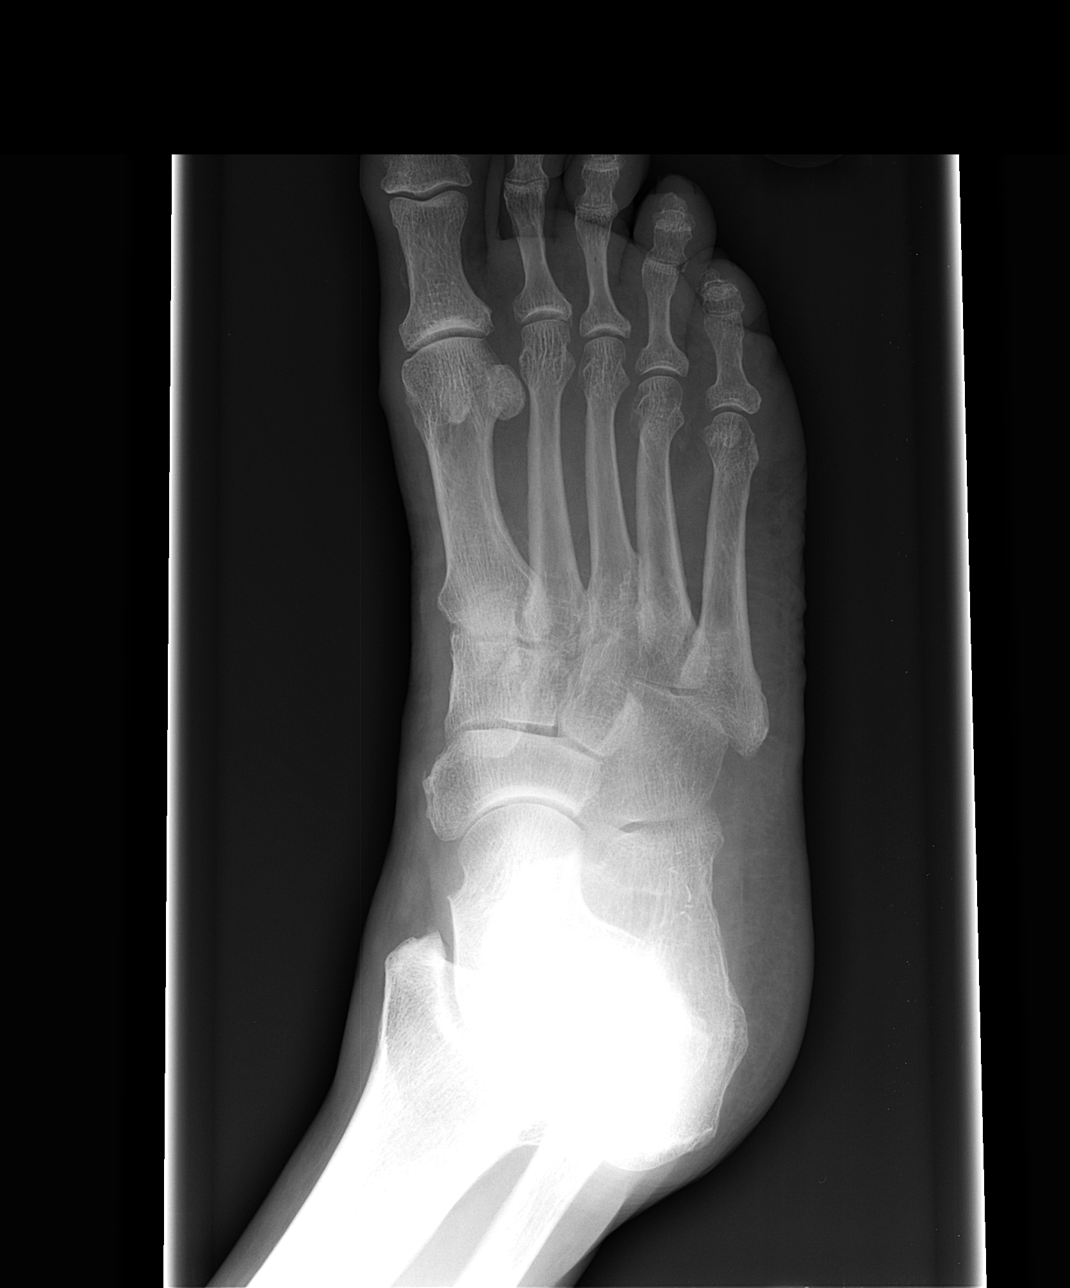

[view not recorded (3 of 3)]
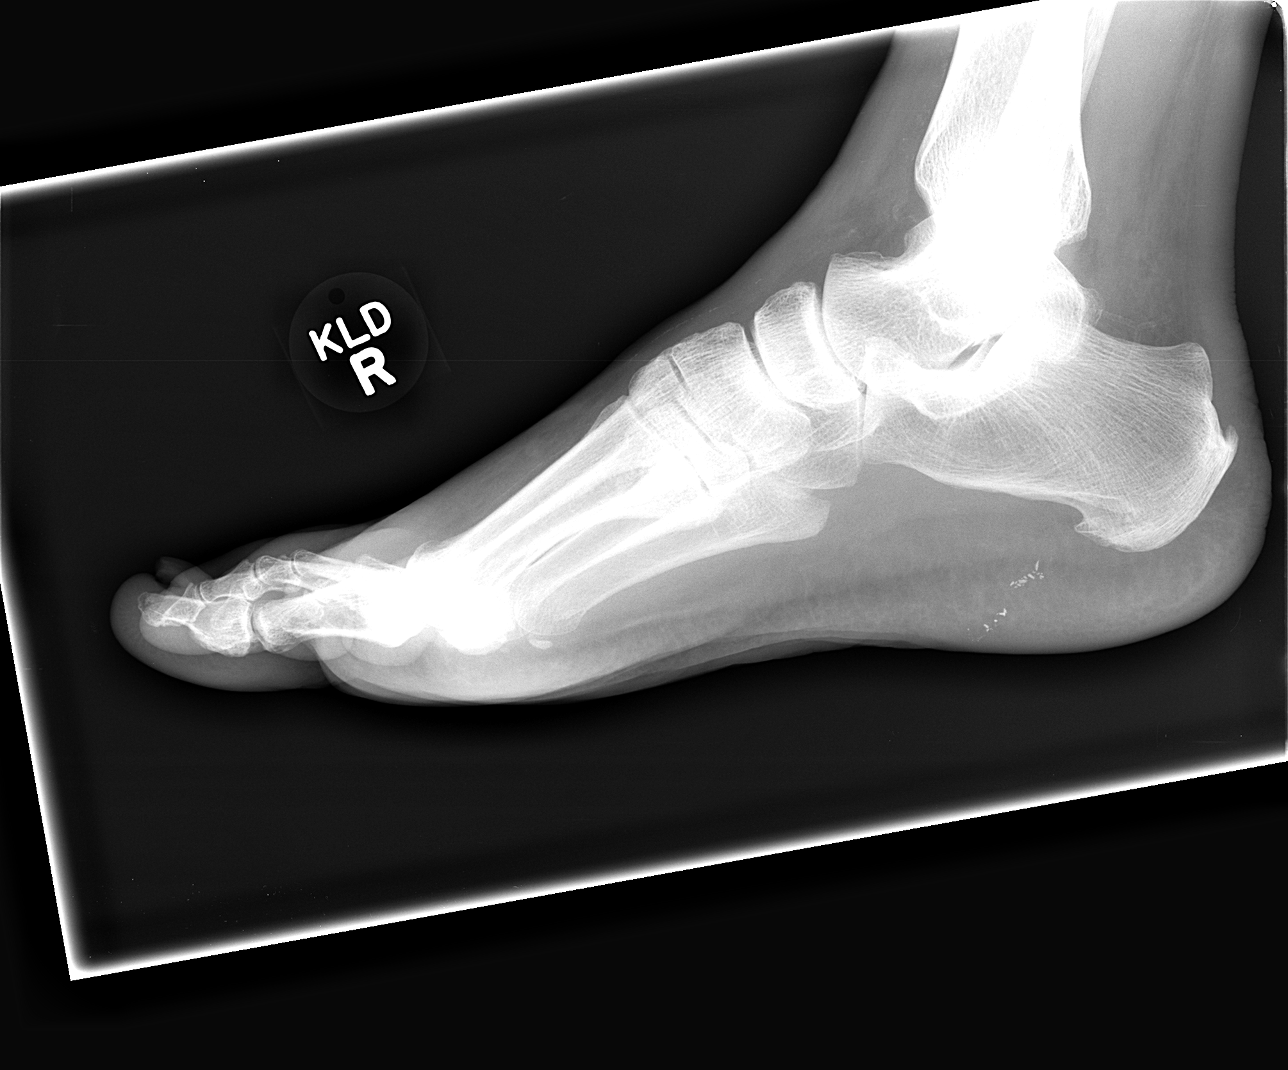

[3 of 3 positions shown; findings below may reference images not displayed]

FINDINGS: There is no evidence of fracture or dislocation. Joint spaces are
intact. Mild spurring of posterior calcaneus is noted. Multiple
small densities are seen in the plantar soft tissues underneath the
calcaneus.
IMPRESSION: No areas of lytic destruction or acute fracture are noted. Multiple
small densities are seen in the plantar soft tissues posteriorly
concerning for foreign bodies.

## 2015-07-20 ENCOUNTER — Other Ambulatory Visit: Payer: Self-pay | Admitting: Family Medicine

## 2015-07-23 ENCOUNTER — Telehealth: Payer: Self-pay | Admitting: Cardiology

## 2015-07-23 ENCOUNTER — Ambulatory Visit (INDEPENDENT_AMBULATORY_CARE_PROVIDER_SITE_OTHER): Payer: Medicare Other | Admitting: *Deleted

## 2015-07-23 DIAGNOSIS — Z950 Presence of cardiac pacemaker: Secondary | ICD-10-CM | POA: Diagnosis not present

## 2015-07-23 DIAGNOSIS — R001 Bradycardia, unspecified: Secondary | ICD-10-CM | POA: Diagnosis not present

## 2015-07-23 NOTE — Telephone Encounter (Signed)
Confirmed remote transmission w/ pt daughter.   

## 2015-07-23 NOTE — Progress Notes (Signed)
Remote pacemaker transmission.   

## 2015-07-24 LAB — CUP PACEART REMOTE DEVICE CHECK
Battery Impedance: 1043 Ohm
Brady Statistic AP VP Percent: 1 %
Brady Statistic AP VS Percent: 77 %
Brady Statistic AS VS Percent: 22 %
Implantable Lead Implant Date: 20100520
Implantable Lead Location: 753859
Implantable Lead Model: 5076
Lead Channel Impedance Value: 552 Ohm
Lead Channel Pacing Threshold Amplitude: 0.5 V
Lead Channel Sensing Intrinsic Amplitude: 2.8 mV
Lead Channel Sensing Intrinsic Amplitude: 5.6 mV
Lead Channel Setting Pacing Pulse Width: 0.4 ms
MDC IDC LEAD IMPLANT DT: 20100520
MDC IDC LEAD LOCATION: 753860
MDC IDC MSMT BATTERY REMAINING LONGEVITY: 61 mo
MDC IDC MSMT BATTERY VOLTAGE: 2.78 V
MDC IDC MSMT LEADCHNL RA IMPEDANCE VALUE: 463 Ohm
MDC IDC MSMT LEADCHNL RA PACING THRESHOLD PULSEWIDTH: 0.4 ms
MDC IDC MSMT LEADCHNL RV PACING THRESHOLD AMPLITUDE: 2 V
MDC IDC MSMT LEADCHNL RV PACING THRESHOLD PULSEWIDTH: 0.4 ms
MDC IDC SESS DTM: 20170619181327
MDC IDC SET LEADCHNL RA PACING AMPLITUDE: 2 V
MDC IDC SET LEADCHNL RV PACING AMPLITUDE: 4 V
MDC IDC SET LEADCHNL RV SENSING SENSITIVITY: 2 mV
MDC IDC STAT BRADY AS VP PERCENT: 0 %

## 2015-07-24 NOTE — Telephone Encounter (Signed)
Refill appropriate and filled per protocol. 

## 2015-07-27 ENCOUNTER — Encounter: Payer: Self-pay | Admitting: Cardiology

## 2015-08-18 ENCOUNTER — Other Ambulatory Visit: Payer: Self-pay | Admitting: Family Medicine

## 2015-08-20 NOTE — Telephone Encounter (Signed)
Refill appropriate and filled per protocol. 

## 2015-09-18 ENCOUNTER — Other Ambulatory Visit: Payer: Self-pay | Admitting: Family Medicine

## 2015-09-25 ENCOUNTER — Telehealth: Payer: Self-pay | Admitting: Family Medicine

## 2015-09-25 NOTE — Telephone Encounter (Signed)
Pt being seen and treated at [facility] at interventional radiology.  Calling to get MCD NPI #,  Given NPI # with 6 visits for 6 months.

## 2015-09-28 DIAGNOSIS — C220 Liver cell carcinoma: Secondary | ICD-10-CM | POA: Diagnosis not present

## 2015-10-03 ENCOUNTER — Other Ambulatory Visit: Payer: Self-pay

## 2015-10-04 DIAGNOSIS — C220 Liver cell carcinoma: Secondary | ICD-10-CM | POA: Diagnosis not present

## 2015-10-18 ENCOUNTER — Ambulatory Visit: Payer: Medicare Other | Admitting: Family Medicine

## 2015-10-20 ENCOUNTER — Other Ambulatory Visit: Payer: Self-pay | Admitting: Family Medicine

## 2015-10-22 ENCOUNTER — Ambulatory Visit (INDEPENDENT_AMBULATORY_CARE_PROVIDER_SITE_OTHER): Payer: Medicare Other | Admitting: *Deleted

## 2015-10-22 DIAGNOSIS — R001 Bradycardia, unspecified: Secondary | ICD-10-CM

## 2015-10-22 NOTE — Progress Notes (Signed)
Remote pacemaker transmission.   

## 2015-10-23 ENCOUNTER — Ambulatory Visit (INDEPENDENT_AMBULATORY_CARE_PROVIDER_SITE_OTHER): Payer: Medicare Other | Admitting: Family Medicine

## 2015-10-23 VITALS — BP 110/68 | HR 76 | Temp 97.9°F | Resp 14 | Ht 70.0 in | Wt 186.0 lb

## 2015-10-23 DIAGNOSIS — Z842 Family history of other diseases of the genitourinary system: Secondary | ICD-10-CM

## 2015-10-23 DIAGNOSIS — I2510 Atherosclerotic heart disease of native coronary artery without angina pectoris: Secondary | ICD-10-CM

## 2015-10-23 MED ORDER — FINASTERIDE 5 MG PO TABS: 5.0000 mg | ORAL_TABLET | Freq: Every day | ORAL | 3 refills | Status: DC
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
## 2015-10-24 ENCOUNTER — Encounter: Payer: Self-pay | Admitting: Cardiology

## 2015-10-24 LAB — PSA: PSA: 1.7 ng/mL (ref <=4.0)

## 2015-10-29 ENCOUNTER — Ambulatory Visit (INDEPENDENT_AMBULATORY_CARE_PROVIDER_SITE_OTHER): Payer: Medicare Other | Admitting: Family Medicine

## 2015-10-29 ENCOUNTER — Encounter: Payer: Self-pay | Admitting: Family Medicine

## 2015-10-29 VITALS — BP 140/68 | HR 82 | Temp 97.7°F | Resp 16 | Ht 70.0 in | Wt 190.0 lb

## 2015-10-29 DIAGNOSIS — I2510 Atherosclerotic heart disease of native coronary artery without angina pectoris: Secondary | ICD-10-CM | POA: Diagnosis not present

## 2015-10-29 DIAGNOSIS — L853 Xerosis cutis: Secondary | ICD-10-CM | POA: Diagnosis not present

## 2015-10-29 DIAGNOSIS — Z23 Encounter for immunization: Secondary | ICD-10-CM | POA: Diagnosis not present

## 2015-10-29 DIAGNOSIS — B353 Tinea pedis: Secondary | ICD-10-CM

## 2015-10-29 DIAGNOSIS — E1149 Type 2 diabetes mellitus with other diabetic neurological complication: Secondary | ICD-10-CM

## 2015-10-29 NOTE — Assessment & Plan Note (Signed)
On oral meds, goal A1C 7% recheck A1C today Tinea pedis- treat with clotrimazole to foot  Cracked heel- can continue cleaning and use of antibiotic ointment no sign of superinfection, vaseline/petroleum to keep skin hydrated.

## 2015-10-29 NOTE — Progress Notes (Signed)
   Subjective:    Patient ID: [name] [name], male    DOB: [birth year], 75 y.o.   MRN: [id number]  Patient presents for Foot Pain (cracked skin on L inner heel)  Pt  here with cracked skin on his left heel. He is a diabetic. He states on Friday he noticed the pain when he was walking he didn't notice a split in his dry feet. He has been using peroxide to wash and use topical antibiotic ointment states that it is healing up nicely has not had any discharge no redness. He also has some redness in between his toes.  He states that his blood sugars have been good his last A1c was 8.1% back in May he was started on Jardiance, in addition to his metformin and glipizide    Review Of Systems:  GEN- denies fatigue, fever, weight loss,weakness, recent illness HEENT- denies eye drainage, change in vision, nasal discharge, CVS- denies chest pain, palpitations RESP- denies SOB, cough, wheeze MSK- denies joint pain, muscle aches, injury Neuro- denies headache, dizziness, syncope, seizure activity       Objective:    BP 140/68 (BP Location: Right Arm, Patient Position: Sitting, Cuff Size: Normal)   Pulse 82   Temp 97.7 F (36.5 C) (Oral)   Resp 16   Ht 5\' 10"  (1.778 m)   Wt 190 lb (86.2 kg)   BMI 27.26 kg/m  GEN- NAD, alert and oriented x3 Ext- no edema Skin- left heel small 1inch crack, mild TTP, dry flaky skin on soles bilat, mild erythema and peeling skin between 4th and 5th digits bilat  Callus left heel        Assessment & Plan:      Problem List Items Addressed This Visit    Type II diabetes mellitus with neurological manifestations (HCC)    On oral meds, goal A1C 7% recheck A1C today Tinea pedis- treat with clotrimazole to foot  Cracked heel- can continue cleaning and use of antibiotic ointment no sign of superinfection, vaseline/petroleum to keep skin hydrated.       Relevant Orders   Comprehensive metabolic panel   Hemoglobin A1c    Other Visit Diagnoses    Tinea pedis  of both feet    -  Primary   Dry skin          Note: This dictation was prepared with Dragon dictation along with smaller phrase technology. Any transcriptional errors that result from this process are unintentional.

## 2015-10-30 LAB — COMPREHENSIVE METABOLIC PANEL
ALK PHOS: 79 U/L (ref 40–115)
ALT: 21 U/L (ref 9–46)
AST: 19 U/L (ref 10–35)
Albumin: 4.3 g/dL (ref 3.6–5.1)
BILIRUBIN TOTAL: 0.5 mg/dL (ref 0.2–1.2)
BUN: 17 mg/dL (ref 7–25)
CO2: 28 mmol/L (ref 20–31)
Calcium: 10 mg/dL (ref 8.6–10.3)
Chloride: 102 mmol/L (ref 98–110)
Creat: 0.91 mg/dL (ref 0.70–1.18)
GLUCOSE: 135 mg/dL — AB (ref 70–99)
Potassium: 4.9 mmol/L (ref 3.5–5.3)
Sodium: 137 mmol/L (ref 135–146)
TOTAL PROTEIN: 7 g/dL (ref 6.1–8.1)

## 2015-10-30 LAB — HEMOGLOBIN A1C
HEMOGLOBIN A1C: 6.9 % — AB (ref <5.7)
Mean Plasma Glucose: 151 mg/dL

## 2015-10-30 MED ORDER — CLOTRIMAZOLE 1 % EX CREA: 1.0000 "application " | TOPICAL_CREAM | Freq: Two times a day (BID) | CUTANEOUS | 0 refills | Status: DC

## 2015-10-30 NOTE — Addendum Note (Signed)
Addended by: Vic Blackbird F on: 10/30/2015 09:27 AM   Modules accepted: Orders

## 2015-11-09 LAB — CUP PACEART REMOTE DEVICE CHECK
Battery Impedance: 1040 Ohm
Battery Remaining Longevity: 59 mo
Brady Statistic AP VP Percent: 0 %
Brady Statistic AS VS Percent: 17 %
Implantable Lead Implant Date: 20100520
Implantable Lead Location: 753860
Implantable Lead Model: 5076
Lead Channel Impedance Value: 458 Ohm
Lead Channel Pacing Threshold Amplitude: 0.5 V
Lead Channel Pacing Threshold Pulse Width: 0.4 ms
Lead Channel Sensing Intrinsic Amplitude: 5.6 mV
Lead Channel Setting Pacing Pulse Width: 0.4 ms
MDC IDC LEAD IMPLANT DT: 20100520
MDC IDC LEAD LOCATION: 753859
MDC IDC MSMT BATTERY VOLTAGE: 2.78 V
MDC IDC MSMT LEADCHNL RV IMPEDANCE VALUE: 617 Ohm
MDC IDC MSMT LEADCHNL RV PACING THRESHOLD AMPLITUDE: 1.625 V
MDC IDC MSMT LEADCHNL RV PACING THRESHOLD PULSEWIDTH: 0.4 ms
MDC IDC SESS DTM: 20170918131012
MDC IDC SET LEADCHNL RA PACING AMPLITUDE: 2 V
MDC IDC SET LEADCHNL RV PACING AMPLITUDE: 3.25 V
MDC IDC SET LEADCHNL RV SENSING SENSITIVITY: 2 mV
MDC IDC STAT BRADY AP VS PERCENT: 82 %
MDC IDC STAT BRADY AS VP PERCENT: 0 %

## 2015-12-24 ENCOUNTER — Other Ambulatory Visit: Payer: Self-pay | Admitting: Family Medicine

## 2016-01-19 ENCOUNTER — Other Ambulatory Visit: Payer: Self-pay | Admitting: Family Medicine

## 2016-01-21 ENCOUNTER — Other Ambulatory Visit: Payer: Self-pay | Admitting: Family Medicine

## 2016-02-07 ENCOUNTER — Encounter: Payer: Self-pay | Admitting: Internal Medicine

## 2016-02-07 ENCOUNTER — Ambulatory Visit (INDEPENDENT_AMBULATORY_CARE_PROVIDER_SITE_OTHER): Payer: Medicare Other | Admitting: Internal Medicine

## 2016-02-07 VITALS — BP 106/60 | HR 76 | Ht 70.0 in | Wt 189.0 lb

## 2016-02-07 DIAGNOSIS — R001 Bradycardia, unspecified: Secondary | ICD-10-CM | POA: Diagnosis not present

## 2016-02-07 LAB — CUP PACEART INCLINIC DEVICE CHECK
Battery Remaining Longevity: 56 mo
Brady Statistic AP VP Percent: 1 %
Brady Statistic AP VS Percent: 83 %
Brady Statistic AS VP Percent: 0 %
Brady Statistic AS VS Percent: 16 %
Implantable Lead Implant Date: 20100520
Implantable Lead Location: 753860
Implantable Lead Model: 5076
Implantable Pulse Generator Implant Date: 20100520
Lead Channel Impedance Value: 439 Ohm
Lead Channel Impedance Value: 611 Ohm
Lead Channel Pacing Threshold Amplitude: 1.25 V
Lead Channel Pacing Threshold Amplitude: 1.5 V
Lead Channel Pacing Threshold Pulse Width: 0.4 ms
Lead Channel Pacing Threshold Pulse Width: 0.64 ms
Lead Channel Sensing Intrinsic Amplitude: 4 mV
Lead Channel Sensing Intrinsic Amplitude: 5.6 mV
Lead Channel Setting Pacing Amplitude: 2 V
Lead Channel Setting Pacing Amplitude: 2.5 V
Lead Channel Setting Sensing Sensitivity: 2.8 mV
MDC IDC LEAD IMPLANT DT: 20100520
MDC IDC LEAD LOCATION: 753859
MDC IDC MSMT BATTERY IMPEDANCE: 1119 Ohm
MDC IDC MSMT BATTERY VOLTAGE: 2.77 V
MDC IDC MSMT LEADCHNL RA PACING THRESHOLD AMPLITUDE: 0.5 V
MDC IDC MSMT LEADCHNL RA PACING THRESHOLD AMPLITUDE: 0.5 V
MDC IDC MSMT LEADCHNL RA PACING THRESHOLD PULSEWIDTH: 0.4 ms
MDC IDC MSMT LEADCHNL RA PACING THRESHOLD PULSEWIDTH: 0.4 ms
MDC IDC SESS DTM: 20180104104006
MDC IDC SET LEADCHNL RV PACING PULSEWIDTH: 0.64 ms

## 2016-02-07 MED ORDER — SILDENAFIL CITRATE 20 MG PO TABS: ORAL_TABLET | ORAL | 2 refills | Status: DC
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
## 2016-02-07 NOTE — Patient Instructions (Addendum)
Your physician wants you to follow-up in: 1 Year with Dr. [name]. You will receive a reminder letter in the mail two months in advance. If you don't receive a letter, please call our office to schedule the follow-up appointment.  Your physician has recommended you make the following change in your medication:   START Sildenafil 20 mg Take 2-3 Tablets as needed. ( Do Not Start Sildenafil until stopping Imdur for one week)   If Sildenafil does not work you may Restart Imdur one after last dose of Sildenafil  If you need a refill on your cardiac medications before your next appointment, please call your pharmacy.  Thank you for choosing [facility]!

## 2016-03-12 DIAGNOSIS — Z961 Presence of intraocular lens: Secondary | ICD-10-CM | POA: Diagnosis not present

## 2016-03-12 DIAGNOSIS — H52203 Unspecified astigmatism, bilateral: Secondary | ICD-10-CM | POA: Diagnosis not present

## 2016-03-12 DIAGNOSIS — E119 Type 2 diabetes mellitus without complications: Secondary | ICD-10-CM | POA: Diagnosis not present

## 2016-03-12 DIAGNOSIS — H524 Presbyopia: Secondary | ICD-10-CM | POA: Diagnosis not present

## 2016-03-12 DIAGNOSIS — H5203 Hypermetropia, bilateral: Secondary | ICD-10-CM | POA: Diagnosis not present

## 2016-03-13 ENCOUNTER — Observation Stay (HOSPITAL_COMMUNITY)
Admission: EM | Admit: 2016-03-13 | Discharge: 2016-03-14 | Disposition: A | Discharge status: Home / Self Care | Payer: Medicare Other | Attending: Cardiovascular Disease | Admitting: Cardiovascular Disease

## 2016-03-13 ENCOUNTER — Emergency Department (HOSPITAL_COMMUNITY): Payer: Medicare Other

## 2016-03-13 ENCOUNTER — Encounter (HOSPITAL_COMMUNITY): Payer: Self-pay

## 2016-03-13 DIAGNOSIS — R0789 Other chest pain: Secondary | ICD-10-CM

## 2016-03-13 DIAGNOSIS — I25700 Atherosclerosis of coronary artery bypass graft(s), unspecified, with unstable angina pectoris: Secondary | ICD-10-CM

## 2016-03-13 DIAGNOSIS — K7460 Unspecified cirrhosis of liver: Secondary | ICD-10-CM | POA: Insufficient documentation

## 2016-03-13 DIAGNOSIS — B359 Dermatophytosis, unspecified: Secondary | ICD-10-CM | POA: Insufficient documentation

## 2016-03-13 DIAGNOSIS — D638 Anemia in other chronic diseases classified elsewhere: Secondary | ICD-10-CM | POA: Diagnosis not present

## 2016-03-13 DIAGNOSIS — Z7982 Long term (current) use of aspirin: Secondary | ICD-10-CM | POA: Insufficient documentation

## 2016-03-13 DIAGNOSIS — Z79899 Other long term (current) drug therapy: Secondary | ICD-10-CM | POA: Insufficient documentation

## 2016-03-13 DIAGNOSIS — Z8619 Personal history of other infectious and parasitic diseases: Secondary | ICD-10-CM | POA: Diagnosis not present

## 2016-03-13 DIAGNOSIS — I2510 Atherosclerotic heart disease of native coronary artery without angina pectoris: Secondary | ICD-10-CM | POA: Diagnosis not present

## 2016-03-13 DIAGNOSIS — C220 Liver cell carcinoma: Secondary | ICD-10-CM | POA: Diagnosis not present

## 2016-03-13 DIAGNOSIS — R079 Chest pain, unspecified: Principal | ICD-10-CM | POA: Insufficient documentation

## 2016-03-13 DIAGNOSIS — I252 Old myocardial infarction: Secondary | ICD-10-CM | POA: Diagnosis not present

## 2016-03-13 DIAGNOSIS — R061 Stridor: Secondary | ICD-10-CM | POA: Diagnosis not present

## 2016-03-13 DIAGNOSIS — Z794 Long term (current) use of insulin: Secondary | ICD-10-CM | POA: Insufficient documentation

## 2016-03-13 DIAGNOSIS — Z87891 Personal history of nicotine dependence: Secondary | ICD-10-CM | POA: Diagnosis not present

## 2016-03-13 DIAGNOSIS — I480 Paroxysmal atrial fibrillation: Secondary | ICD-10-CM | POA: Diagnosis not present

## 2016-03-13 DIAGNOSIS — B182 Chronic viral hepatitis C: Secondary | ICD-10-CM | POA: Diagnosis not present

## 2016-03-13 DIAGNOSIS — E538 Deficiency of other specified B group vitamins: Secondary | ICD-10-CM | POA: Insufficient documentation

## 2016-03-13 DIAGNOSIS — E785 Hyperlipidemia, unspecified: Secondary | ICD-10-CM | POA: Insufficient documentation

## 2016-03-13 DIAGNOSIS — Z955 Presence of coronary angioplasty implant and graft: Secondary | ICD-10-CM | POA: Diagnosis not present

## 2016-03-13 DIAGNOSIS — E1149 Type 2 diabetes mellitus with other diabetic neurological complication: Secondary | ICD-10-CM | POA: Diagnosis not present

## 2016-03-13 DIAGNOSIS — H9313 Tinnitus, bilateral: Secondary | ICD-10-CM | POA: Insufficient documentation

## 2016-03-13 DIAGNOSIS — N400 Enlarged prostate without lower urinary tract symptoms: Secondary | ICD-10-CM | POA: Insufficient documentation

## 2016-03-13 DIAGNOSIS — M1711 Unilateral primary osteoarthritis, right knee: Secondary | ICD-10-CM | POA: Diagnosis not present

## 2016-03-13 DIAGNOSIS — Z9861 Coronary angioplasty status: Secondary | ICD-10-CM

## 2016-03-13 DIAGNOSIS — I25810 Atherosclerosis of coronary artery bypass graft(s) without angina pectoris: Secondary | ICD-10-CM | POA: Diagnosis not present

## 2016-03-13 DIAGNOSIS — I110 Hypertensive heart disease with heart failure: Secondary | ICD-10-CM | POA: Diagnosis not present

## 2016-03-13 DIAGNOSIS — K449 Diaphragmatic hernia without obstruction or gangrene: Secondary | ICD-10-CM | POA: Diagnosis not present

## 2016-03-13 DIAGNOSIS — H9193 Unspecified hearing loss, bilateral: Secondary | ICD-10-CM | POA: Diagnosis not present

## 2016-03-13 DIAGNOSIS — I10 Essential (primary) hypertension: Secondary | ICD-10-CM | POA: Diagnosis present

## 2016-03-13 DIAGNOSIS — E119 Type 2 diabetes mellitus without complications: Secondary | ICD-10-CM

## 2016-03-13 DIAGNOSIS — Z950 Presence of cardiac pacemaker: Secondary | ICD-10-CM

## 2016-03-13 DIAGNOSIS — Z9114 Patient's other noncompliance with medication regimen: Secondary | ICD-10-CM | POA: Insufficient documentation

## 2016-03-13 DIAGNOSIS — K648 Other hemorrhoids: Secondary | ICD-10-CM | POA: Insufficient documentation

## 2016-03-13 LAB — PROTIME-INR
INR: 1.09
PROTHROMBIN TIME: 14.1 s (ref 11.4–15.2)

## 2016-03-13 LAB — I-STAT TROPONIN, ED: TROPONIN I, POC: 0 ng/mL (ref 0.00–0.08)

## 2016-03-13 LAB — CBC
HCT: 38.3 % — ABNORMAL LOW (ref 39.0–52.0)
HCT: 36.8 % — ABNORMAL LOW (ref 39.0–52.0)
Hemoglobin: 12.8 g/dL — ABNORMAL LOW (ref 13.0–17.0)
Hemoglobin: 12.2 g/dL — ABNORMAL LOW (ref 13.0–17.0)
MCH: 30 pg (ref 26.0–34.0)
MCH: 29.8 pg (ref 26.0–34.0)
MCHC: 33.4 g/dL (ref 30.0–36.0)
MCHC: 33.2 g/dL (ref 30.0–36.0)
MCV: 89.9 fL (ref 78.0–100.0)
MCV: 89.8 fL (ref 78.0–100.0)
PLATELETS: 87 10*3/uL — AB (ref 150–400)
Platelets: 86 10*3/uL — ABNORMAL LOW (ref 150–400)
RBC: 4.26 MIL/uL (ref 4.22–5.81)
RBC: 4.1 MIL/uL — AB (ref 4.22–5.81)
RDW: 14 % (ref 11.5–15.5)
RDW: 14.1 % (ref 11.5–15.5)
WBC: 5.6 10*3/uL (ref 4.0–10.5)
WBC: 4.9 10*3/uL (ref 4.0–10.5)

## 2016-03-13 LAB — COMPREHENSIVE METABOLIC PANEL
ALT: 20 U/L (ref 17–63)
AST: 20 U/L (ref 15–41)
Albumin: 3.6 g/dL (ref 3.5–5.0)
Alkaline Phosphatase: 70 U/L (ref 38–126)
Anion gap: 7 (ref 5–15)
BUN: 13 mg/dL (ref 6–20)
CHLORIDE: 104 mmol/L (ref 101–111)
CO2: 27 mmol/L (ref 22–32)
CREATININE: 0.78 mg/dL (ref 0.61–1.24)
Calcium: 9.2 mg/dL (ref 8.9–10.3)
Glucose, Bld: 135 mg/dL — ABNORMAL HIGH (ref 65–99)
POTASSIUM: 4 mmol/L (ref 3.5–5.1)
SODIUM: 138 mmol/L (ref 135–145)
Total Bilirubin: 0.7 mg/dL (ref 0.3–1.2)
Total Protein: 6.3 g/dL — ABNORMAL LOW (ref 6.5–8.1)

## 2016-03-13 LAB — CREATININE, SERUM: Creatinine, Ser: 0.73 mg/dL (ref 0.61–1.24)

## 2016-03-13 LAB — LIPASE, BLOOD: LIPASE: 17 U/L (ref 11–51)

## 2016-03-13 LAB — TROPONIN I

## 2016-03-13 LAB — TSH: TSH: 2.458 u[IU]/mL (ref 0.350–4.500)

## 2016-03-13 MED ORDER — ACETAMINOPHEN 325 MG PO TABS: 650.0000 mg | ORAL_TABLET | ORAL | Status: DC | PRN

## 2016-03-13 MED ORDER — METOPROLOL TARTRATE 25 MG PO TABS
50.0000 mg | ORAL_TABLET | Freq: Two times a day (BID) | ORAL | Status: DC
  Administered 2016-03-13 – 2016-03-14 (×2): 50 mg via ORAL
  Filled 2016-03-13 (×2): qty 2

## 2016-03-13 MED ORDER — ASPIRIN EC 81 MG PO TBEC: 81.0000 mg | DELAYED_RELEASE_TABLET | Freq: Every day | ORAL | Status: DC

## 2016-03-13 MED ORDER — FOLIC ACID 1 MG PO TABS
1.0000 mg | ORAL_TABLET | Freq: Every day | ORAL | Status: DC
  Filled 2016-03-13: qty 1

## 2016-03-13 MED ORDER — HEPARIN SODIUM (PORCINE) 5000 UNIT/ML IJ SOLN
5000.0000 [IU] | Freq: Three times a day (TID) | INTRAMUSCULAR | Status: DC
  Administered 2016-03-13 – 2016-03-14 (×2): 5000 [IU] via SUBCUTANEOUS
  Filled 2016-03-13 (×2): qty 1

## 2016-03-13 MED ORDER — ONDANSETRON HCL 4 MG/2ML IJ SOLN: 4.0000 mg | Freq: Four times a day (QID) | INTRAMUSCULAR | Status: DC | PRN

## 2016-03-13 MED ORDER — LISINOPRIL 2.5 MG PO TABS
2.5000 mg | ORAL_TABLET | Freq: Every day | ORAL | Status: DC
  Administered 2016-03-13 – 2016-03-14 (×2): 2.5 mg via ORAL
  Filled 2016-03-13 (×2): qty 1

## 2016-03-13 MED ORDER — GLIPIZIDE 5 MG PO TABS
5.0000 mg | ORAL_TABLET | Freq: Every day | ORAL | Status: DC
  Filled 2016-03-13: qty 1

## 2016-03-13 MED ORDER — ATORVASTATIN CALCIUM 20 MG PO TABS
20.0000 mg | ORAL_TABLET | Freq: Every day | ORAL | Status: DC
  Administered 2016-03-13: 20 mg via ORAL
  Filled 2016-03-13: qty 1

## 2016-03-13 MED ORDER — PANTOPRAZOLE SODIUM 40 MG PO TBEC
40.0000 mg | DELAYED_RELEASE_TABLET | Freq: Every day | ORAL | Status: DC
  Administered 2016-03-14: 40 mg via ORAL
  Filled 2016-03-13: qty 1

## 2016-03-13 MED ORDER — TERAZOSIN HCL 5 MG PO CAPS
5.0000 mg | ORAL_CAPSULE | Freq: Every evening | ORAL | Status: DC
  Administered 2016-03-13: 5 mg via ORAL
  Filled 2016-03-13: qty 1

## 2016-03-13 MED ORDER — NITROGLYCERIN 0.4 MG SL SUBL: 0.4000 mg | SUBLINGUAL_TABLET | SUBLINGUAL | Status: DC | PRN

## 2016-03-13 MED ORDER — FERROUS SULFATE 325 (65 FE) MG PO TABS
325.0000 mg | ORAL_TABLET | Freq: Two times a day (BID) | ORAL | Status: DC
  Administered 2016-03-13 – 2016-03-14 (×2): 325 mg via ORAL
  Filled 2016-03-13 (×2): qty 1

## 2016-03-13 MED ORDER — SODIUM CHLORIDE 0.9% FLUSH
3.0000 mL | Freq: Two times a day (BID) | INTRAVENOUS | Status: DC
  Administered 2016-03-13 – 2016-03-14 (×2): 3 mL via INTRAVENOUS

## 2016-03-13 MED ORDER — ASPIRIN EC 81 MG PO TBEC
81.0000 mg | DELAYED_RELEASE_TABLET | Freq: Every day | ORAL | Status: DC
  Administered 2016-03-14: 81 mg via ORAL
  Filled 2016-03-13 (×2): qty 1

## 2016-03-13 MED ORDER — SODIUM CHLORIDE 0.9 % IV SOLN: 250.0000 mL | INTRAVENOUS | Status: DC | PRN

## 2016-03-13 MED ORDER — CANAGLIFLOZIN 100 MG PO TABS
100.0000 mg | ORAL_TABLET | Freq: Every day | ORAL | Status: DC
  Filled 2016-03-13: qty 1

## 2016-03-13 MED ORDER — SODIUM CHLORIDE 0.9% FLUSH: 3.0000 mL | INTRAVENOUS | Status: DC | PRN

## 2016-03-13 MED ORDER — ISOSORBIDE MONONITRATE ER 30 MG PO TB24
30.0000 mg | ORAL_TABLET | Freq: Every day | ORAL | Status: DC
  Administered 2016-03-13 – 2016-03-14 (×2): 30 mg via ORAL
  Filled 2016-03-13 (×2): qty 1

## 2016-03-13 NOTE — ED Notes (Signed)
Attempted report x1. 

## 2016-03-13 NOTE — ED Notes (Signed)
Pacemaker interrogated at the bedside

## 2016-03-13 NOTE — ED Notes (Signed)
Pt. Given a bag lunch, crackers, sprite and peanut butter
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
## 2016-03-13 NOTE — ED Triage Notes (Signed)
Pt brought in by EMS due to having chest pain this morning. Pt describes pain as uncomfortable, radiating down left and into left jaw. Pt is pain free at this time. Pt received 324mg  of aspirin enroute. Pt has PPM. Pt a&ox4.

## 2016-03-13 NOTE — ED Notes (Signed)
Pt. oob to the bathroom, gait steady.  Pt. Denies any pain or discomfort.

## 2016-03-14 ENCOUNTER — Other Ambulatory Visit: Payer: Self-pay | Admitting: Cardiology

## 2016-03-14 DIAGNOSIS — Z8619 Personal history of other infectious and parasitic diseases: Secondary | ICD-10-CM | POA: Diagnosis not present

## 2016-03-14 DIAGNOSIS — E1149 Type 2 diabetes mellitus with other diabetic neurological complication: Secondary | ICD-10-CM | POA: Diagnosis not present

## 2016-03-14 DIAGNOSIS — I252 Old myocardial infarction: Secondary | ICD-10-CM | POA: Diagnosis not present

## 2016-03-14 DIAGNOSIS — H9193 Unspecified hearing loss, bilateral: Secondary | ICD-10-CM | POA: Diagnosis not present

## 2016-03-14 DIAGNOSIS — B182 Chronic viral hepatitis C: Secondary | ICD-10-CM | POA: Diagnosis not present

## 2016-03-14 DIAGNOSIS — Z955 Presence of coronary angioplasty implant and graft: Secondary | ICD-10-CM | POA: Diagnosis not present

## 2016-03-14 DIAGNOSIS — I2510 Atherosclerotic heart disease of native coronary artery without angina pectoris: Secondary | ICD-10-CM | POA: Diagnosis not present

## 2016-03-14 DIAGNOSIS — R0789 Other chest pain: Secondary | ICD-10-CM | POA: Diagnosis not present

## 2016-03-14 DIAGNOSIS — N400 Enlarged prostate without lower urinary tract symptoms: Secondary | ICD-10-CM | POA: Diagnosis not present

## 2016-03-14 DIAGNOSIS — R079 Chest pain, unspecified: Secondary | ICD-10-CM | POA: Diagnosis not present

## 2016-03-14 DIAGNOSIS — I25700 Atherosclerosis of coronary artery bypass graft(s), unspecified, with unstable angina pectoris: Secondary | ICD-10-CM | POA: Diagnosis not present

## 2016-03-14 DIAGNOSIS — E785 Hyperlipidemia, unspecified: Secondary | ICD-10-CM | POA: Diagnosis not present

## 2016-03-14 DIAGNOSIS — C220 Liver cell carcinoma: Secondary | ICD-10-CM | POA: Diagnosis not present

## 2016-03-14 DIAGNOSIS — Z950 Presence of cardiac pacemaker: Secondary | ICD-10-CM | POA: Diagnosis not present

## 2016-03-14 LAB — CBC
HCT: 40 % (ref 39.0–52.0)
HEMOGLOBIN: 13.3 g/dL (ref 13.0–17.0)
MCH: 30.1 pg (ref 26.0–34.0)
MCHC: 33.3 g/dL (ref 30.0–36.0)
MCV: 90.5 fL (ref 78.0–100.0)
PLATELETS: 85 10*3/uL — AB (ref 150–400)
RBC: 4.42 MIL/uL (ref 4.22–5.81)
RDW: 14.1 % (ref 11.5–15.5)
WBC: 5.1 10*3/uL (ref 4.0–10.5)

## 2016-03-14 LAB — PROTIME-INR
INR: 1
PROTHROMBIN TIME: 13.2 s (ref 11.4–15.2)

## 2016-03-14 LAB — COMPREHENSIVE METABOLIC PANEL
ALT: 18 U/L (ref 17–63)
ANION GAP: 11 (ref 5–15)
AST: 20 U/L (ref 15–41)
Albumin: 3.9 g/dL (ref 3.5–5.0)
Alkaline Phosphatase: 78 U/L (ref 38–126)
BILIRUBIN TOTAL: 0.8 mg/dL (ref 0.3–1.2)
BUN: 13 mg/dL (ref 6–20)
CHLORIDE: 102 mmol/L (ref 101–111)
CO2: 25 mmol/L (ref 22–32)
Calcium: 9.5 mg/dL (ref 8.9–10.3)
Creatinine, Ser: 0.74 mg/dL (ref 0.61–1.24)
Glucose, Bld: 147 mg/dL — ABNORMAL HIGH (ref 65–99)
POTASSIUM: 4.4 mmol/L (ref 3.5–5.1)
Sodium: 138 mmol/L (ref 135–145)
TOTAL PROTEIN: 6.7 g/dL (ref 6.5–8.1)

## 2016-03-14 LAB — LIPID PANEL
CHOLESTEROL: 137 mg/dL (ref 0–200)
HDL: 40 mg/dL — ABNORMAL LOW (ref >40)
LDL CALC: 70 mg/dL (ref 0–99)
Total CHOL/HDL Ratio: 3.4 RATIO
Triglycerides: 136 mg/dL (ref <150)
VLDL: 27 mg/dL (ref 0–40)

## 2016-03-14 LAB — TROPONIN I

## 2016-03-14 MED ORDER — ISOSORBIDE MONONITRATE ER 30 MG PO TB24: 30.0000 mg | ORAL_TABLET | ORAL | 6 refills | Status: DC

## 2016-03-14 MED ORDER — NITROGLYCERIN 0.4 MG SL SUBL: 0.4000 mg | SUBLINGUAL_TABLET | SUBLINGUAL | 4 refills | Status: DC | PRN

## 2016-03-14 MED ORDER — METOPROLOL TARTRATE 50 MG PO TABS: 50.0000 mg | ORAL_TABLET | Freq: Two times a day (BID) | ORAL | 6 refills | Status: DC

## 2016-03-14 MED ORDER — OMEPRAZOLE 20 MG PO CPDR: 20.0000 mg | DELAYED_RELEASE_CAPSULE | Freq: Two times a day (BID) | ORAL | 6 refills | Status: DC

## 2016-03-14 NOTE — Discharge Summary (Signed)
Physician Discharge Summary       Patient ID: [name] [name] MRN: [id number] DOB/AGE: 1940/05/10 76 y.o.  Admit date: 03/13/2016 Discharge date: 03/14/2016 Primary Cardiologist: Dr. Jacinta Shoe Dr. [name]   Discharge Diagnoses:  Principal Problem:   Chest pain, negative MI, meds adjusted Active Problems:   HLD (hyperlipidemia)   HTN (hypertension)   CAD S/P percutaneous coronary angioplasty   Diabetes mellitus ([place])   Pacemaker   BPH (benign prostatic hyperplasia)   CAD (coronary artery disease) of artery bypass graft   Type II diabetes mellitus with neurological manifestations ([place])   Hepatocellular carcinoma ([place])   Discharged Condition: good  Procedures: none  Hospital Course: 76 y.o. 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  He apparently had stenting to LCX in 2009 while incarcerated in [place]. He had another cath in 2014 for recurrent angina and abnormal myoview in 2014 that showed patent LCx stent, borderline LAD lesion (0.85 FFR), severe mid diag stenosis (too small for PCI) and he was continued on medical therapy.   He is followed by Dr [name] for his pacemaker. Some PAF (He has had 3 hours of atrial fib on 2 occassions) was noted on device interrogation but it was decided to not anticoagulate him with a history of hematuria, non compliance and need for ASA for CAD.   On day of admit while eating BK he had sudden onset of chest burning with; radiation to Lt arm.  No associated symptoms of SOB or diaphoresis.  He also felt like his heart was racing.  EMS was called and symptoms resolved prior to EMS arrival.    Pt seen and admitted, with addition of Imdur. Troponins have been negative.   His pacer was  interrogated and he did have PAF.  Rate is not fast.    Today pt seen and evaluated by Dr. Acie Fredrickson.  Neg. MI, pt ambulated without problems.  LABS as below.  Plan for outpt lexiscan myoivew and then follow up with Dr. Jacinta Shoe.  We increased his imdur to 60 mg BID and priolsec to BID.  These may be adjusted after stress test.  Pt and family without questions.    Consults: None  Significant Diagnostic Studies:  BMP Latest Ref Rng & Units 03/14/2016 03/13/2016 03/13/2016  Glucose 65 - 99 mg/dL 147(H) - 135(H)  BUN 6 - 20 mg/dL 13 - 13  Creatinine 0.61 - 1.24 mg/dL 0.74 0.73 0.78  Sodium 135 - 145 mmol/L 138 - 138  Potassium 3.5 - 5.1 mmol/L 4.4 - 4.0  Chloride 101 - 111 mmol/L 102 - 104  CO2 22 - 32 mmol/L 25 - 27  Calcium 8.9 - 10.3 mg/dL 9.5 - 9.2   CBC Latest Ref Rng & Units 03/14/2016 03/13/2016 03/13/2016  WBC 4.0 - 10.5 K/uL 5.1 4.9 5.6  Hemoglobin 13.0 - 17.0 g/dL 13.3 12.2(L) 12.8(L)  Hematocrit 39.0 - 52.0 % 40.0 36.8(L) 38.3(L)  Platelets 150 - 400 K/uL 85(L) 86(L) 87(L)   Troponin < 0.03  X 3.    Lipid Panel     Component Value Date/Time   CHOL 137 03/14/2016 0521   TRIG 136 03/14/2016 0521   HDL 40 (L) 03/14/2016 0521   CHOLHDL 3.4 03/14/2016 [id number]  VLDL 27 03/14/2016 0521   LDLCALC 70 03/14/2016 0521    TSH 2.458  CHEST  2 VIEW COMPARISON:  01/09/2013 FINDINGS: Prior CABG. Heart is normal size. Areas of bibasilar scarring. No acute airspace opacities or effusions. No acute bony abnormality. IMPRESSION: No active cardiopulmonary disease.   Discharge Exam: Blood pressure (!) 143/72, pulse 90, temperature 97.5 F (36.4 C), temperature source Oral, resp. rate 18, height 5\' 10"  (1.778 m), weight 182 lb 14.4 oz (83 kg), SpO2 98 %.  Disposition: 01-Home or Self Care   Allergies as of 03/14/2016   No Known Allergies     Medication List    STOP taking these medications   sildenafil 20 MG tablet Commonly known as:  REVATIO     TAKE these medications     aspirin 81 MG tablet Take 81 mg by mouth daily.   atorvastatin 20 MG tablet Commonly known as:  LIPITOR TAKE ONE TABLET BY MOUTH ONCE DAILY.   clotrimazole 1 % cream Commonly known as:  LOTRIMIN Apply 1 application topically 2 (two) times daily.   clotrimazole-betamethasone cream Commonly known as:  LOTRISONE Apply 1 application topically 2 (two) times daily.   ferrous sulfate 325 (65 FE) MG tablet Take 325 mg by mouth 2 (two) times daily with a meal.   folic acid 1 MG tablet Commonly known as:  FOLVITE TAKE ONE TABLET BY MOUTH ONCE DAILY.   glipiZIDE 5 MG tablet Commonly known as:  GLUCOTROL TAKE ONE TABLET BY MOUTH ONCE DAILY BEFORE BREAKFAST.   glucose blood test strip 1 each by Other route daily. Dispense per insurance and patient preference.   isosorbide mononitrate 30 MG 24 hr tablet Commonly known as:  IMDUR Take 1-2 tablets (30-60 mg total) by mouth See admin instructions. 60 mg in the morning and 60 mg at supper(time) What changed:  additional instructions   JARDIANCE 25 MG Tabs tablet Generic drug:  empagliflozin TAKE ONE TABLET BY MOUTH ONCE DAILY.   Lancets 30G Misc 1 each by Does not apply route daily. Dispense per insurance and patient preference   lisinopril 2.5 MG tablet Commonly known as:  PRINIVIL,ZESTRIL TAKE ONE TABLET BY MOUTH DAILY.   metFORMIN 500 MG tablet Commonly known as:  GLUCOPHAGE TAKE 1 TABLET BY MOUTH TWICE DAILY WITH A MEAL.   metoprolol 50 MG tablet Commonly known as:  LOPRESSOR Take 1 tablet (50 mg total) by mouth 2 (two) times daily. What changed:  See the new instructions.   nitroGLYCERIN 0.4 MG SL tablet Commonly known as:  NITROSTAT Place 1 tablet (0.4 mg total) under the tongue every 5 (five) minutes x 3 doses as needed for chest pain.   omeprazole 20 MG capsule Commonly known as:  PRILOSEC Take 1 capsule (20 mg total) by mouth 2 (two) times daily before a meal. What changed:  See the new instructions.    terazosin 5 MG capsule Commonly known as:  HYTRIN TAKE 1 CAPSULE BY MOUTH EVERY EVENING.      Follow-up Information    Kate Sable, MD Follow up.   Specialty:  Cardiology Why:  the office will call with date and time. Contact information: [place] [id number] [phone]            Discharge Instructions: The office will arrange a stress test as an outpt.  They will call you with date and time.  Heart healthy diabetic diet.  Do not take sildenafil anymore until cleared by Dr. Jacinta Shoe.  You should  not take within 72 hours of NTG or IMDUR.     Signed: Cecilie Kicks Nurse Practitioner-Certified [facility]: [facility] 03/14/2016, 12:52 PM  Time spent on discharge : > 30 minutes.  Attending Note:   The patient was seen and examined.  Agree with assessment and plan as noted above.  Changes made to the above note as needed.  Patient seen and independently examined with Cecilie Kicks, NP.   We discussed all aspects of the encounter. I agree with the assessment and plan as stated above.  1. Chest pain :  Seems somewhat atypical. He has known CAD . Will arrange for an OP stress myoview.   I have spent a total of 40 minutes with patient reviewing hospital  notes , telemetry, EKGs, labs and examining patient as well as establishing an assessment and plan that was discussed with the patient. > 50% of time was spent in direct patient care.    Thayer Headings, Brooke Bonito., MD, [facility] 03/16/2016, 4:34 PM [id number] [address]  [place] Pager [phone]

## 2016-03-14 NOTE — Progress Notes (Signed)
   03/14/16 1330  Clinical Encounter Type  Visited With Patient and family together  Visit Type Follow-up  Spiritual Encounters  Spiritual Needs Emotional  Stress Factors  Patient Stress Factors None identified  Family Stress Factors None identified  Introduction to Pt's daughter. Pt getting discharged and stated no need for AD at this time.

## 2016-03-14 NOTE — Progress Notes (Signed)
Discharge instructions printed and reviewed with patient and wife, and copy given for them to take home. All questions addressed at this time. New prescriptions reviewed, there were called in to his outpatient pharmacy by physician. IV and telemetry monitor removed. Room searched for patient belongings, and confirmed with patient that all valuables were accounted for. Wife assisted patient to dress, then staff escorted patient to discharge via wheelchair.

## 2016-03-14 NOTE — Progress Notes (Signed)
   03/14/16 1030  Clinical Encounter Type  Visited With Patient  Visit Type Other (Comment) ([place] consult)  Spiritual Encounters  Spiritual Needs Emotional  Stress Factors  Patient Stress Factors None identified  Introduction to Pt. Explained and provided copy of Advanced Directive. Pt will discuss with daughter. Follow up.

## 2016-03-14 NOTE — Discharge Instructions (Signed)
The office will arrange a stress test as an outpt.  They will call you with date and time.  Heart healthy diabetic diet.  Do not take sildenafil anymore until cleared by Dr. Jacinta Shoe.  You should not take within 72 hours of NTG or IMDUR.

## 2016-03-14 NOTE — Progress Notes (Signed)
Progress Note  Patient Name: [name] [name] Date of Encounter: 03/14/2016  Primary Cardiologist: Prudy Feeler / [name]   Subjective   76 yo with hx of CAD Admitted with CP . Resolved with SL NTG.   Has not returned.  Was started on Imdur last night  troponins are negative   Inpatient Medications    Scheduled Meds: . aspirin EC  81 mg Oral Daily  . atorvastatin  20 mg Oral QHS  . canagliflozin  100 mg Oral QAC breakfast  . ferrous sulfate  325 mg Oral BID WC  . folic acid  1 mg Oral Daily  . glipiZIDE  5 mg Oral QAC breakfast  . heparin  5,000 Units Subcutaneous Q8H  . isosorbide mononitrate  30 mg Oral Daily  . lisinopril  2.5 mg Oral Daily  . metoprolol  50 mg Oral BID  . pantoprazole  40 mg Oral Daily  . sodium chloride flush  3 mL Intravenous Q12H  . terazosin  5 mg Oral QPM   Continuous Infusions:  PRN Meds: sodium chloride, acetaminophen, nitroGLYCERIN, ondansetron (ZOFRAN) IV, sodium chloride flush   Vital Signs    Vitals:   03/13/16 2153 03/14/16 0500 03/14/16 0535 03/14/16 1000  BP: 138/62  (!) 145/67 (!) 143/72  Pulse: 64  61 90  Resp: 18  18   Temp: 97.6 F (36.4 C)  98 F (36.7 C) 97.5 F (36.4 C)  TempSrc: Oral  Oral Oral  SpO2: 99%  97% 98%  Weight:  182 lb 14.4 oz (83 kg)    Height:        Intake/Output Summary (Last 24 hours) at 03/14/16 1120 Last data filed at 03/14/16 0800  Gross per 24 hour  Intake              100 ml  Output                0 ml  Net              100 ml   Filed Weights   03/13/16 0739 03/13/16 1800 03/14/16 0500  Weight: 186 lb (84.4 kg) 185 lb 4.8 oz (84.1 kg) 182 lb 14.4 oz (83 kg)    Telemetry    NSR  - Personally Reviewed  ECG       Physical Exam   GEN: No acute distress.   Multiple tatoos Neck: No JVD Cardiac: RRR, no murmurs, rubs, or gallops.  Respiratory: Clear to auscultation bilaterally. GI: Soft, nontender, non-distended  MS: No edema; No deformity. Neuro:  Nonfocal  Psych: Normal affect    Labs    Chemistry Recent Labs Lab 03/13/16 0919 03/13/16 1836 03/14/16 0521  NA 138  --  138  K 4.0  --  4.4  CL 104  --  102  CO2 27  --  25  GLUCOSE 135*  --  147*  BUN 13  --  13  CREATININE 0.78 0.73 0.74  CALCIUM 9.2  --  9.5  PROT 6.3*  --  6.7  ALBUMIN 3.6  --  3.9  AST 20  --  20  ALT 20  --  18  ALKPHOS 70  --  78  BILITOT 0.7  --  0.8  GFRNONAA >60 >60 >60  GFRAA >60 >60 >60  ANIONGAP 7  --  11     Hematology Recent Labs Lab 03/13/16 0919 03/13/16 1836 03/14/16 0521  WBC 5.6 4.9 5.1  RBC 4.26 4.10* 4.42  HGB 12.8*  12.2* 13.3  HCT 38.3* 36.8* 40.0  MCV 89.9 89.8 90.5  MCH 30.0 29.8 30.1  MCHC 33.4 33.2 33.3  RDW 14.0 14.1 14.1  PLT 87* 86* 85*    Cardiac Enzymes Recent Labs Lab 03/13/16 1836 03/13/16 2314 03/14/16 0521  TROPONINI <0.03 <0.03 <0.03    Recent Labs Lab 03/13/16 0934  TROPIPOC 0.00     BNPNo results for input(s): BNP, PROBNP in the last 168 hours.   DDimer No results for input(s): DDIMER in the last 168 hours.   Radiology    Dg Chest 2 View  Result Date: 03/13/2016 CLINICAL DATA:  Chest pain EXAM: CHEST  2 VIEW COMPARISON:  01/09/2013 FINDINGS: Prior CABG. Heart is normal size. Areas of bibasilar scarring. No acute airspace opacities or effusions. No acute bony abnormality. IMPRESSION: No active cardiopulmonary disease. Electronically Signed   By: Rolm Baptise M.D.   On: 03/13/2016 08:40    Cardiac Studies     Patient Profile     76 y.o. male with hx of CAD. Was admitted with CP CP was not similar to his previous episode of CP prior to stenting .   Assessment & Plan    1. CP - seems different than his presenting angina. troponins are negative. Will ambulate in hallways. If he does well, can likely DC to home   He can follow up with Dr. Prudy Feeler for further evaluation .   Signed, Mertie Moores, MD  03/14/2016, 11:20 AM

## 2016-03-15 LAB — HEMOGLOBIN A1C
HEMOGLOBIN A1C: 6.7 % — AB (ref 4.8–5.6)
Mean Plasma Glucose: 146 mg/dL

## 2016-03-24 ENCOUNTER — Other Ambulatory Visit: Payer: Self-pay

## 2016-03-24 DIAGNOSIS — I10 Essential (primary) hypertension: Secondary | ICD-10-CM

## 2016-03-25 ENCOUNTER — Encounter (HOSPITAL_COMMUNITY): Admission: RE | Admit: 2016-03-25 | Payer: Medicare Other | Source: Ambulatory Visit

## 2016-03-25 ENCOUNTER — Inpatient Hospital Stay (HOSPITAL_COMMUNITY): Admission: RE | Admit: 2016-03-25 | Payer: Medicare Other | Source: Ambulatory Visit

## 2016-03-28 ENCOUNTER — Encounter (HOSPITAL_COMMUNITY)
Admission: RE | Admit: 2016-03-28 | Discharge: 2016-03-28 | Disposition: A | Discharge status: Home / Self Care | Payer: Medicare Other | Source: Ambulatory Visit | Attending: Cardiology | Admitting: Cardiology

## 2016-03-28 ENCOUNTER — Inpatient Hospital Stay (HOSPITAL_COMMUNITY): Admission: RE | Admit: 2016-03-28 | Payer: Medicare Other | Source: Ambulatory Visit

## 2016-03-28 ENCOUNTER — Encounter (HOSPITAL_COMMUNITY): Payer: Self-pay

## 2016-03-28 DIAGNOSIS — I2510 Atherosclerotic heart disease of native coronary artery without angina pectoris: Secondary | ICD-10-CM | POA: Insufficient documentation

## 2016-03-28 DIAGNOSIS — I10 Essential (primary) hypertension: Secondary | ICD-10-CM | POA: Diagnosis not present

## 2016-03-28 LAB — NM MYOCAR MULTI W/SPECT W/WALL MOTION / EF
LHR: 0.42
LVDIAVOL: 94 mL (ref 62–150)
LVSYSVOL: 33 mL
NUC STRESS TID: 1.22
Peak HR: 71 {beats}/min
Rest HR: 61 {beats}/min
SDS: 0
SRS: 0
SSS: 0

## 2016-03-28 MED ORDER — TECHNETIUM TC 99M TETROFOSMIN IV KIT
10.0000 | PACK | Freq: Once | INTRAVENOUS | Status: AC | PRN
  Administered 2016-03-28: 10.18 via INTRAVENOUS

## 2016-03-28 MED ORDER — SODIUM CHLORIDE 0.9% FLUSH
INTRAVENOUS | Status: AC
  Administered 2016-03-28: 10 mL via INTRAVENOUS
  Filled 2016-03-28: qty 10

## 2016-03-28 MED ORDER — REGADENOSON 0.4 MG/5ML IV SOLN
INTRAVENOUS | Status: AC
  Administered 2016-03-28: 0.4 mg via INTRAVENOUS
  Filled 2016-03-28: qty 5

## 2016-03-28 MED ORDER — TECHNETIUM TC 99M TETROFOSMIN IV KIT
30.0000 | PACK | Freq: Once | INTRAVENOUS | Status: AC | PRN
  Administered 2016-03-28: 31 via INTRAVENOUS

## 2016-03-31 ENCOUNTER — Ambulatory Visit (INDEPENDENT_AMBULATORY_CARE_PROVIDER_SITE_OTHER): Payer: Medicare Other | Admitting: Adult Health

## 2016-03-31 ENCOUNTER — Encounter: Payer: Self-pay | Admitting: Adult Health

## 2016-03-31 ENCOUNTER — Other Ambulatory Visit: Payer: Self-pay | Admitting: Family Medicine

## 2016-03-31 VITALS — BP 124/68 | HR 76 | Ht 70.0 in | Wt 183.0 lb

## 2016-03-31 DIAGNOSIS — E7800 Pure hypercholesterolemia, unspecified: Secondary | ICD-10-CM | POA: Diagnosis not present

## 2016-03-31 DIAGNOSIS — I10 Essential (primary) hypertension: Secondary | ICD-10-CM

## 2016-03-31 DIAGNOSIS — R002 Palpitations: Secondary | ICD-10-CM | POA: Diagnosis not present

## 2016-03-31 DIAGNOSIS — Z950 Presence of cardiac pacemaker: Secondary | ICD-10-CM

## 2016-03-31 MED ORDER — METOPROLOL TARTRATE 50 MG PO TABS: 50.0000 mg | ORAL_TABLET | Freq: Two times a day (BID) | ORAL | 6 refills | Status: DC

## 2016-03-31 MED ORDER — ISOSORBIDE MONONITRATE ER 60 MG PO TB24: 60.0000 mg | ORAL_TABLET | ORAL | 6 refills | Status: DC

## 2016-03-31 MED ORDER — LISINOPRIL 2.5 MG PO TABS: 2.5000 mg | ORAL_TABLET | Freq: Every day | ORAL | 3 refills | Status: DC

## 2016-03-31 NOTE — Progress Notes (Signed)
Name: [name] [name]    DOB: 1940-04-21  Age: 76 y.o.  MR#: [id number]       PCP:  [name], MD      Insurance: Payor: MEDICARE / Plan: MEDICARE PART A AND B / Product Type: *No Product type* /   CC:   No chief complaint on file.   VS Vitals:   03/31/16 1442  Weight: 183 lb (83 kg)  Height: 5\' 10"  (1.778 m)    Weights Current Weight  03/31/16 183 lb (83 kg)  03/14/16 182 lb 14.4 oz (83 kg)  02/07/16 189 lb (85.7 kg)    Blood Pressure  BP Readings from Last 3 Encounters:  03/14/16 (!) 143/72  02/07/16 106/60  10/29/15 140/68     Admit date:  (Not on file) Last encounter with RMR:  Visit date not found   Allergy Patient has no known allergies.  Current Outpatient Prescriptions  Medication Sig Dispense Refill  . aspirin 81 MG tablet Take 81 mg by mouth daily.    Marland Kitchen atorvastatin (LIPITOR) 20 MG tablet TAKE ONE TABLET BY MOUTH ONCE DAILY. 90 tablet 3  . clotrimazole (LOTRIMIN) 1 % cream Apply 1 application topically 2 (two) times daily. 30 g 0  . clotrimazole-betamethasone (LOTRISONE) cream Apply 1 application topically 2 (two) times daily. 30 g 0  . ferrous sulfate 325 (65 FE) MG tablet Take 325 mg by mouth 2 (two) times daily with a meal.    . folic acid (FOLVITE) 1 MG tablet TAKE ONE TABLET BY MOUTH ONCE DAILY. 90 tablet 0  . glipiZIDE (GLUCOTROL) 5 MG tablet TAKE ONE TABLET BY MOUTH ONCE DAILY BEFORE BREAKFAST. 30 tablet 0  . glucose blood test strip 1 each by Other route daily. Dispense per insurance and patient preference. 100 each 3  . isosorbide mononitrate (IMDUR) 30 MG 24 hr tablet Take 1-2 tablets (30-60 mg total) by mouth See admin instructions. 60 mg in the morning and 60 mg at supper(time) 120 tablet 6  . JARDIANCE 25 MG TABS tablet TAKE ONE TABLET BY MOUTH ONCE DAILY. 30 tablet 0  . Lancets 30G MISC 1 each by Does not apply route daily. Dispense per insurance and patient preference 100 each 3  . lisinopril (PRINIVIL,ZESTRIL) 2.5 MG tablet TAKE ONE TABLET BY  MOUTH DAILY. 90 tablet 0  . metFORMIN (GLUCOPHAGE) 500 MG tablet TAKE 1 TABLET BY MOUTH TWICE DAILY WITH A MEAL. 180 tablet 0  . metoprolol tartrate (LOPRESSOR) 50 MG tablet Take 1 tablet (50 mg total) by mouth 2 (two) times daily. 60 tablet 6  . nitroGLYCERIN (NITROSTAT) 0.4 MG SL tablet Place 1 tablet (0.4 mg total) under the tongue every 5 (five) minutes x 3 doses as needed for chest pain. 25 tablet 4  . omeprazole (PRILOSEC) 20 MG capsule Take 1 capsule (20 mg total) by mouth 2 (two) times daily before a meal. 60 capsule 6  . terazosin (HYTRIN) 5 MG capsule TAKE 1 CAPSULE BY MOUTH EVERY EVENING. 90 capsule 0   No current facility-administered medications for this visit.     Discontinued Meds:   There are no discontinued medications.  Patient Active Problem List   Diagnosis Date Noted  . Chest pain, negative MI, meds adjusted 03/13/2016  . Normocytic anemia 01/17/2015  . Hepatocellular carcinoma ([place]) 11/04/2014  . Liver mass   . Type II diabetes mellitus with neurological manifestations ([place]) 04/29/2013  . Tinnitus of both ears 04/29/2013  . H. pylori infection 01/31/2013  . CAD (coronary  artery disease) of artery bypass graft 01/31/2013  . Osteoarthritis of right knee 01/31/2013  . Ulceration ([place]) 01/31/2013  . Unspecified chronic bronchitis 01/31/2013  . 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 Encounter for long-term (current) use of NSAIDs 12/10/2012  . Right knee pain 11/25/2012  . BPH (benign prostatic hyperplasia) 11/25/2012  . Vitamin B12 deficiency 11/25/2012  . Tinea 10/21/2012  . HLD (hyperlipidemia) 10/07/2012  . HTN (hypertension) 10/07/2012  . CAD S/P percutaneous coronary angioplasty 10/07/2012  . Diabetes mellitus ([place]) 10/07/2012  . Pacemaker 10/07/2012  . Folic acid deficiency [id number]    LABS    Component Value Date/Time   NA 138 03/14/2016 0521   NA 138 03/13/2016 0919   NA 137 10/29/2015 1552   K 4.4  03/14/2016 0521   K 4.0 03/13/2016 0919   K 4.9 10/29/2015 1552   CL 102 03/14/2016 0521   CL 104 03/13/2016 0919   CL 102 10/29/2015 1552   CO2 25 03/14/2016 0521   CO2 27 03/13/2016 0919   CO2 28 10/29/2015 1552   GLUCOSE 147 (H) 03/14/2016 0521   GLUCOSE 135 (H) 03/13/2016 0919   GLUCOSE 135 (H) 10/29/2015 1552   BUN 13 03/14/2016 0521   BUN 13 03/13/2016 0919   BUN 17 10/29/2015 1552   CREATININE 0.74 03/14/2016 0521   CREATININE 0.73 03/13/2016 1836   CREATININE 0.78 03/13/2016 0919   CREATININE 0.91 10/29/2015 1552   CREATININE 0.84 05/14/2015 1547   CREATININE 0.70 01/17/2015 1142   CALCIUM 9.5 03/14/2016 0521   CALCIUM 9.2 03/13/2016 0919   CALCIUM 10.0 10/29/2015 1552   GFRNONAA >60 03/14/2016 0521   GFRNONAA >60 03/13/2016 1836   GFRNONAA >60 03/13/2016 0919   GFRNONAA 86 05/14/2015 1547   GFRNONAA >89 01/02/2015 0823   GFRNONAA >89 03/02/2014 1550   GFRAA >60 03/14/2016 0521   GFRAA >60 03/13/2016 1836   GFRAA >60 03/13/2016 0919   GFRAA >89 05/14/2015 1547   GFRAA >89 01/02/2015 0823   GFRAA >89 03/02/2014 1550   CMP     Component Value Date/Time   NA 138 03/14/2016 0521   K 4.4 03/14/2016 0521   CL 102 03/14/2016 0521   CO2 25 03/14/2016 0521   GLUCOSE 147 (H) 03/14/2016 0521   BUN 13 03/14/2016 0521   CREATININE 0.74 03/14/2016 0521   CREATININE 0.91 10/29/2015 1552   CALCIUM 9.5 03/14/2016 0521   PROT 6.7 03/14/2016 0521   ALBUMIN 3.9 03/14/2016 0521   AST 20 03/14/2016 0521   ALT 18 03/14/2016 0521   ALKPHOS 78 03/14/2016 0521   BILITOT 0.8 03/14/2016 0521   GFRNONAA >60 03/14/2016 0521   GFRNONAA 86 05/14/2015 1547   GFRAA >60 03/14/2016 0521   GFRAA >89 05/14/2015 1547       Component Value Date/Time   WBC 5.1 03/14/2016 0521   WBC 4.9 03/13/2016 1836   WBC 5.6 03/13/2016 0919   HGB 13.3 03/14/2016 0521   HGB 12.2 (L) 03/13/2016 1836   HGB 12.8 (L) 03/13/2016 0919   HCT 40.0 03/14/2016 0521   HCT 36.8 (L) 03/13/2016 1836   HCT  38.3 (L) 03/13/2016 0919   MCV 90.5 03/14/2016 0521   MCV 89.8 03/13/2016 1836   MCV 89.9 03/13/2016 0919    Lipid Panel     Component Value Date/Time   CHOL 137 03/14/2016 0521   TRIG 136 03/14/2016 0521   HDL 40 (L) 03/14/2016 0521   CHOLHDL 3.4 03/14/2016 0521  VLDL 27 03/14/2016 0521   LDLCALC 70 03/14/2016 0521    ABG No results found for: PHART, PCO2ART, PO2ART, HCO3, TCO2, ACIDBASEDEF, O2SAT   Lab Results  Component Value Date   TSH 2.458 03/13/2016   BNP (last 3 results) No results for input(s): BNP in the last 8760 hours.  ProBNP (last 3 results) No results for input(s): PROBNP in the last 8760 hours.  Cardiac Panel (last 3 results) No results for input(s): CKTOTAL, CKMB, TROPONINI, RELINDX in the last 72 hours.  Iron/TIBC/Ferritin/ %Sat    Component Value Date/Time   IRON 24 (L) 03/02/2014 1550   TIBC 424 03/02/2014 1550   FERRITIN 39 03/27/2015 1032   IRONPCTSAT 6 (L) 03/02/2014 1550     EKG Orders placed or performed during the hospital encounter of 03/13/16  . EKG 12-Lead  . EKG 12-Lead  . EKG 12-Lead  . EKG     Prior Assessment and Plan Problem List as of 03/31/2016 Reviewed: 02/07/2016  9:19 AM by Cristopher Peru, MD     Cardiovascular and Mediastinum   HTN (hypertension)   Last Assessment & Plan 01/05/2014 Office Visit Written 01/05/2014  4:15 PM by Evans Lance, MD    His blood pressure is well controlled. No change in meds. He is encouraged to increase his physical activity.      CAD S/P percutaneous coronary angioplasty   CAD (coronary artery disease) of artery bypass graft   Last Assessment & Plan 01/16/2015 Office Visit Written 01/16/2015 10:33 AM by Evans Lance, MD    He remains active and has no anginal symptoms. He will continue his current meds.         Respiratory   Unspecified chronic bronchitis     Digestive   Hepatic cirrhosis due to chronic hepatitis C infection [facility])   Last Assessment & Plan 01/17/2015 Office Visit  Written 01/17/2015 10:50 AM by Danie Binder, MD    WELL COMPENSATED DISEASE. NO BRBPR OR MELENA. EGD/TCS [id number]). PCP CONCERNED ABOUT NORMOCYTIC ANEMIA HB DOWN FROM 12 TO 10.8 AFTER MICROWAVE ABLATION & MOST LIKELY DUE TO HEMATURIA. PT THOUGHT TO HAVE ANEMIA OF CHRONIC DISEASE.  COMPLETE CT SCAN AT [facility]. GET A COPY OF THE IMAGES TO TAKE TO YOUR VISIT AT [facility]. COMPLETE FERRITIN. IF > 100-150, WOULD STOP IRON. LABS ONE WEEK PRIOR TO NEXT VISIT IN 6 MOS-PT/INR/AFP/CBC/CMP. FOLLOW UP IN 6 MOS.      Hepatocellular carcinoma [facility])   Last Assessment & Plan 01/17/2015 Office Visit Written 01/17/2015 10:47 AM by Danie Binder, MD    S/P MICROWAVE ABLATION OCT [id number] COMPLICATED BY HEMATURIA??-RESOLVED WITH ABX. LAST AFP DEC 2016-1.65  REPEAT CT AT [facility]. IMAGES WILL BE COPIED. PT INSTRUCTED TO TAKE DISC TO OPV AT [facility].          Endocrine   Diabetes mellitus ([place])   Type II diabetes mellitus with neurological manifestations [facility])   Last Assessment & Plan 10/29/2015 Office Visit Written 10/29/2015  3:50 PM by Alycia Rossetti, MD    On oral meds, goal A1C 7% recheck A1C today Tinea pedis- treat with clotrimazole to foot  Cracked heel- can continue cleaning and use of antibiotic ointment no sign of superinfection, vaseline/petroleum to keep skin hydrated.         Musculoskeletal and Integument   Osteoarthritis of right knee     Genitourinary   BPH (benign prostatic hyperplasia)     Other   HLD (hyperlipidemia)   Pacemaker   Last  Assessment & Plan 01/16/2015 Office Visit Written 01/16/2015 10:32 AM by Evans Lance, MD    His Medtronic PPM is working normally. His RV threshold is increased but he paces in the RV very infrequently. Will follow.       Folic acid deficiency   Tinea   Right knee pain   Vitamin B12 deficiency   Encounter for long-term (current) use of NSAIDs   Last Assessment & Plan 12/10/2012 Office Visit Written 12/10/2012 10:08 AM by Danie Binder, MD     PT ON ASA DAILY WITH PLT CT 99K & HB 13.  PPI DAILY. DISCUSSED BENEFITS OF PPI RX WITH PT AND DAUGHTER.      H. pylori infection   Ulceration (H[facility])   Tinnitus of both ears   Liver mass   Normocytic anemia   Last Assessment & Plan 01/17/2015 Office Visit Written 01/17/2015 10:53 AM by Danie Binder, MD    LIKELY DUE TO CHRONIC DISEASE & ASSOCIATED WITH ACUTE HEMATURIA AFTER ABLATION OF LIVER LESION. NO INDICATION FOR ENDOSCOPY AT THIS TIME. NO BRBPR OR MELENA.  FERRITIN TODAY. CONTINUE TO MONITOR CBCs. NO INDICATION FOR ENDOSCOPY AT THIS TIME. PT WOULD NEED 24HR OBS FOR CAPSULE STUDYDUE TO PACEMEAKER      Chest pain, negative MI, meds adjusted       Imaging: Dg Chest 2 View  Result Date: 03/13/2016 CLINICAL DATA:  Chest pain EXAM: CHEST  2 VIEW COMPARISON:  01/09/2013 FINDINGS: Prior CABG. Heart is normal size. Areas of bibasilar scarring. No acute airspace opacities or effusions. No acute bony abnormality. IMPRESSION: No active cardiopulmonary disease. Electronically Signed   By: Rolm Baptise M.D.   On: 03/13/2016 08:40   Nm Myocar Multi W/spect W/wall Motion / Ef  Result Date: 03/28/2016  There was no ST segment deviation noted during stress.  Defect 1: There is a medium defect of moderate severity present in the basal inferoseptal, basal inferior, basal inferolateral, mid inferoseptal, mid inferior and mid inferolateral location.  Findings consistent with prior myocardial infarction.  This is a low risk study.  Nuclear stress EF: 64%.
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
## 2016-03-31 NOTE — Patient Instructions (Signed)
Your physician wants you to follow-up in: 6 Months with Dr. Taylor. You will receive a reminder letter in the mail two months in advance. If you don't receive a letter, please call our office to schedule the follow-up appointment.  Your physician recommends that you continue on your current medications as directed. Please refer to the Current Medication list given to you today.  If you need a refill on your cardiac medications before your next appointment, please call your pharmacy.  Thank you for choosing [facility]!   

## 2016-04-18 DIAGNOSIS — C220 Liver cell carcinoma: Secondary | ICD-10-CM | POA: Diagnosis not present

## 2016-04-24 DIAGNOSIS — C220 Liver cell carcinoma: Secondary | ICD-10-CM | POA: Diagnosis not present

## 2016-04-26 ENCOUNTER — Other Ambulatory Visit: Payer: Self-pay | Admitting: Family Medicine

## 2016-05-08 ENCOUNTER — Telehealth: Payer: Self-pay | Admitting: Cardiology

## 2016-05-08 ENCOUNTER — Ambulatory Visit (INDEPENDENT_AMBULATORY_CARE_PROVIDER_SITE_OTHER): Payer: Medicare Other | Admitting: *Deleted

## 2016-05-08 DIAGNOSIS — R002 Palpitations: Secondary | ICD-10-CM | POA: Diagnosis not present

## 2016-05-08 NOTE — Telephone Encounter (Signed)
Spoke with pt and reminded pt of remote transmission that is due today. Pt verbalized understanding.   

## 2016-05-09 ENCOUNTER — Encounter: Payer: Self-pay | Admitting: Cardiology

## 2016-05-09 LAB — CUP PACEART REMOTE DEVICE CHECK
Battery Impedance: 1313 Ohm
Battery Remaining Longevity: 49 mo
Battery Voltage: 2.78 V
Brady Statistic AP VP Percent: 1 %
Brady Statistic AS VP Percent: 0 %
Date Time Interrogation Session: 20180405204723
Implantable Lead Implant Date: 20100520
Implantable Lead Implant Date: 20100520
Implantable Lead Location: 753860
Implantable Pulse Generator Implant Date: 20100520
Lead Channel Impedance Value: 427 Ohm
Lead Channel Pacing Threshold Amplitude: 0.375 V
Lead Channel Pacing Threshold Amplitude: 1.625 V
Lead Channel Setting Pacing Amplitude: 2 V
Lead Channel Setting Pacing Amplitude: 3.25 V
Lead Channel Setting Pacing Pulse Width: 0.4 ms
MDC IDC LEAD LOCATION: 753859
MDC IDC MSMT LEADCHNL RA PACING THRESHOLD PULSEWIDTH: 0.4 ms
MDC IDC MSMT LEADCHNL RV IMPEDANCE VALUE: 599 Ohm
MDC IDC MSMT LEADCHNL RV PACING THRESHOLD PULSEWIDTH: 0.4 ms
MDC IDC SET LEADCHNL RV SENSING SENSITIVITY: 2 mV
MDC IDC STAT BRADY AP VS PERCENT: 85 %
MDC IDC STAT BRADY AS VS PERCENT: 14 %

## 2016-05-09 NOTE — Progress Notes (Signed)
Remote pacemaker transmission.   

## 2016-06-19 ENCOUNTER — Encounter: Payer: Self-pay | Admitting: Family Medicine

## 2016-06-19 ENCOUNTER — Ambulatory Visit (INDEPENDENT_AMBULATORY_CARE_PROVIDER_SITE_OTHER): Payer: Medicare Other | Admitting: Family Medicine

## 2016-06-19 ENCOUNTER — Other Ambulatory Visit: Payer: Self-pay | Admitting: Cardiology

## 2016-06-19 VITALS — BP 120/64 | HR 64 | Temp 97.7°F | Resp 14 | Ht 70.0 in | Wt 190.0 lb

## 2016-06-19 DIAGNOSIS — E7800 Pure hypercholesterolemia, unspecified: Secondary | ICD-10-CM | POA: Diagnosis not present

## 2016-06-19 DIAGNOSIS — C220 Liver cell carcinoma: Secondary | ICD-10-CM

## 2016-06-19 DIAGNOSIS — Z0000 Encounter for general adult medical examination without abnormal findings: Secondary | ICD-10-CM | POA: Diagnosis not present

## 2016-06-19 DIAGNOSIS — I10 Essential (primary) hypertension: Secondary | ICD-10-CM | POA: Diagnosis not present

## 2016-06-19 DIAGNOSIS — E1149 Type 2 diabetes mellitus with other diabetic neurological complication: Secondary | ICD-10-CM

## 2016-06-19 DIAGNOSIS — B182 Chronic viral hepatitis C: Secondary | ICD-10-CM | POA: Diagnosis not present

## 2016-06-19 DIAGNOSIS — I2510 Atherosclerotic heart disease of native coronary artery without angina pectoris: Secondary | ICD-10-CM
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
## 2016-06-20 DIAGNOSIS — E1149 Type 2 diabetes mellitus with other diabetic neurological complication: Secondary | ICD-10-CM | POA: Diagnosis not present

## 2016-06-20 LAB — CBC WITH DIFFERENTIAL/PLATELET
BASOS PCT: 0 %
Basophils Absolute: 0 cells/uL (ref 0–200)
EOS ABS: 159 {cells}/uL (ref 15–500)
EOS PCT: 3 %
HCT: 40.7 % (ref 38.5–50.0)
Hemoglobin: 13.3 g/dL (ref 13.0–17.0)
LYMPHS PCT: 23 %
Lymphs Abs: 1219 cells/uL (ref 850–3900)
MCH: 30.2 pg (ref 27.0–33.0)
MCHC: 32.7 g/dL (ref 32.0–36.0)
MCV: 92.3 fL (ref 80.0–100.0)
MONOS PCT: 7 %
MPV: 12.4 fL (ref 7.5–12.5)
Monocytes Absolute: 371 cells/uL (ref 200–950)
Neutro Abs: 3551 cells/uL (ref 1500–7800)
Neutrophils Relative %: 67 %
Platelets: 103 10*3/uL — ABNORMAL LOW (ref 140–400)
RBC: 4.41 MIL/uL (ref 4.20–5.80)
RDW: 14.5 % (ref 11.0–15.0)
WBC: 5.3 10*3/uL (ref 3.8–10.8)

## 2016-06-20 LAB — COMPLETE METABOLIC PANEL WITH GFR
ALK PHOS: 96 U/L (ref 40–115)
ALT: 16 U/L (ref 9–46)
AST: 15 U/L (ref 10–35)
Albumin: 4.3 g/dL (ref 3.6–5.1)
BILIRUBIN TOTAL: 0.5 mg/dL (ref 0.2–1.2)
BUN: 19 mg/dL (ref 7–25)
CALCIUM: 9.8 mg/dL (ref 8.6–10.3)
CO2: 25 mmol/L (ref 20–31)
CREATININE: 0.94 mg/dL (ref 0.70–1.18)
Chloride: 101 mmol/L (ref 98–110)
GFR, Est Non African American: 78 mL/min (ref >=60)
Glucose, Bld: 154 mg/dL — ABNORMAL HIGH (ref 70–99)
Potassium: 4.4 mmol/L (ref 3.5–5.3)
Sodium: 137 mmol/L (ref 135–146)
Total Protein: 7 g/dL (ref 6.1–8.1)

## 2016-06-20 LAB — HEMOGLOBIN A1C
Hgb A1c MFr Bld: 7 % — ABNORMAL HIGH (ref <5.7)
Mean Plasma Glucose: 154 mg/dL

## 2016-06-21 LAB — MICROALBUMIN, URINE: Microalb, Ur: 1.1 mg/dL

## 2016-06-23 ENCOUNTER — Encounter: Payer: Self-pay | Admitting: Family Medicine

## 2016-07-14 ENCOUNTER — Other Ambulatory Visit: Payer: Self-pay | Admitting: Internal Medicine

## 2016-08-01 ENCOUNTER — Other Ambulatory Visit: Payer: Self-pay | Admitting: Family Medicine

## 2016-08-11 ENCOUNTER — Telehealth: Payer: Self-pay | Admitting: Cardiology

## 2016-08-11 ENCOUNTER — Encounter: Payer: Medicare Other | Admitting: *Deleted

## 2016-08-11 NOTE — Telephone Encounter (Signed)
Spoke with pt and reminded pt of remote transmission that is due today. Pt verbalized understanding.   

## 2016-08-14 ENCOUNTER — Ambulatory Visit (INDEPENDENT_AMBULATORY_CARE_PROVIDER_SITE_OTHER): Payer: Medicare Other | Admitting: *Deleted

## 2016-08-14 DIAGNOSIS — R002 Palpitations: Secondary | ICD-10-CM

## 2016-08-14 DIAGNOSIS — Z950 Presence of cardiac pacemaker: Secondary | ICD-10-CM

## 2016-08-14 DIAGNOSIS — R001 Bradycardia, unspecified: Secondary | ICD-10-CM

## 2016-08-18 NOTE — Progress Notes (Signed)
Remote pacemaker transmission.   

## 2016-08-19 ENCOUNTER — Encounter: Payer: Self-pay | Admitting: Cardiology

## 2016-09-03 ENCOUNTER — Other Ambulatory Visit: Payer: Self-pay | Admitting: Family Medicine

## 2016-09-12 LAB — CUP PACEART REMOTE DEVICE CHECK
Battery Impedance: 1421 Ohm
Battery Voltage: 2.77 V
Brady Statistic AP VP Percent: 0 %
Brady Statistic AP VS Percent: 81 %
Date Time Interrogation Session: 20180712214846
Implantable Lead Implant Date: 20100520
Implantable Lead Location: 753860
Implantable Lead Model: 4092
Implantable Lead Model: 5076
Lead Channel Impedance Value: 492 Ohm
Lead Channel Pacing Threshold Pulse Width: 0.4 ms
Lead Channel Setting Pacing Amplitude: 4 V
Lead Channel Setting Pacing Pulse Width: 0.4 ms
MDC IDC LEAD IMPLANT DT: 20100520
MDC IDC LEAD LOCATION: 753859
MDC IDC MSMT BATTERY REMAINING LONGEVITY: 48 mo
MDC IDC MSMT LEADCHNL RA PACING THRESHOLD AMPLITUDE: 0.5 V
MDC IDC MSMT LEADCHNL RV IMPEDANCE VALUE: 591 Ohm
MDC IDC MSMT LEADCHNL RV PACING THRESHOLD AMPLITUDE: 2 V
MDC IDC MSMT LEADCHNL RV PACING THRESHOLD PULSEWIDTH: 0.4 ms
MDC IDC PG IMPLANT DT: 20100520
MDC IDC SET LEADCHNL RA PACING AMPLITUDE: 2 V
MDC IDC SET LEADCHNL RV SENSING SENSITIVITY: 2 mV
MDC IDC STAT BRADY AS VP PERCENT: 0 %
MDC IDC STAT BRADY AS VS PERCENT: 18 %

## 2016-10-06 ENCOUNTER — Other Ambulatory Visit: Payer: Self-pay | Admitting: Family Medicine

## 2016-10-16 ENCOUNTER — Ambulatory Visit (INDEPENDENT_AMBULATORY_CARE_PROVIDER_SITE_OTHER): Payer: Medicare Other | Admitting: Internal Medicine

## 2016-10-16 ENCOUNTER — Encounter: Payer: Self-pay | Admitting: Internal Medicine

## 2016-10-16 VITALS — BP 122/62 | HR 80 | Ht 70.0 in | Wt 184.0 lb

## 2016-10-16 DIAGNOSIS — I2510 Atherosclerotic heart disease of native coronary artery without angina pectoris: Secondary | ICD-10-CM

## 2016-10-16 DIAGNOSIS — Z950 Presence of cardiac pacemaker: Secondary | ICD-10-CM | POA: Diagnosis not present
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
## 2016-10-16 NOTE — Patient Instructions (Signed)
Medication Instructions:  Your physician recommends that you continue on your current medications as directed. Please refer to the Current Medication list given to you today.   Labwork: NONE  Testing/Procedures: NONE  Follow-Up: Your physician wants you to follow-up in: 1 Year with Dr. [name]. You will receive a reminder letter in the mail two months in advance. If you don't receive a letter, please call our office to schedule the follow-up appointment.  Remote monitoring is used to monitor your Pacemaker of ICD from home. This monitoring reduces the number of office visits required to check your device to one time per year. It allows [place] to keep an eye on the functioning of your device to ensure it is working properly. You are scheduled for a device check from home on 11/17/16. You may send your transmission at any time that day. If you have a wireless device, the transmission will be sent automatically. After your physician reviews your transmission, you will receive a postcard with your next transmission date.   Any Other Special Instructions Will Be Listed Below (If Applicable). Stop taking your Atorvastatin for 1 month. If your cramps stop do not restart the Atorvastatin. If the cramps do not stop restart the Atorvastatin.     If you need a refill on your cardiac medications before your next appointment, please call your pharmacy.  Thank you for choosing [facility]!

## 2016-10-28 LAB — CUP PACEART INCLINIC DEVICE CHECK
Battery Impedance: 1534 Ohm
Battery Remaining Longevity: 45 mo
Brady Statistic AP VP Percent: 0 %
Brady Statistic AS VS Percent: 21 %
Date Time Interrogation Session: 20180913134855
Implantable Lead Location: 753859
Implantable Lead Location: 753860
Implantable Lead Model: 4092
Implantable Pulse Generator Implant Date: 20100520
Lead Channel Impedance Value: 524 Ohm
Lead Channel Pacing Threshold Pulse Width: 0.4 ms
Lead Channel Pacing Threshold Pulse Width: 0.4 ms
Lead Channel Pacing Threshold Pulse Width: 1 ms
Lead Channel Setting Pacing Amplitude: 3 V
Lead Channel Setting Pacing Pulse Width: 1 ms
Lead Channel Setting Sensing Sensitivity: 2 mV
MDC IDC LEAD IMPLANT DT: 20100520
MDC IDC LEAD IMPLANT DT: 20100520
MDC IDC MSMT BATTERY VOLTAGE: 2.77 V
MDC IDC MSMT LEADCHNL RA IMPEDANCE VALUE: 451 Ohm
MDC IDC MSMT LEADCHNL RA PACING THRESHOLD AMPLITUDE: 0.5 V
MDC IDC MSMT LEADCHNL RA PACING THRESHOLD AMPLITUDE: 0.5 V
MDC IDC MSMT LEADCHNL RA SENSING INTR AMPL: 4 mV
MDC IDC MSMT LEADCHNL RV PACING THRESHOLD AMPLITUDE: 1.5 V
MDC IDC MSMT LEADCHNL RV PACING THRESHOLD AMPLITUDE: 2 V
MDC IDC MSMT LEADCHNL RV PACING THRESHOLD PULSEWIDTH: 0.4 ms
MDC IDC MSMT LEADCHNL RV SENSING INTR AMPL: 5.6 mV
MDC IDC SET LEADCHNL RA PACING AMPLITUDE: 2 V
MDC IDC STAT BRADY AP VS PERCENT: 79 %
MDC IDC STAT BRADY AS VP PERCENT: 0 %

## 2016-11-03 ENCOUNTER — Other Ambulatory Visit: Payer: Self-pay | Admitting: Family Medicine

## 2016-11-03 NOTE — Telephone Encounter (Signed)
Refill appropriate 

## 2016-11-04 ENCOUNTER — Ambulatory Visit (INDEPENDENT_AMBULATORY_CARE_PROVIDER_SITE_OTHER): Payer: Medicare Other | Admitting: *Deleted

## 2016-11-04 ENCOUNTER — Telehealth: Payer: Self-pay | Admitting: Family Medicine

## 2016-11-04 DIAGNOSIS — Z23 Encounter for immunization: Secondary | ICD-10-CM | POA: Diagnosis not present

## 2016-11-04 MED ORDER — TERAZOSIN HCL 5 MG PO CAPS: 5.0000 mg | ORAL_CAPSULE | Freq: Every evening | ORAL | 0 refills | Status: DC

## 2016-11-04 NOTE — Telephone Encounter (Signed)
Pt needs refill on terazosin called in to [facility]

## 2016-11-04 NOTE — Progress Notes (Signed)
Patient seen in office for Influenza Vaccination.   Tolerated IM administration well.   Immunization history updated.  

## 2016-11-04 NOTE — Telephone Encounter (Signed)
rx sent to pharmacy

## 2016-11-17 ENCOUNTER — Ambulatory Visit (INDEPENDENT_AMBULATORY_CARE_PROVIDER_SITE_OTHER): Payer: Medicare Other | Admitting: *Deleted

## 2016-11-17 ENCOUNTER — Telehealth: Payer: Self-pay | Admitting: Cardiology

## 2016-11-17 DIAGNOSIS — C220 Liver cell carcinoma: Secondary | ICD-10-CM | POA: Diagnosis not present

## 2016-11-17 DIAGNOSIS — R001 Bradycardia, unspecified: Secondary | ICD-10-CM | POA: Diagnosis not present

## 2016-11-17 NOTE — Telephone Encounter (Signed)
Spoke with pt and reminded pt of remote transmission that is due today. Pt verbalized understanding.   

## 2016-11-18 NOTE — Progress Notes (Signed)
Remote pacemaker transmission.   

## 2016-11-19 LAB — CUP PACEART REMOTE DEVICE CHECK
Battery Remaining Longevity: 27 mo
Battery Voltage: 2.76 V
Brady Statistic AP VS Percent: 65 %
Date Time Interrogation Session: 20181015212059
Implantable Lead Implant Date: 20100520
Implantable Lead Location: 753859
Implantable Lead Model: 4092
Lead Channel Pacing Threshold Pulse Width: 0.4 ms
Lead Channel Setting Pacing Amplitude: 2 V
Lead Channel Setting Pacing Pulse Width: 1 ms
MDC IDC LEAD IMPLANT DT: 20100520
MDC IDC LEAD LOCATION: 753860
MDC IDC MSMT BATTERY IMPEDANCE: 1539 Ohm
MDC IDC MSMT LEADCHNL RA IMPEDANCE VALUE: 439 Ohm
MDC IDC MSMT LEADCHNL RA PACING THRESHOLD AMPLITUDE: 0.5 V
MDC IDC MSMT LEADCHNL RA PACING THRESHOLD PULSEWIDTH: 0.4 ms
MDC IDC MSMT LEADCHNL RV IMPEDANCE VALUE: 598 Ohm
MDC IDC MSMT LEADCHNL RV PACING THRESHOLD AMPLITUDE: 2.5 V
MDC IDC PG IMPLANT DT: 20100520
MDC IDC SET LEADCHNL RV PACING AMPLITUDE: 3.25 V
MDC IDC SET LEADCHNL RV SENSING SENSITIVITY: 2 mV
MDC IDC STAT BRADY AP VP PERCENT: 0 %
MDC IDC STAT BRADY AS VP PERCENT: 0 %
MDC IDC STAT BRADY AS VS PERCENT: 35 %

## 2016-11-20 DIAGNOSIS — C220 Liver cell carcinoma: Secondary | ICD-10-CM | POA: Diagnosis not present

## 2016-11-21 ENCOUNTER — Encounter: Payer: Self-pay | Admitting: Cardiology

## 2016-11-22 ENCOUNTER — Other Ambulatory Visit: Payer: Self-pay | Admitting: Adult Health

## 2016-12-27 ENCOUNTER — Other Ambulatory Visit: Payer: Self-pay | Admitting: Cardiology

## 2017-01-12 ENCOUNTER — Emergency Department (HOSPITAL_COMMUNITY): Payer: Medicare Other

## 2017-01-12 ENCOUNTER — Emergency Department (HOSPITAL_COMMUNITY)
Admission: EM | Admit: 2017-01-12 | Discharge: 2017-01-12 | Disposition: A | Discharge status: Home / Self Care | Payer: Medicare Other | Attending: Emergency Medicine | Admitting: Emergency Medicine

## 2017-01-12 ENCOUNTER — Encounter (HOSPITAL_COMMUNITY): Payer: Self-pay | Admitting: Emergency Medicine

## 2017-01-12 DIAGNOSIS — I2510 Atherosclerotic heart disease of native coronary artery without angina pectoris: Secondary | ICD-10-CM | POA: Diagnosis not present

## 2017-01-12 DIAGNOSIS — Z87891 Personal history of nicotine dependence: Secondary | ICD-10-CM | POA: Insufficient documentation

## 2017-01-12 DIAGNOSIS — I252 Old myocardial infarction: Secondary | ICD-10-CM | POA: Diagnosis not present

## 2017-01-12 DIAGNOSIS — Z950 Presence of cardiac pacemaker: Secondary | ICD-10-CM | POA: Insufficient documentation

## 2017-01-12 DIAGNOSIS — Z7984 Long term (current) use of oral hypoglycemic drugs: Secondary | ICD-10-CM | POA: Insufficient documentation

## 2017-01-12 DIAGNOSIS — M25561 Pain in right knee: Secondary | ICD-10-CM | POA: Diagnosis not present

## 2017-01-12 DIAGNOSIS — Z7982 Long term (current) use of aspirin: Secondary | ICD-10-CM | POA: Insufficient documentation

## 2017-01-12 DIAGNOSIS — I10 Essential (primary) hypertension: Secondary | ICD-10-CM | POA: Insufficient documentation

## 2017-01-12 DIAGNOSIS — E119 Type 2 diabetes mellitus without complications: Secondary | ICD-10-CM | POA: Insufficient documentation

## 2017-01-12 DIAGNOSIS — Z79899 Other long term (current) drug therapy: Secondary | ICD-10-CM | POA: Insufficient documentation

## 2017-01-12 DIAGNOSIS — M7989 Other specified soft tissue disorders: Secondary | ICD-10-CM | POA: Diagnosis not present

## 2017-01-12 DIAGNOSIS — Z955 Presence of coronary angioplasty implant and graft: Secondary | ICD-10-CM | POA: Diagnosis not present

## 2017-01-12 MED ORDER — HYDROCODONE-ACETAMINOPHEN 5-325 MG PO TABS
1.0000 | ORAL_TABLET | Freq: Once | ORAL | Status: AC
  Administered 2017-01-12: 1 via ORAL
  Filled 2017-01-12: qty 1

## 2017-01-12 MED ORDER — TRAMADOL HCL 50 MG PO TABS: 50.0000 mg | ORAL_TABLET | Freq: Four times a day (QID) | ORAL | 0 refills | Status: DC | PRN
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
## 2017-01-12 NOTE — ED Triage Notes (Signed)
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 Complaining of pain to right knee since injury.

## 2017-01-12 NOTE — Discharge Instructions (Signed)
Use a walker to ambulate with.  Try to keep your leg elevated and rested as much as possible and follow-up with your family doctor next week for recheck

## 2017-01-13 ENCOUNTER — Other Ambulatory Visit: Payer: Self-pay

## 2017-01-13 NOTE — Patient Outreach (Signed)
[facility]) Care Management  01/13/2017  Neko Boyajian [birth year] [id number]   Telephone call placed to the patient for Ed Utilization. HIPAA verified with patient. Discussed and offered [facility] care management services.  The patient did not feel that he needed the services at this time.  Plan:RN Health Coach will notify [facility] administrative assistant of case status.   Lazaro Arms RN, BSN, [place] Direct Dial:  [phone] Fax: [phone]

## 2017-01-22 ENCOUNTER — Ambulatory Visit (INDEPENDENT_AMBULATORY_CARE_PROVIDER_SITE_OTHER): Payer: Medicare Other | Admitting: Family Medicine

## 2017-01-22 ENCOUNTER — Encounter: Payer: Self-pay | Admitting: Family Medicine

## 2017-01-22 VITALS — BP 120/60 | HR 66 | Temp 98.0°F | Resp 18 | Ht 70.0 in | Wt 189.0 lb

## 2017-01-22 DIAGNOSIS — I2510 Atherosclerotic heart disease of native coronary artery without angina pectoris: Secondary | ICD-10-CM | POA: Diagnosis not present

## 2017-01-22 DIAGNOSIS — L304 Erythema intertrigo: Secondary | ICD-10-CM

## 2017-01-22 DIAGNOSIS — I10 Essential (primary) hypertension: Secondary | ICD-10-CM | POA: Diagnosis not present

## 2017-01-22 DIAGNOSIS — S8391XD Sprain of unspecified site of right knee, subsequent encounter: Secondary | ICD-10-CM | POA: Diagnosis not present

## 2017-01-22 DIAGNOSIS — E119 Type 2 diabetes mellitus without complications: Secondary | ICD-10-CM | POA: Diagnosis not present

## 2017-01-22 MED ORDER — CLOTRIMAZOLE-BETAMETHASONE 1-0.05 % EX CREA: 1.0000 "application " | TOPICAL_CREAM | Freq: Two times a day (BID) | CUTANEOUS | 0 refills | Status: DC
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
## 2017-01-23 ENCOUNTER — Other Ambulatory Visit: Payer: Medicare Other

## 2017-01-23 DIAGNOSIS — E119 Type 2 diabetes mellitus without complications: Secondary | ICD-10-CM | POA: Diagnosis not present

## 2017-01-24 LAB — COMPLETE METABOLIC PANEL WITH GFR
AG Ratio: 1.5 (calc) (ref 1.0–2.5)
ALBUMIN MSPROF: 4 g/dL (ref 3.6–5.1)
ALT: 17 U/L (ref 9–46)
AST: 16 U/L (ref 10–35)
Alkaline phosphatase (APISO): 87 U/L (ref 40–115)
BUN: 20 mg/dL (ref 7–25)
CALCIUM: 9.2 mg/dL (ref 8.6–10.3)
CHLORIDE: 105 mmol/L (ref 98–110)
CO2: 24 mmol/L (ref 20–32)
Creat: 0.8 mg/dL (ref 0.70–1.18)
GFR, Est African American: 101 mL/min/{1.73_m2} (ref >=60)
GFR, Est Non African American: 87 mL/min/{1.73_m2} (ref >=60)
GLOBULIN: 2.6 g/dL (ref 1.9–3.7)
Glucose, Bld: 136 mg/dL — ABNORMAL HIGH (ref 65–99)
POTASSIUM: 4 mmol/L (ref 3.5–5.3)
Sodium: 137 mmol/L (ref 135–146)
TOTAL PROTEIN: 6.6 g/dL (ref 6.1–8.1)
Total Bilirubin: 0.6 mg/dL (ref 0.2–1.2)

## 2017-01-24 LAB — HEMOGLOBIN A1C
HEMOGLOBIN A1C: 7.1 %{Hb} — AB (ref <5.7)
Mean Plasma Glucose: 157 (calc)
eAG (mmol/L): 8.7 (calc)

## 2017-01-24 LAB — LIPID PANEL
CHOLESTEROL: 156 mg/dL (ref <200)
HDL: 39 mg/dL — ABNORMAL LOW (ref >40)
LDL CHOLESTEROL (CALC): 96 mg/dL
Non-HDL Cholesterol (Calc): 117 mg/dL (calc) (ref <130)
TRIGLYCERIDES: 111 mg/dL (ref <150)
Total CHOL/HDL Ratio: 4 (calc) (ref <5.0)

## 2017-01-24 LAB — MICROALBUMIN, URINE: Microalb, Ur: 1.2 mg/dL

## 2017-01-26 ENCOUNTER — Other Ambulatory Visit: Payer: Self-pay | Admitting: Family Medicine

## 2017-01-26 MED ORDER — ATORVASTATIN CALCIUM 40 MG PO TABS: 40.0000 mg | ORAL_TABLET | Freq: Every day | ORAL | 3 refills | Status: DC

## 2017-01-26 NOTE — Progress Notes (Signed)
lipitor

## 2017-02-05 ENCOUNTER — Other Ambulatory Visit: Payer: Self-pay | Admitting: Adult Health

## 2017-02-05 ENCOUNTER — Other Ambulatory Visit: Payer: Self-pay | Admitting: Family Medicine

## 2017-02-16 ENCOUNTER — Ambulatory Visit (INDEPENDENT_AMBULATORY_CARE_PROVIDER_SITE_OTHER): Payer: Medicare Other | Admitting: *Deleted

## 2017-02-16 DIAGNOSIS — R001 Bradycardia, unspecified: Secondary | ICD-10-CM

## 2017-02-18 ENCOUNTER — Telehealth: Payer: Self-pay | Admitting: Gastroenterology

## 2017-02-18 NOTE — Telephone Encounter (Signed)
PLEASE CALL PT. HE NEEDS A FOLLOW UP APPT-FIRST AVAILABLE SLOT WITH DR.Mylasia Vorhees, DX: CIRRHOSIS.

## 2017-02-19 NOTE — Telephone Encounter (Signed)
CALLED PATIENT AND SCHEDULED HIM

## 2017-02-20 ENCOUNTER — Encounter: Payer: Self-pay | Admitting: Cardiology

## 2017-02-20 NOTE — Progress Notes (Signed)
Remote pacemaker transmission.   

## 2017-02-22 ENCOUNTER — Encounter: Payer: Self-pay | Admitting: *Deleted

## 2017-02-22 DIAGNOSIS — I480 Paroxysmal atrial fibrillation: Secondary | ICD-10-CM | POA: Insufficient documentation

## 2017-02-22 LAB — CUP PACEART REMOTE DEVICE CHECK
Battery Remaining Longevity: 42 mo
Brady Statistic AP VP Percent: 0 %
Brady Statistic AS VP Percent: 0 %
Brady Statistic AS VS Percent: 16 %
Date Time Interrogation Session: 20190116162553
Implantable Lead Implant Date: 20100520
Implantable Lead Implant Date: 20100520
Implantable Lead Location: 753859
Implantable Lead Model: 4092
Implantable Pulse Generator Implant Date: 20100520
Lead Channel Impedance Value: 445 Ohm
Lead Channel Impedance Value: 641 Ohm
Lead Channel Pacing Threshold Amplitude: 2.25 V
Lead Channel Pacing Threshold Pulse Width: 0.4 ms
Lead Channel Setting Pacing Amplitude: 2 V
Lead Channel Setting Pacing Amplitude: 3.25 V
Lead Channel Setting Sensing Sensitivity: 2.8 mV
MDC IDC LEAD LOCATION: 753860
MDC IDC MSMT BATTERY IMPEDANCE: 1649 Ohm
MDC IDC MSMT BATTERY VOLTAGE: 2.76 V
MDC IDC MSMT LEADCHNL RA PACING THRESHOLD AMPLITUDE: 0.5 V
MDC IDC MSMT LEADCHNL RA PACING THRESHOLD PULSEWIDTH: 0.4 ms
MDC IDC SET LEADCHNL RV PACING PULSEWIDTH: 1 ms
MDC IDC STAT BRADY AP VS PERCENT: 84 %

## 2017-03-02 ENCOUNTER — Ambulatory Visit (INDEPENDENT_AMBULATORY_CARE_PROVIDER_SITE_OTHER): Payer: Medicare Other | Admitting: Family Medicine

## 2017-03-02 ENCOUNTER — Encounter: Payer: Self-pay | Admitting: Family Medicine

## 2017-03-02 VITALS — BP 108/58 | HR 66 | Temp 97.7°F | Resp 16 | Ht 70.0 in | Wt 189.0 lb

## 2017-03-02 DIAGNOSIS — M25561 Pain in right knee: Secondary | ICD-10-CM
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
## 2017-03-12 ENCOUNTER — Other Ambulatory Visit: Payer: Self-pay | Admitting: Family Medicine

## 2017-04-02 ENCOUNTER — Other Ambulatory Visit: Payer: Self-pay

## 2017-04-02 ENCOUNTER — Ambulatory Visit (INDEPENDENT_AMBULATORY_CARE_PROVIDER_SITE_OTHER): Payer: Medicare Other | Admitting: Gastroenterology

## 2017-04-02 ENCOUNTER — Encounter: Payer: Self-pay | Admitting: Gastroenterology

## 2017-04-02 DIAGNOSIS — K7460 Unspecified cirrhosis of liver: Secondary | ICD-10-CM | POA: Diagnosis not present

## 2017-04-02 DIAGNOSIS — B182 Chronic viral hepatitis C: Secondary | ICD-10-CM

## 2017-04-02 DIAGNOSIS — D649 Anemia, unspecified: Secondary | ICD-10-CM | POA: Diagnosis not present

## 2017-04-02 DIAGNOSIS — K219 Gastro-esophageal reflux disease without esophagitis: Secondary | ICD-10-CM | POA: Diagnosis not present

## 2017-04-02 NOTE — Progress Notes (Signed)
ON RECALL  °

## 2017-04-02 NOTE — Progress Notes (Signed)
cc'ed to pcp °

## 2017-04-02 NOTE — Patient Instructions (Signed)
COMPLETE LABS WITHIN 7 DAYS. I AM CHECKING YOUR BLOOD COUNT AND IRON STORES.  STOP TAKING IRON. RECHECK YOUR BLOOD COUNT AND IRON STORES IN JUN 2019.   REDUCE OMEPRAZOLE.  TAKE 30 MINUTES PRIOR TO YOUR FIRST MEAL.  COMPLETE IMAGING AT [facility].  FOLLOW UP IN 6 MOS.

## 2017-04-02 NOTE — Assessment & Plan Note (Signed)
SYMPTOMS CONTROLLED/RESOLVED.  REDUCE OMEPRAZOLE.  TAKE 30 MINUTES PRIOR TO YOUR FIRST MEAL. CONTINUE TO MONITOR SYMPTOMS. FOLLOW UP IN 6 MOS.

## 2017-04-02 NOTE — Assessment & Plan Note (Addendum)
NO BRBPR OR MELENA ON IRON LAST CBC NL Hb(MAY 2018).  COMPLETE LABS WITHIN 7 DAYS. I AM CHECKING YOUR BLOOD COUNT AND IRON STORES. STOP TAKING IRON. RECHECK YOUR BLOOD COUNT AND IRON STORES IN JUN 2019. FOLLOW UP IN 6 MOS.
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
## 2017-04-02 NOTE — Assessment & Plan Note (Signed)
LIVER ENZYMES NL IN DEC 2018. WELL COMPENSATED DISEASE BUT COMPLICATED BY HCC.   COMPLETE IMAGING AT [facility]. FOLLOW UP IN 6 MOS.

## 2017-04-07 DIAGNOSIS — D649 Anemia, unspecified: Secondary | ICD-10-CM | POA: Diagnosis not present

## 2017-04-07 LAB — CBC WITH DIFFERENTIAL/PLATELET
BASOS ABS: 17 {cells}/uL (ref 0–200)
Basophils Relative: 0.3 %
EOS ABS: 108 {cells}/uL (ref 15–500)
Eosinophils Relative: 1.9 %
HCT: 39.1 % (ref 38.5–50.0)
HEMOGLOBIN: 13.9 g/dL (ref 13.2–17.1)
Lymphs Abs: 1037 cells/uL (ref 850–3900)
MCH: 31.6 pg (ref 27.0–33.0)
MCHC: 35.5 g/dL (ref 32.0–36.0)
MCV: 88.9 fL (ref 80.0–100.0)
MONOS PCT: 7.3 %
MPV: 12.8 fL — ABNORMAL HIGH (ref 7.5–12.5)
NEUTROS ABS: 4121 {cells}/uL (ref 1500–7800)
NEUTROS PCT: 72.3 %
Platelets: 92 10*3/uL — ABNORMAL LOW (ref 140–400)
RBC: 4.4 10*6/uL (ref 4.20–5.80)
RDW: 13 % (ref 11.0–15.0)
Total Lymphocyte: 18.2 %
WBC mixed population: 416 cells/uL (ref 200–950)
WBC: 5.7 10*3/uL (ref 3.8–10.8)

## 2017-04-07 LAB — FERRITIN: Ferritin: 110 ng/mL (ref 20–380)

## 2017-04-10 ENCOUNTER — Other Ambulatory Visit: Payer: Self-pay | Admitting: Family Medicine

## 2017-04-14 NOTE — Progress Notes (Signed)
Pt is aware.  

## 2017-04-16 ENCOUNTER — Other Ambulatory Visit: Payer: Self-pay | Admitting: Family Medicine

## 2017-04-16 MED ORDER — EMPAGLIFLOZIN 25 MG PO TABS: 25.0000 mg | ORAL_TABLET | Freq: Every day | ORAL | 1 refills | Status: DC

## 2017-04-22 DIAGNOSIS — T82110A Breakdown (mechanical) of cardiac electrode, initial encounter: Secondary | ICD-10-CM | POA: Diagnosis not present

## 2017-04-22 DIAGNOSIS — Z45018 Encounter for adjustment and management of other part of cardiac pacemaker: Secondary | ICD-10-CM | POA: Diagnosis not present

## 2017-04-22 DIAGNOSIS — C220 Liver cell carcinoma: Secondary | ICD-10-CM | POA: Diagnosis not present

## 2017-04-25 ENCOUNTER — Emergency Department (HOSPITAL_COMMUNITY): Payer: Medicare Other

## 2017-04-25 ENCOUNTER — Emergency Department (HOSPITAL_COMMUNITY)
Admission: EM | Admit: 2017-04-25 | Discharge: 2017-04-25 | Disposition: A | Discharge status: Home / Self Care | Payer: Medicare Other | Attending: Emergency Medicine | Admitting: Emergency Medicine

## 2017-04-25 ENCOUNTER — Encounter (HOSPITAL_COMMUNITY): Payer: Self-pay | Admitting: Emergency Medicine

## 2017-04-25 ENCOUNTER — Other Ambulatory Visit: Payer: Self-pay

## 2017-04-25 DIAGNOSIS — R1011 Right upper quadrant pain: Secondary | ICD-10-CM | POA: Diagnosis not present

## 2017-04-25 DIAGNOSIS — I2510 Atherosclerotic heart disease of native coronary artery without angina pectoris: Secondary | ICD-10-CM | POA: Insufficient documentation

## 2017-04-25 DIAGNOSIS — E119 Type 2 diabetes mellitus without complications: Secondary | ICD-10-CM | POA: Insufficient documentation

## 2017-04-25 DIAGNOSIS — Z79899 Other long term (current) drug therapy: Secondary | ICD-10-CM | POA: Diagnosis not present

## 2017-04-25 DIAGNOSIS — E785 Hyperlipidemia, unspecified: Secondary | ICD-10-CM | POA: Insufficient documentation

## 2017-04-25 DIAGNOSIS — Z7984 Long term (current) use of oral hypoglycemic drugs: Secondary | ICD-10-CM | POA: Diagnosis not present

## 2017-04-25 DIAGNOSIS — R1010 Upper abdominal pain, unspecified: Secondary | ICD-10-CM

## 2017-04-25 DIAGNOSIS — Z950 Presence of cardiac pacemaker: Secondary | ICD-10-CM | POA: Diagnosis not present

## 2017-04-25 DIAGNOSIS — I480 Paroxysmal atrial fibrillation: Secondary | ICD-10-CM | POA: Diagnosis not present

## 2017-04-25 DIAGNOSIS — Z7982 Long term (current) use of aspirin: Secondary | ICD-10-CM | POA: Diagnosis not present

## 2017-04-25 DIAGNOSIS — R0989 Other specified symptoms and signs involving the circulatory and respiratory systems: Secondary | ICD-10-CM | POA: Diagnosis not present

## 2017-04-25 DIAGNOSIS — I252 Old myocardial infarction: Secondary | ICD-10-CM | POA: Diagnosis not present

## 2017-04-25 DIAGNOSIS — I10 Essential (primary) hypertension: Secondary | ICD-10-CM | POA: Insufficient documentation

## 2017-04-25 DIAGNOSIS — Z87891 Personal history of nicotine dependence: Secondary | ICD-10-CM | POA: Diagnosis not present

## 2017-04-25 DIAGNOSIS — R1013 Epigastric pain: Secondary | ICD-10-CM | POA: Diagnosis not present

## 2017-04-25 HISTORY — DX: Unspecified cirrhosis of liver: K74.60

## 2017-04-25 LAB — COMPREHENSIVE METABOLIC PANEL
ALK PHOS: 107 U/L (ref 38–126)
ALT: 25 U/L (ref 17–63)
ANION GAP: 11 (ref 5–15)
AST: 21 U/L (ref 15–41)
Albumin: 4 g/dL (ref 3.5–5.0)
BILIRUBIN TOTAL: 0.7 mg/dL (ref 0.3–1.2)
BUN: 19 mg/dL (ref 6–20)
CO2: 26 mmol/L (ref 22–32)
Calcium: 9.8 mg/dL (ref 8.9–10.3)
Chloride: 102 mmol/L (ref 101–111)
Creatinine, Ser: 0.73 mg/dL (ref 0.61–1.24)
GFR calc Af Amer: >60 mL/min (ref >60)
GFR calc non Af Amer: >60 mL/min (ref >60)
Glucose, Bld: 251 mg/dL — ABNORMAL HIGH (ref 65–99)
Potassium: 4.8 mmol/L (ref 3.5–5.1)
SODIUM: 139 mmol/L (ref 135–145)
Total Protein: 7.8 g/dL (ref 6.5–8.1)

## 2017-04-25 LAB — URINALYSIS, ROUTINE W REFLEX MICROSCOPIC
BACTERIA UA: NONE SEEN
Bilirubin Urine: NEGATIVE
Glucose, UA: >=500 mg/dL — AB
Hgb urine dipstick: NEGATIVE
Ketones, ur: NEGATIVE mg/dL
Leukocytes, UA: NEGATIVE
Nitrite: NEGATIVE
PROTEIN: NEGATIVE mg/dL
SPECIFIC GRAVITY, URINE: 1.033 — AB (ref 1.005–1.030)
pH: 7 (ref 5.0–8.0)

## 2017-04-25 LAB — CBC WITH DIFFERENTIAL/PLATELET
BASOS ABS: 0 10*3/uL (ref 0.0–0.1)
Basophils Relative: 0 %
Eosinophils Absolute: 0.1 10*3/uL (ref 0.0–0.7)
Eosinophils Relative: 2 %
HCT: 41.7 % (ref 39.0–52.0)
HEMOGLOBIN: 13.6 g/dL (ref 13.0–17.0)
LYMPHS ABS: 0.7 10*3/uL (ref 0.7–4.0)
LYMPHS PCT: 11 %
MCH: 30.4 pg (ref 26.0–34.0)
MCHC: 32.6 g/dL (ref 30.0–36.0)
MCV: 93.1 fL (ref 78.0–100.0)
Monocytes Absolute: 0.6 10*3/uL (ref 0.1–1.0)
Monocytes Relative: 10 %
NEUTROS ABS: 4.9 10*3/uL (ref 1.7–7.7)
NEUTROS PCT: 77 %
PLATELETS: 104 10*3/uL — AB (ref 150–400)
RBC: 4.48 MIL/uL (ref 4.22–5.81)
RDW: 13.8 % (ref 11.5–15.5)
WBC: 6.3 10*3/uL (ref 4.0–10.5)

## 2017-04-25 LAB — PROTIME-INR
INR: 0.98
Prothrombin Time: 12.9 seconds (ref 11.4–15.2)

## 2017-04-25 LAB — LIPASE, BLOOD: Lipase: 25 U/L (ref 11–51)

## 2017-04-25 LAB — TROPONIN I

## 2017-04-25 MED ORDER — OXYCODONE HCL 5 MG PO TABS: 5.0000 mg | ORAL_TABLET | Freq: Four times a day (QID) | ORAL | 0 refills | Status: DC | PRN

## 2017-04-25 MED ORDER — IOPAMIDOL (ISOVUE-300) INJECTION 61%
100.0000 mL | Freq: Once | INTRAVENOUS | Status: AC | PRN
  Administered 2017-04-25: 100 mL via INTRAVENOUS

## 2017-04-25 MED ORDER — DICYCLOMINE HCL 20 MG PO TABS: 20.0000 mg | ORAL_TABLET | Freq: Four times a day (QID) | ORAL | 0 refills | Status: DC | PRN

## 2017-04-25 MED ORDER — ONDANSETRON HCL 4 MG/2ML IJ SOLN
4.0000 mg | INTRAMUSCULAR | Status: DC | PRN
  Administered 2017-04-25: 4 mg via INTRAVENOUS
  Filled 2017-04-25: qty 2

## 2017-04-25 MED ORDER — MORPHINE SULFATE (PF) 4 MG/ML IV SOLN
4.0000 mg | INTRAVENOUS | Status: DC | PRN
  Administered 2017-04-25: 4 mg via INTRAVENOUS
  Filled 2017-04-25: qty 1

## 2017-04-25 MED ORDER — FAMOTIDINE 20 MG PO TABS: 20.0000 mg | ORAL_TABLET | Freq: Two times a day (BID) | ORAL | 0 refills | Status: DC

## 2017-04-25 MED ORDER — SODIUM CHLORIDE 0.9 % IV BOLUS (SEPSIS)
500.0000 mL | Freq: Once | INTRAVENOUS | Status: AC
  Administered 2017-04-25: 500 mL via INTRAVENOUS

## 2017-04-25 NOTE — ED Triage Notes (Signed)
Patient reports Right upper quadrant pain that radiates into his back. Patient was diagnosed with liver cancer 2 years ago. Had an MRI on Wed, to see doctor on March 28 in [place]. Patient reports severe pain that started approx 3 hours ago.

## 2017-04-25 NOTE — Discharge Instructions (Addendum)
Take the prescriptions as directed. Eat a bland diet, avoiding greasy, fatty, fried foods, as well as spicy and acidic foods or beverages.  Avoid eating within 2 to 3 hours before going to bed or laying down.  Also avoid teas, colas, coffee, chocolate, pepermint and spearment. Call your regular GI doctor on Monday to schedule a follow up appointment this week.  Return to the Emergency Department immediately if worsening.
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
## 2017-04-25 NOTE — ED Notes (Signed)
Pt given water to drink. Pt also up and ambulatory to the restroom.

## 2017-04-28 ENCOUNTER — Encounter: Payer: Self-pay | Admitting: Family Medicine

## 2017-04-28 ENCOUNTER — Ambulatory Visit (INDEPENDENT_AMBULATORY_CARE_PROVIDER_SITE_OTHER): Payer: Medicare Other | Admitting: Family Medicine

## 2017-04-28 VITALS — BP 120/58 | HR 83 | Temp 97.9°F | Resp 12 | Ht 70.0 in | Wt 180.0 lb

## 2017-04-28 DIAGNOSIS — K8050 Calculus of bile duct without cholangitis or cholecystitis without obstruction: Secondary | ICD-10-CM | POA: Diagnosis not present

## 2017-04-28 MED ORDER — ONDANSETRON HCL 4 MG PO TABS: 4.0000 mg | ORAL_TABLET | Freq: Three times a day (TID) | ORAL | 0 refills | Status: DC | PRN
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
## 2017-04-29 ENCOUNTER — Ambulatory Visit
Admission: RE | Admit: 2017-04-29 | Discharge: 2017-04-29 | Disposition: A | Discharge status: Home / Self Care | Payer: Medicare Other | Source: Ambulatory Visit | Attending: Family Medicine | Admitting: Family Medicine

## 2017-04-29 DIAGNOSIS — K8050 Calculus of bile duct without cholangitis or cholecystitis without obstruction: Secondary | ICD-10-CM

## 2017-04-29 DIAGNOSIS — K7460 Unspecified cirrhosis of liver: Secondary | ICD-10-CM | POA: Diagnosis not present

## 2017-04-30 ENCOUNTER — Encounter (HOSPITAL_COMMUNITY): Payer: Self-pay | Admitting: Emergency Medicine

## 2017-04-30 ENCOUNTER — Other Ambulatory Visit: Payer: Self-pay

## 2017-04-30 ENCOUNTER — Other Ambulatory Visit: Payer: Self-pay | Admitting: Family Medicine

## 2017-04-30 DIAGNOSIS — Z7982 Long term (current) use of aspirin: Secondary | ICD-10-CM

## 2017-04-30 DIAGNOSIS — Z950 Presence of cardiac pacemaker: Secondary | ICD-10-CM

## 2017-04-30 DIAGNOSIS — N400 Enlarged prostate without lower urinary tract symptoms: Secondary | ICD-10-CM | POA: Diagnosis present

## 2017-04-30 DIAGNOSIS — Z955 Presence of coronary angioplasty implant and graft: Secondary | ICD-10-CM

## 2017-04-30 DIAGNOSIS — B182 Chronic viral hepatitis C: Secondary | ICD-10-CM | POA: Diagnosis present

## 2017-04-30 DIAGNOSIS — Z79891 Long term (current) use of opiate analgesic: Secondary | ICD-10-CM

## 2017-04-30 DIAGNOSIS — K7460 Unspecified cirrhosis of liver: Secondary | ICD-10-CM | POA: Diagnosis present

## 2017-04-30 DIAGNOSIS — R251 Tremor, unspecified: Secondary | ICD-10-CM | POA: Diagnosis not present

## 2017-04-30 DIAGNOSIS — R932 Abnormal findings on diagnostic imaging of liver and biliary tract: Secondary | ICD-10-CM

## 2017-04-30 DIAGNOSIS — Z87891 Personal history of nicotine dependence: Secondary | ICD-10-CM

## 2017-04-30 DIAGNOSIS — Z86010 Personal history of colonic polyps: Secondary | ICD-10-CM

## 2017-04-30 DIAGNOSIS — R112 Nausea with vomiting, unspecified: Secondary | ICD-10-CM | POA: Diagnosis not present

## 2017-04-30 DIAGNOSIS — K811 Chronic cholecystitis: Principal | ICD-10-CM | POA: Diagnosis present

## 2017-04-30 DIAGNOSIS — Z7984 Long term (current) use of oral hypoglycemic drugs: Secondary | ICD-10-CM

## 2017-04-30 DIAGNOSIS — C220 Liver cell carcinoma: Secondary | ICD-10-CM | POA: Diagnosis present

## 2017-04-30 DIAGNOSIS — I252 Old myocardial infarction: Secondary | ICD-10-CM

## 2017-04-30 DIAGNOSIS — F1990 Other psychoactive substance use, unspecified, uncomplicated: Secondary | ICD-10-CM | POA: Diagnosis present

## 2017-04-30 DIAGNOSIS — E538 Deficiency of other specified B group vitamins: Secondary | ICD-10-CM | POA: Diagnosis present

## 2017-04-30 DIAGNOSIS — H9193 Unspecified hearing loss, bilateral: Secondary | ICD-10-CM | POA: Diagnosis present

## 2017-04-30 DIAGNOSIS — K219 Gastro-esophageal reflux disease without esophagitis: Secondary | ICD-10-CM | POA: Diagnosis present

## 2017-04-30 DIAGNOSIS — I119 Hypertensive heart disease without heart failure: Secondary | ICD-10-CM | POA: Diagnosis present

## 2017-04-30 DIAGNOSIS — E860 Dehydration: Secondary | ICD-10-CM | POA: Diagnosis present

## 2017-04-30 DIAGNOSIS — E119 Type 2 diabetes mellitus without complications: Secondary | ICD-10-CM | POA: Diagnosis present

## 2017-04-30 DIAGNOSIS — Z79899 Other long term (current) drug therapy: Secondary | ICD-10-CM

## 2017-04-30 DIAGNOSIS — I480 Paroxysmal atrial fibrillation: Secondary | ICD-10-CM | POA: Diagnosis present

## 2017-04-30 DIAGNOSIS — D638 Anemia in other chronic diseases classified elsewhere: Secondary | ICD-10-CM | POA: Diagnosis present

## 2017-04-30 DIAGNOSIS — I25810 Atherosclerosis of coronary artery bypass graft(s) without angina pectoris: Secondary | ICD-10-CM | POA: Diagnosis present

## 2017-04-30 DIAGNOSIS — R1011 Right upper quadrant pain: Secondary | ICD-10-CM | POA: Diagnosis not present

## 2017-04-30 DIAGNOSIS — E785 Hyperlipidemia, unspecified: Secondary | ICD-10-CM | POA: Diagnosis present

## 2017-04-30 LAB — CBC
HCT: 36.7 % — ABNORMAL LOW (ref 39.0–52.0)
Hemoglobin: 12.6 g/dL — ABNORMAL LOW (ref 13.0–17.0)
MCH: 30.7 pg (ref 26.0–34.0)
MCHC: 34.3 g/dL (ref 30.0–36.0)
MCV: 89.5 fL (ref 78.0–100.0)
PLATELETS: 123 10*3/uL — AB (ref 150–400)
RBC: 4.1 MIL/uL — AB (ref 4.22–5.81)
RDW: 13.3 % (ref 11.5–15.5)
WBC: 8.2 10*3/uL (ref 4.0–10.5)

## 2017-04-30 LAB — COMPREHENSIVE METABOLIC PANEL
ALK PHOS: 128 U/L — AB (ref 38–126)
ALT: 40 U/L (ref 17–63)
AST: 47 U/L — AB (ref 15–41)
Albumin: 3.3 g/dL — ABNORMAL LOW (ref 3.5–5.0)
Anion gap: 14 (ref 5–15)
BILIRUBIN TOTAL: 1.4 mg/dL — AB (ref 0.3–1.2)
BUN: 17 mg/dL (ref 6–20)
CO2: 21 mmol/L — ABNORMAL LOW (ref 22–32)
CREATININE: 0.75 mg/dL (ref 0.61–1.24)
Calcium: 8.9 mg/dL (ref 8.9–10.3)
Chloride: 99 mmol/L — ABNORMAL LOW (ref 101–111)
Glucose, Bld: 190 mg/dL — ABNORMAL HIGH (ref 65–99)
Potassium: 3.8 mmol/L (ref 3.5–5.1)
Sodium: 134 mmol/L — ABNORMAL LOW (ref 135–145)
Total Protein: 7 g/dL (ref 6.5–8.1)

## 2017-04-30 LAB — URINALYSIS, ROUTINE W REFLEX MICROSCOPIC
Bilirubin Urine: NEGATIVE
KETONES UR: NEGATIVE mg/dL
LEUKOCYTES UA: NEGATIVE
Nitrite: NEGATIVE
PH: 6 (ref 5.0–8.0)
PROTEIN: NEGATIVE mg/dL
Specific Gravity, Urine: 1.037 — ABNORMAL HIGH (ref 1.005–1.030)

## 2017-04-30 LAB — LIPASE, BLOOD: LIPASE: 24 U/L (ref 11–51)

## 2017-04-30 NOTE — ED Triage Notes (Signed)
Pt reports R sided abdominal pain for about a week, seen at [facility] over the weekend for same. Followed up with PCP who performed ultrasound and found "Blockage" in gallbladder per wife at bedside. Pt referred to GI surgeon but has not seen him yet. Came to ED for continued pain. Pain located in R abdomen, radiating to back. Worse with deep breaths.

## 2017-05-01 ENCOUNTER — Inpatient Hospital Stay (HOSPITAL_COMMUNITY): Payer: Medicare Other

## 2017-05-01 ENCOUNTER — Encounter (HOSPITAL_COMMUNITY): Payer: Self-pay | Admitting: Physician Assistant

## 2017-05-01 ENCOUNTER — Inpatient Hospital Stay (HOSPITAL_COMMUNITY)
Admission: EM | Admit: 2017-05-01 | Discharge: 2017-05-03 | DRG: 445 | Disposition: A | Discharge status: Home / Self Care | Payer: Medicare Other | Attending: Internal Medicine | Admitting: Internal Medicine

## 2017-05-01 ENCOUNTER — Telehealth: Payer: Self-pay | Admitting: Gastroenterology

## 2017-05-01 DIAGNOSIS — B182 Chronic viral hepatitis C: Secondary | ICD-10-CM | POA: Diagnosis present

## 2017-05-01 DIAGNOSIS — Z87891 Personal history of nicotine dependence: Secondary | ICD-10-CM | POA: Diagnosis not present

## 2017-05-01 DIAGNOSIS — K7460 Unspecified cirrhosis of liver: Secondary | ICD-10-CM

## 2017-05-01 DIAGNOSIS — R1010 Upper abdominal pain, unspecified: Secondary | ICD-10-CM

## 2017-05-01 DIAGNOSIS — Z950 Presence of cardiac pacemaker: Secondary | ICD-10-CM | POA: Diagnosis present

## 2017-05-01 DIAGNOSIS — N400 Enlarged prostate without lower urinary tract symptoms: Secondary | ICD-10-CM | POA: Diagnosis present

## 2017-05-01 DIAGNOSIS — F1990 Other psychoactive substance use, unspecified, uncomplicated: Secondary | ICD-10-CM | POA: Diagnosis present

## 2017-05-01 DIAGNOSIS — K219 Gastro-esophageal reflux disease without esophagitis: Secondary | ICD-10-CM | POA: Diagnosis present

## 2017-05-01 DIAGNOSIS — E538 Deficiency of other specified B group vitamins: Secondary | ICD-10-CM | POA: Diagnosis present

## 2017-05-01 DIAGNOSIS — E119 Type 2 diabetes mellitus without complications: Secondary | ICD-10-CM

## 2017-05-01 DIAGNOSIS — I2510 Atherosclerotic heart disease of native coronary artery without angina pectoris: Secondary | ICD-10-CM | POA: Diagnosis not present

## 2017-05-01 DIAGNOSIS — I25810 Atherosclerosis of coronary artery bypass graft(s) without angina pectoris: Secondary | ICD-10-CM | POA: Diagnosis present

## 2017-05-01 DIAGNOSIS — Z79891 Long term (current) use of opiate analgesic: Secondary | ICD-10-CM | POA: Diagnosis not present

## 2017-05-01 DIAGNOSIS — Z7984 Long term (current) use of oral hypoglycemic drugs: Secondary | ICD-10-CM | POA: Diagnosis not present

## 2017-05-01 DIAGNOSIS — N401 Enlarged prostate with lower urinary tract symptoms: Secondary | ICD-10-CM | POA: Diagnosis not present

## 2017-05-01 DIAGNOSIS — I10 Essential (primary) hypertension: Secondary | ICD-10-CM | POA: Diagnosis present

## 2017-05-01 DIAGNOSIS — E860 Dehydration: Secondary | ICD-10-CM | POA: Diagnosis present

## 2017-05-01 DIAGNOSIS — I119 Hypertensive heart disease without heart failure: Secondary | ICD-10-CM | POA: Diagnosis present

## 2017-05-01 DIAGNOSIS — E785 Hyperlipidemia, unspecified: Secondary | ICD-10-CM | POA: Diagnosis present

## 2017-05-01 DIAGNOSIS — Z955 Presence of coronary angioplasty implant and graft: Secondary | ICD-10-CM | POA: Diagnosis not present

## 2017-05-01 DIAGNOSIS — Z9861 Coronary angioplasty status: Secondary | ICD-10-CM | POA: Diagnosis not present

## 2017-05-01 DIAGNOSIS — Z79899 Other long term (current) drug therapy: Secondary | ICD-10-CM | POA: Diagnosis not present

## 2017-05-01 DIAGNOSIS — H9193 Unspecified hearing loss, bilateral: Secondary | ICD-10-CM | POA: Diagnosis present

## 2017-05-01 DIAGNOSIS — N138 Other obstructive and reflux uropathy: Secondary | ICD-10-CM | POA: Diagnosis not present

## 2017-05-01 DIAGNOSIS — Z7982 Long term (current) use of aspirin: Secondary | ICD-10-CM | POA: Diagnosis not present

## 2017-05-01 DIAGNOSIS — R109 Unspecified abdominal pain: Secondary | ICD-10-CM | POA: Diagnosis not present

## 2017-05-01 DIAGNOSIS — K210 Gastro-esophageal reflux disease with esophagitis: Secondary | ICD-10-CM | POA: Diagnosis not present

## 2017-05-01 DIAGNOSIS — I252 Old myocardial infarction: Secondary | ICD-10-CM | POA: Diagnosis not present

## 2017-05-01 DIAGNOSIS — R1011 Right upper quadrant pain: Secondary | ICD-10-CM

## 2017-05-01 DIAGNOSIS — D649 Anemia, unspecified: Secondary | ICD-10-CM | POA: Diagnosis present

## 2017-05-01 DIAGNOSIS — K8000 Calculus of gallbladder with acute cholecystitis without obstruction: Secondary | ICD-10-CM | POA: Diagnosis not present

## 2017-05-01 DIAGNOSIS — E0800 Diabetes mellitus due to underlying condition with hyperosmolarity without nonketotic hyperglycemic-hyperosmolar coma (NKHHC): Secondary | ICD-10-CM | POA: Diagnosis not present

## 2017-05-01 DIAGNOSIS — I480 Paroxysmal atrial fibrillation: Secondary | ICD-10-CM | POA: Diagnosis present

## 2017-05-01 DIAGNOSIS — D638 Anemia in other chronic diseases classified elsewhere: Secondary | ICD-10-CM | POA: Diagnosis present

## 2017-05-01 DIAGNOSIS — K810 Acute cholecystitis: Secondary | ICD-10-CM

## 2017-05-01 DIAGNOSIS — C220 Liver cell carcinoma: Secondary | ICD-10-CM | POA: Diagnosis present

## 2017-05-01 DIAGNOSIS — K811 Chronic cholecystitis: Secondary | ICD-10-CM | POA: Diagnosis present

## 2017-05-01 LAB — GLUCOSE, CAPILLARY: GLUCOSE-CAPILLARY: 107 mg/dL — AB (ref 65–99)

## 2017-05-01 LAB — APTT: aPTT: 37 seconds — ABNORMAL HIGH (ref 24–36)

## 2017-05-01 LAB — PROTIME-INR
INR: 1.09
PROTHROMBIN TIME: 14 s (ref 11.4–15.2)

## 2017-05-01 LAB — TROPONIN I: Troponin I: <0.03 ng/mL (ref <0.03)

## 2017-05-01 LAB — HEMOGLOBIN A1C
Hgb A1c MFr Bld: 6.8 % — ABNORMAL HIGH (ref 4.8–5.6)
Mean Plasma Glucose: 148.46 mg/dL

## 2017-05-01 MED ORDER — MORPHINE SULFATE (PF) 4 MG/ML IV SOLN
4.0000 mg | Freq: Once | INTRAVENOUS | Status: AC
  Administered 2017-05-01: 4 mg via INTRAVENOUS
  Filled 2017-05-01: qty 1

## 2017-05-01 MED ORDER — SODIUM CHLORIDE 0.9 % IV SOLN
INTRAVENOUS | Status: DC
  Administered 2017-05-01 – 2017-05-03 (×4): via INTRAVENOUS

## 2017-05-01 MED ORDER — FAMOTIDINE 20 MG PO TABS
20.0000 mg | ORAL_TABLET | Freq: Two times a day (BID) | ORAL | Status: DC
  Administered 2017-05-01 – 2017-05-03 (×4): 20 mg via ORAL
  Filled 2017-05-01 (×5): qty 1

## 2017-05-01 MED ORDER — MORPHINE SULFATE (PF) 4 MG/ML IV SOLN
INTRAVENOUS | Status: AC
  Administered 2017-05-01: 3 mg via INTRAVENOUS
  Filled 2017-05-01: qty 1

## 2017-05-01 MED ORDER — SENNOSIDES-DOCUSATE SODIUM 8.6-50 MG PO TABS: 1.0000 | ORAL_TABLET | Freq: Every evening | ORAL | Status: DC | PRN

## 2017-05-01 MED ORDER — LISINOPRIL 5 MG PO TABS
2.5000 mg | ORAL_TABLET | Freq: Every day | ORAL | Status: DC
  Administered 2017-05-02 – 2017-05-03 (×2): 2.5 mg via ORAL
  Filled 2017-05-01 (×3): qty 1

## 2017-05-01 MED ORDER — ISOSORBIDE MONONITRATE ER 60 MG PO TB24
60.0000 mg | ORAL_TABLET | Freq: Two times a day (BID) | ORAL | Status: DC
  Administered 2017-05-01 – 2017-05-03 (×4): 60 mg via ORAL
  Filled 2017-05-01: qty 2
  Filled 2017-05-01: qty 1
  Filled 2017-05-01: qty 2
  Filled 2017-05-01: qty 1

## 2017-05-01 MED ORDER — MORPHINE SULFATE (PF) 4 MG/ML IV SOLN
3.0000 mg | Freq: Once | INTRAVENOUS | Status: AC
  Administered 2017-05-01: 3 mg via INTRAVENOUS

## 2017-05-01 MED ORDER — PIPERACILLIN-TAZOBACTAM 3.375 G IVPB
3.3750 g | Freq: Three times a day (TID) | INTRAVENOUS | Status: DC
  Administered 2017-05-01 – 2017-05-03 (×5): 3.375 g via INTRAVENOUS
  Filled 2017-05-01 (×8): qty 50

## 2017-05-01 MED ORDER — FOLIC ACID 1 MG PO TABS
1.0000 mg | ORAL_TABLET | Freq: Every day | ORAL | Status: DC
  Administered 2017-05-02 – 2017-05-03 (×2): 1 mg via ORAL
  Filled 2017-05-01 (×2): qty 1

## 2017-05-01 MED ORDER — CLOTRIMAZOLE 1 % EX CREA
1.0000 "application " | TOPICAL_CREAM | Freq: Two times a day (BID) | CUTANEOUS | Status: DC
  Filled 2017-05-01: qty 15

## 2017-05-01 MED ORDER — PANTOPRAZOLE SODIUM 40 MG PO TBEC
40.0000 mg | DELAYED_RELEASE_TABLET | Freq: Every day | ORAL | Status: DC
  Administered 2017-05-02 – 2017-05-03 (×2): 40 mg via ORAL
  Filled 2017-05-01 (×2): qty 1

## 2017-05-01 MED ORDER — ONDANSETRON HCL 4 MG PO TABS: 4.0000 mg | ORAL_TABLET | Freq: Three times a day (TID) | ORAL | Status: DC | PRN

## 2017-05-01 MED ORDER — OXYCODONE HCL 5 MG PO TABS
5.0000 mg | ORAL_TABLET | ORAL | Status: DC | PRN
  Administered 2017-05-02 – 2017-05-03 (×4): 5 mg via ORAL
  Filled 2017-05-01 (×4): qty 1

## 2017-05-01 MED ORDER — GI COCKTAIL ~~LOC~~
30.0000 mL | Freq: Once | ORAL | Status: AC
  Administered 2017-05-01: 30 mL via ORAL
  Filled 2017-05-01: qty 30

## 2017-05-01 MED ORDER — TERAZOSIN HCL 5 MG PO CAPS
5.0000 mg | ORAL_CAPSULE | Freq: Every evening | ORAL | Status: DC
  Administered 2017-05-01 – 2017-05-02 (×2): 5 mg via ORAL
  Filled 2017-05-01 (×4): qty 1

## 2017-05-01 MED ORDER — PIPERACILLIN-TAZOBACTAM 3.375 G IVPB 30 MIN
3.3750 g | Freq: Once | INTRAVENOUS | Status: AC
  Administered 2017-05-01: 3.375 g via INTRAVENOUS
  Filled 2017-05-01: qty 50

## 2017-05-01 MED ORDER — METOPROLOL TARTRATE 50 MG PO TABS
50.0000 mg | ORAL_TABLET | Freq: Two times a day (BID) | ORAL | Status: DC
  Administered 2017-05-01 – 2017-05-03 (×3): 50 mg via ORAL
  Filled 2017-05-01 (×2): qty 2
  Filled 2017-05-01 (×2): qty 1

## 2017-05-01 MED ORDER — TECHNETIUM TC 99M MEBROFENIN IV KIT
5.0200 | PACK | Freq: Once | INTRAVENOUS | Status: AC | PRN
  Administered 2017-05-01: 5.02 via INTRAVENOUS

## 2017-05-01 MED ORDER — BISACODYL 10 MG RE SUPP: 10.0000 mg | Freq: Every day | RECTAL | Status: DC | PRN

## 2017-05-01 MED ORDER — MORPHINE SULFATE (PF) 4 MG/ML IV SOLN: 1.0000 mg | INTRAVENOUS | Status: DC | PRN

## 2017-05-01 MED ORDER — INSULIN ASPART 100 UNIT/ML ~~LOC~~ SOLN
0.0000 [IU] | Freq: Three times a day (TID) | SUBCUTANEOUS | Status: DC
  Administered 2017-05-03: 1 [IU] via SUBCUTANEOUS

## 2017-05-01 MED ORDER — MORPHINE SULFATE (PF) 4 MG/ML IV SOLN
1.0000 mg | INTRAVENOUS | Status: DC | PRN
  Administered 2017-05-02: 1 mg via INTRAVENOUS
  Filled 2017-05-01: qty 1

## 2017-05-01 MED ORDER — NITROGLYCERIN 0.4 MG SL SUBL: 0.4000 mg | SUBLINGUAL_TABLET | SUBLINGUAL | Status: DC | PRN

## 2017-05-01 MED ORDER — SODIUM CHLORIDE 0.9 % IV BOLUS
1000.0000 mL | Freq: Once | INTRAVENOUS | Status: AC
  Administered 2017-05-01: 1000 mL via INTRAVENOUS

## 2017-05-01 NOTE — ED Notes (Signed)
Patient is back from [facility]uc [facility].
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
## 2017-05-01 NOTE — ED Notes (Signed)
Patient is still in [place]
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
## 2017-05-01 NOTE — ED Notes (Signed)
Off the floor
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
## 2017-05-01 NOTE — Telephone Encounter (Signed)
Called BY PA TO DISCUSS CASE. PT WITH SYMPTOMS OF BILIARY COLIC. CT/US SHOWS DISTENDED GB. HE CALLED SURGERY AND THEY SAID IT WAS DUE TO HIS LIVER DISEASE AND IF H NEEDED INTERVENTION IT WOULD BE A CHOLECYSTOSTOMY TUBE BECAUSE HE'S NOT A CANDIDATE FOR SURGERY.  CALLED TO GET MY RECOMMENDATIONS. I PERSONALLY REVIEWED THE CT AND U/S MAR 2019 WITH DR. Thornton Papas. GB WALL ABNL IN SETTING OF LIVER DISEASE BUT CAN'T BE EXPLAINED BY LIVER DISEASE BECAUSE PT HAS NO ASCITES OR PERIHEPATIC FLUID COLLECTIONS.   8984 SPOKE TO MR.Rona Ravens. DISCUSSED THE CASE AGAIN. RECOMMENDED ADMISSION IF PT HAVING NAUSEA/VOMITING AND CAN'T TOLERATE POs AND CONTACT SURGERY AGAIN FOR DEFINITIVE MANAGEMENT OF THE PT'S GALLBLADDER. Marland Kitchen

## 2017-05-01 NOTE — ED Notes (Signed)
Paged General Surg for Dr. Aggie Moats

## 2017-05-01 NOTE — ED Notes (Signed)
Per NUC-MED, patient will be taken to testing around 1600 due last does of pain med. He is not to eat anything until after testing.  Meds not given r/t to NPO and Nuc-med  Patient is aware of ordered testing. All questions answered for now

## 2017-05-01 NOTE — ED Provider Notes (Signed)
Medical screening examination/treatment/procedure(s) were conducted as a shared visit with non-physician practitioner(s) and myself.  I personally evaluated the patient during the encounter.  None She has complex medical history.  He has been having almost 3 weeks of recurrent and persistent upper right abdominal pain.  Patient is being evaluated and treated at [facility] for hepatocellular carcinoma.  Due to ongoing pain he had a CT scan last week at [facility] and an ultrasound by his PCP.  These were exhibiting gallbladder distention.  He denies there is any plan in place with his gastroenterologist who manages hepatocellular carcinoma to further evaluate this.  Per patient and family report, he was told that his pain is not related to his hepatocellular carcinoma.  They are here however because he continues to have severe pain.  Patient is alert and nontoxic.  No respiratory distress.  Heart is regular.  Lungs are grossly clear.  Patient does endorse pain to palpation in the epigastrium and right upper quadrant.  I agree with plan and management.   Charlesetta Shanks, MD 05/09/17 [phone]
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
## 2017-05-02 ENCOUNTER — Inpatient Hospital Stay (HOSPITAL_COMMUNITY): Payer: Medicare Other

## 2017-05-02 ENCOUNTER — Encounter (HOSPITAL_COMMUNITY): Payer: Self-pay | Admitting: Physician Assistant

## 2017-05-02 DIAGNOSIS — N138 Other obstructive and reflux uropathy: Secondary | ICD-10-CM

## 2017-05-02 DIAGNOSIS — I2510 Atherosclerotic heart disease of native coronary artery without angina pectoris: Secondary | ICD-10-CM

## 2017-05-02 DIAGNOSIS — Z9861 Coronary angioplasty status: Secondary | ICD-10-CM

## 2017-05-02 DIAGNOSIS — N401 Enlarged prostate with lower urinary tract symptoms: Secondary | ICD-10-CM

## 2017-05-02 DIAGNOSIS — K219 Gastro-esophageal reflux disease without esophagitis: Secondary | ICD-10-CM

## 2017-05-02 DIAGNOSIS — R1011 Right upper quadrant pain: Secondary | ICD-10-CM

## 2017-05-02 HISTORY — PX: IR PERC CHOLECYSTOSTOMY: IMG2326

## 2017-05-02 LAB — COMPREHENSIVE METABOLIC PANEL
ALT: 44 U/L (ref 17–63)
AST: 38 U/L (ref 15–41)
Albumin: 2.5 g/dL — ABNORMAL LOW (ref 3.5–5.0)
Alkaline Phosphatase: 148 U/L — ABNORMAL HIGH (ref 38–126)
Anion gap: 14 (ref 5–15)
BILIRUBIN TOTAL: 2.2 mg/dL — AB (ref 0.3–1.2)
BUN: 17 mg/dL (ref 6–20)
CO2: 20 mmol/L — ABNORMAL LOW (ref 22–32)
CREATININE: 0.9 mg/dL (ref 0.61–1.24)
Calcium: 8.5 mg/dL — ABNORMAL LOW (ref 8.9–10.3)
Chloride: 102 mmol/L (ref 101–111)
GFR calc Af Amer: >60 mL/min (ref >60)
Glucose, Bld: 96 mg/dL (ref 65–99)
POTASSIUM: 4.2 mmol/L (ref 3.5–5.1)
Sodium: 136 mmol/L (ref 135–145)
TOTAL PROTEIN: 6 g/dL — AB (ref 6.5–8.1)

## 2017-05-02 LAB — PROTIME-INR
INR: 1.13
PROTHROMBIN TIME: 14.5 s (ref 11.4–15.2)

## 2017-05-02 LAB — CBC
HCT: 35.6 % — ABNORMAL LOW (ref 39.0–52.0)
HEMOGLOBIN: 11.6 g/dL — AB (ref 13.0–17.0)
MCH: 30.1 pg (ref 26.0–34.0)
MCHC: 32.6 g/dL (ref 30.0–36.0)
MCV: 92.5 fL (ref 78.0–100.0)
Platelets: 126 10*3/uL — ABNORMAL LOW (ref 150–400)
RBC: 3.85 MIL/uL — ABNORMAL LOW (ref 4.22–5.81)
RDW: 13.5 % (ref 11.5–15.5)
WBC: 5.5 10*3/uL (ref 4.0–10.5)

## 2017-05-02 LAB — LIPASE, BLOOD: LIPASE: 18 U/L (ref 11–51)

## 2017-05-02 LAB — GLUCOSE, CAPILLARY
Glucose-Capillary: 96 mg/dL (ref 65–99)
Glucose-Capillary: 105 mg/dL — ABNORMAL HIGH (ref 65–99)
Glucose-Capillary: 119 mg/dL — ABNORMAL HIGH (ref 65–99)
Glucose-Capillary: 118 mg/dL — ABNORMAL HIGH (ref 65–99)

## 2017-05-02 MED ORDER — FENTANYL CITRATE (PF) 100 MCG/2ML IJ SOLN
INTRAMUSCULAR | Status: AC
  Filled 2017-05-02: qty 2

## 2017-05-02 MED ORDER — FENTANYL CITRATE (PF) 100 MCG/2ML IJ SOLN
INTRAMUSCULAR | Status: AC | PRN
  Administered 2017-05-02 (×2): 50 ug via INTRAVENOUS

## 2017-05-02 MED ORDER — LIDOCAINE HCL 1 % IJ SOLN
INTRAMUSCULAR | Status: AC
  Filled 2017-05-02: qty 20

## 2017-05-02 MED ORDER — MIDAZOLAM HCL 2 MG/2ML IJ SOLN
INTRAMUSCULAR | Status: AC | PRN
  Administered 2017-05-02 (×2): 1 mg via INTRAVENOUS

## 2017-05-02 MED ORDER — MIDAZOLAM HCL 2 MG/2ML IJ SOLN
INTRAMUSCULAR | Status: AC
  Filled 2017-05-02: qty 6

## 2017-05-02 MED ORDER — IOPAMIDOL (ISOVUE-300) INJECTION 61%
INTRAVENOUS | Status: AC
  Administered 2017-05-02: 10 mL
  Filled 2017-05-02: qty 50

## 2017-05-02 MED ORDER — SODIUM CHLORIDE 0.9% FLUSH
5.0000 mL | Freq: Three times a day (TID) | INTRAVENOUS | Status: DC
  Administered 2017-05-02 – 2017-05-03 (×3): 5 mL

## 2017-05-02 MED ORDER — LIDOCAINE HCL 1 % IJ SOLN
INTRAMUSCULAR | Status: AC | PRN
  Administered 2017-05-02: 2 mL

## 2017-05-02 NOTE — Progress Notes (Addendum)
   Subjective/Chief Complaint: Pt with less pain this AM HIDA + last night   Objective: Vital signs in last 24 hours: Temp:  [97.9 F (36.6 C)-98.1 F (36.7 C)] 97.9 F (36.6 C) (03/30 0449) Pulse Rate:  [58-68] 61 (03/30 0449) Resp:  [16-24] 22 (03/30 0449) BP: (121-172)/(67-84) 121/67 (03/30 0449) SpO2:  [95 %-100 %] 97 % (03/30 0449) Weight:  [81.6 kg (179 lb 15.7 oz)-81.6 kg (180 lb)] 81.6 kg (179 lb 15.7 oz) (03/30 0449) Last BM Date: 04/30/17  Intake/Output from previous day: 03/29 0701 - 03/30 0700 In: 1318.3 [I.V.:1218.3; IV Piggyback:100] Out: 300 [Urine:300] Intake/Output this shift: No intake/output data recorded.  Constitutional: No acute distress, conversant, appears states age. Eyes: Anicteric sclerae, moist conjunctiva, no lid lag Lungs: Clear to auscultation bilaterally, normal respiratory effort CV: regular rate and rhythm, no murmurs, no peripheral edema, pedal pulses 2+ GI: Soft, no masses or hepatosplenomegaly, TTP RUQ, no rebound/guarding Skin: No rashes, palpation reveals normal turgor Psychiatric: appropriate judgment and insight, oriented to person, place, and time   Lab Results:  Recent Labs    04/30/17 2126 05/02/17 0542  WBC 8.2 5.5  HGB 12.6* 11.6*  HCT 36.7* 35.6*  PLT 123* 126*   BMET Recent Labs    04/30/17 2126 05/02/17 0542  NA 134* 136  K 3.8 4.2  CL 99* 102  CO2 21* 20*  GLUCOSE 190* 96  BUN 17 17  CREATININE 0.75 0.90  CALCIUM 8.9 8.5*   PT/INR Recent Labs    05/01/17 1523 05/02/17 0542  LABPROT 14.0 14.5  INR 1.09 1.13   ABG No results for input(s): PHART, HCO3 in the last 72 hours.  Invalid input(s): PCO2, PO2  Studies/Results: Nm Hepatobiliary Liver Func  Result Date: 05/01/2017 CLINICAL DATA:  Right upper quadrant pain EXAM: NUCLEAR MEDICINE HEPATOBILIARY IMAGING TECHNIQUE: Sequential images of the abdomen were obtained out to 60 minutes following intravenous administration of radiopharmaceutical.  RADIOPHARMACEUTICALS:  5.02  mCi Tc-23m  Choletec IV 3 mg of intravenous morphine COMPARISON:  Ultrasound 04/29/2017, CT 04/25/2017 FINDINGS: Normal excretion of radiotracer into the intestinal tract. Homogeneous activity in the liver. Nonvisualized gallbladder at 60 minutes. No gallbladder visualization following morphine augmentation. IMPRESSION: Nonvisualized gallbladder despite morphine augmentation, concerning for acute cholecystitis. Normal biliary to bowel excretion. Electronically Signed   By: Donavan Foil M.D.   On: 05/01/2017 19:00    Anti-infectives: Anti-infectives (From admission, onward)   Start     Dose/Rate Route Frequency Ordered Stop   05/01/17 2200  piperacillin-tazobactam (ZOSYN) IVPB 3.375 g     3.375 g 12.5 mL/hr over 240 Minutes Intravenous Every 8 hours 05/01/17 1502     05/01/17 1500  piperacillin-tazobactam (ZOSYN) IVPB 3.375 g     3.375 g 100 mL/hr over 30 Minutes Intravenous  Once 05/01/17 1449 05/01/17 1608      Assessment/Plan: DM HTN H/o hepatitis C HLD Liver cirrhosis - followed by Dr. Oneida Alar GI in [place] CAD s/p cardiac stent - cardiologist Dr. [name] in [place] H/o pacemaker placement Hepatocellular carcinoma - followed by Dr. Pecola Lawless at [facility], planning microwave ablation of liver and TACE for new/enlarging liver lesions Acute cholecystitis   Plan: As per Dr. Darrel Hoover note yesterday, would rec IR Perc Chole tube placement as pt with mult comorbidities and poor surgical candidate. 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  F/u: Dr. Dalbert Batman   LOS: 1 day    Rosario Jacks., [facility] 05/02/2017
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
## 2017-05-02 NOTE — Progress Notes (Signed)
Patient ID: [name] [name], male   DOB: [birth year], 77 y.o.   MRN: [id number]  PROGRESS NOTE    [name] [name]  DPO:[id number] DOB: 10-22-1940 DOA: 05/01/2017   PCP: [name], MD   Outpatient Specialists:   Brief Narrative: patient is 77 year old Hispanic male with history of diabetes hypertension and liver cirrhosis and hepatitis C who was admitted with acute cholecystitis.  Patient has been seen by Surgery and phone contact with  gastroenterology and is not a candidate for surgical intervention. Patient with 1 month of worsening RUQ/epigastric pain in the setting of hepatocellular carcinoma for which he is planning to undergo microwave ablation of liver and TACE for new/enlarging liver lesions   Recommendation has been for percutaneous cholecystostomy tube insertion.  Assessment & Plan:   Principal Problem:   Abdominal pain Active Problems:   HLD (hyperlipidemia)   HTN (hypertension)   CAD S/P percutaneous coronary angioplasty   Diabetes mellitus (HCC)   Pacemaker   Folic acid deficiency   BPH (benign prostatic hyperplasia)   Hepatic cirrhosis due to chronic hepatitis C infection ([place])   Normocytic anemia   GERD (gastroesophageal reflux disease)   #1. Acute cholecystitis: Patient is status post percutaneous tube placement by IR.  Start clear liquid diet and monitor.  #2 hepatocellular carcinoma: Suspected.  Secondary to hepatitis C and cirrhosis.  Continue follow-up with gastroenterology  #3 GERD: Continue PPIs  #4 coronary artery disease: Status post pacemaker placement.  Patient currently stable  #5 hypertension: Blood pressure is controlled.  No change in medication  #6 diabetes: Blood sugar is better controlled.  Continue sliding scale insulin.    DVT prophylaxis:  SCD  Code Status: Full  Family Communication: Son at Bedside  Disposition Plan: Home with Family   Consultants:   General surgery Dr. Margie Billet  IR Dr Annamaria Boots  Procedures: insertion of   perc cholecystostomy  Antimicrobials: Zosyn day #2  Subjective: Patient is recuperating after cholecystostomy tube insertion.  No complaint at the moment.  Pain mainly at the same site which is chronic  Objective: Vitals:   05/02/17 1027 05/02/17 1031 05/02/17 1058 05/02/17 1346  BP: (!) 144/62 132/62 (!) 119/54 138/60  Pulse: 68 68 78 64  Resp: 14 17 14 16   Temp:   97.8 F (36.6 C) (!) 97.5 F (36.4 C)  TempSrc:   Oral Oral  SpO2: 98% 98% 100% 100%  Weight:      Height:        Intake/Output Summary (Last 24 hours) at 05/02/2017 1458 Last data filed at 05/02/2017 1348 Gross per 24 hour  Intake 1658.34 ml  Output 880 ml  Net 778.34 ml   Filed Weights   04/30/17 2112 05/01/17 2026 05/02/17 0449  Weight: 81.6 kg (180 lb) 81.6 kg (180 lb) 81.6 kg (179 lb 15.7 oz)    Examination:  General exam: Appears calm and comfortable  Respiratory system: Clear to auscultation. Respiratory effort normal. Cardiovascular system: S1 & S2 heard, RRR. No JVD, murmurs, rubs, gallops or clicks. No pedal edema. Gastrointestinal system: Abdomen is nondistended, soft and nontender. No organomegaly or masses felt. Normal bowel sounds heard. Central nervous system: Alert and oriented. No focal neurological deficits. Extremities: Symmetric 5 x 5 power. Skin: No rashes, lesions or ulcers Psychiatry: Judgement and insight appear normal. Mood & affect appropriate.     Data Reviewed: I have personally reviewed following labs and imaging studies  CBC: Recent Labs  Lab 04/25/17 1838 04/30/17 2126 05/02/17 0542  WBC 6.3  8.2 5.5  NEUTROABS 4.9  --   --   HGB 13.6 12.6* 11.6*  HCT 41.7 36.7* 35.6*  MCV 93.1 89.5 92.5  PLT 104* 123* 009*   Basic Metabolic Panel: Recent Labs  Lab 04/25/17 1838 04/30/17 2126 05/02/17 0542  NA 139 134* 136  K 4.8 3.8 4.2  CL 102 99* 102  CO2 26 21* 20*  GLUCOSE 251* 190* 96  BUN 19 17 17   CREATININE 0.73 0.75 0.90  CALCIUM 9.8 8.9 8.5*    GFR: Estimated Creatinine Clearance: 71 mL/min (by C-G formula based on SCr of 0.9 mg/dL). Liver Function Tests: Recent Labs  Lab 04/25/17 1838 04/30/17 2126 05/02/17 0542  AST 21 47* 38  ALT 25 40 44  ALKPHOS 107 128* 148*  BILITOT 0.7 1.4* 2.2*  PROT 7.8 7.0 6.0*  ALBUMIN 4.0 3.3* 2.5*   Recent Labs  Lab 04/25/17 1838 04/30/17 2126 05/02/17 0542  LIPASE 25 24 18    No results for input(s): AMMONIA in the last 168 hours. Coagulation Profile: Recent Labs  Lab 04/25/17 1838 05/01/17 1523 05/02/17 0542  INR 0.98 1.09 1.13   Cardiac Enzymes: Recent Labs  Lab 04/25/17 1838 05/01/17 1523  TROPONINI <0.03 <0.03   BNP (last 3 results) No results for input(s): PROBNP in the last 8760 hours. HbA1C: Recent Labs    05/01/17 1523  HGBA1C 6.8*   CBG: Recent Labs  Lab 05/01/17 2243 05/02/17 0812 05/02/17 1236  GLUCAP 107* 96 105*   Lipid Profile: No results for input(s): CHOL, HDL, LDLCALC, TRIG, CHOLHDL, LDLDIRECT in the last 72 hours. Thyroid Function Tests: No results for input(s): TSH, T4TOTAL, FREET4, T3FREE, THYROIDAB in the last 72 hours. Anemia Panel: No results for input(s): VITAMINB12, FOLATE, FERRITIN, TIBC, IRON, RETICCTPCT in the last 72 hours. Urine analysis:    Component Value Date/Time   COLORURINE YELLOW 04/30/2017 2114   APPEARANCEUR CLEAR 04/30/2017 2114   LABSPEC 1.037 (H) 04/30/2017 2114   PHURINE 6.0 04/30/2017 2114   GLUCOSEU >=500 (A) 04/30/2017 2114   HGBUR SMALL (A) 04/30/2017 2114   BILIRUBINUR NEGATIVE 04/30/2017 2114   KETONESUR NEGATIVE 04/30/2017 2114   PROTEINUR NEGATIVE 04/30/2017 2114   NITRITE NEGATIVE 04/30/2017 2114   LEUKOCYTESUR NEGATIVE 04/30/2017 2114   Sepsis Labs: @LABRCNTIP (procalcitonin:4,lacticidven:4)  ) Recent Results (from the past 240 hour(s))  Body fluid culture     Status: None (Preliminary result)   Collection Time: 05/02/17 10:46 AM  Result Value Ref Range Status   Specimen Description BILE   Final   Special Requests NONE  Final   Gram Stain   Final    ABUNDANT WBC PRESENT, PREDOMINANTLY PMN MODERATE GRAM NEGATIVE RODS Performed at [facility], [place] [address] [place], [place] [address]    Culture PENDING  Incomplete   Report Status PENDING  Incomplete         Radiology Studies: Nm Hepatobiliary Liver Func  Result Date: 05/01/2017 CLINICAL DATA:  Right upper quadrant pain EXAM: NUCLEAR MEDICINE HEPATOBILIARY IMAGING TECHNIQUE: Sequential images of the abdomen were obtained out to 60 minutes following intravenous administration of radiopharmaceutical. RADIOPHARMACEUTICALS:  5.02  mCi Tc-45m  Choletec IV 3 mg of intravenous morphine COMPARISON:  Ultrasound 04/29/2017, CT 04/25/2017 FINDINGS: Normal excretion of radiotracer into the intestinal tract. Homogeneous activity in the liver. Nonvisualized gallbladder at 60 minutes. No gallbladder visualization following morphine augmentation. IMPRESSION: Nonvisualized gallbladder despite morphine augmentation, concerning for acute cholecystitis. Normal biliary to bowel excretion. Electronically Signed   By: Madie Reno.D.  On: 05/01/2017 19:00   Ir Perc Cholecystostomy  Result Date: 05/02/2017 INDICATION: ACUTE CALCULUS CHOLECYSTITIS, NON OPERATIVE CANDIDATE EXAM: PERCUTANEOUS CHOLECYSTOSTOMY MEDICATIONS: 3.37 g Zosyn; The antibiotic was administered within an appropriate time frame prior to the initiation of the procedure. ANESTHESIA/SEDATION: Moderate (conscious) sedation was employed during this procedure. A total of Versed 2.0 mg and Fentanyl 100 mcg was administered intravenously. Moderate Sedation Time: 11 minutes. The patient's level of consciousness and vital signs were monitored continuously by radiology nursing throughout the procedure under my direct supervision. FLUOROSCOPY TIME:  Fluoroscopy Time:  12 seconds (3 mGy). COMPLICATIONS: None immediate. PROCEDURE: Informed written consent was obtained from the patient  after a thorough discussion of the procedural risks, benefits and alternatives. All questions were addressed. Maximal Sterile Barrier Technique was utilized including caps, mask, sterile gowns, sterile gloves, sterile drape, hand hygiene and skin antiseptic. A timeout was performed prior to the initiation of the procedure. Previous imaging reviewed. Preliminary ultrasound performed. The distended gallbladder was localized in the right upper quadrant through a lower intercostal space in the mid axillary line. Under sterile conditions and local anesthesia, ultrasound percutaneous access performed of the gallbladder. Needle position confirmed with ultrasound. Images obtained for documentation. Guidewire inserted followed by Accustick dilator set. Amplatz guidewire advanced into the gallbladder. Tract dilatation performed to insert a 10 [place] drain. Drain catheter advanced into the gallbladder with the retention loop secured. Syringe aspiration yielded 115 cc purulence bile. Sample sent for culture. Catheter secured with a Prolene suture and connected to external gravity drainage bag. Sterile dressing applied. No immediate complication. Patient tolerated the procedure well. IMPRESSION: Successful ultrasound and fluoroscopic 10 French percutaneous cholecystostomy. Electronically Signed   By: Jerilynn Mages.  Shick M.D.   On: 05/02/2017 10:35        Scheduled Meds: . clotrimazole  1 application Topical BID  . famotidine  20 mg Oral BID  . folic acid  1 mg Oral Daily  . insulin aspart  0-9 Units Subcutaneous TID WC  . isosorbide mononitrate  60 mg Oral BID  . lidocaine      . lisinopril  2.5 mg Oral Daily  . metoprolol tartrate  50 mg Oral BID  . pantoprazole  40 mg Oral Daily  . sodium chloride flush  5 mL Intracatheter Q8H  . terazosin  5 mg Oral QPM   Continuous Infusions: . sodium chloride 100 mL/hr at 05/02/17 0836  . piperacillin-tazobactam (ZOSYN)  IV 3.375 g (05/02/17 1334)     LOS: 1 day    Time  spent: 48 minutes    Mashayla Lavin,LAWAL, MD [facility] Pager [phone]  If 7PM-7AM, please contact night-coverage [email or web address] Password [facility] 05/02/2017, 2:58 PM

## 2017-05-02 NOTE — Procedures (Signed)
Acute cholecystitis  S/p perc cholecystostomy  No comp Stable EBL 0 115 cc purulent bile aspirated  Full report in pacs

## 2017-05-03 DIAGNOSIS — E0800 Diabetes mellitus due to underlying condition with hyperosmolarity without nonketotic hyperglycemic-hyperosmolar coma (NKHHC): Secondary | ICD-10-CM

## 2017-05-03 DIAGNOSIS — K210 Gastro-esophageal reflux disease with esophagitis: Secondary | ICD-10-CM

## 2017-05-03 LAB — CBC WITH DIFFERENTIAL/PLATELET
Basophils Absolute: 0 10*3/uL (ref 0.0–0.1)
Basophils Relative: 0 %
EOS PCT: 1 %
Eosinophils Absolute: 0.1 10*3/uL (ref 0.0–0.7)
HCT: 34.1 % — ABNORMAL LOW (ref 39.0–52.0)
Hemoglobin: 11 g/dL — ABNORMAL LOW (ref 13.0–17.0)
LYMPHS PCT: 9 %
Lymphs Abs: 0.6 10*3/uL — ABNORMAL LOW (ref 0.7–4.0)
MCH: 29.9 pg (ref 26.0–34.0)
MCHC: 32.3 g/dL (ref 30.0–36.0)
MCV: 92.7 fL (ref 78.0–100.0)
MONO ABS: 0.6 10*3/uL (ref 0.1–1.0)
MONOS PCT: 9 %
Neutro Abs: 5.6 10*3/uL (ref 1.7–7.7)
Neutrophils Relative %: 81 %
PLATELETS: 134 10*3/uL — AB (ref 150–400)
RBC: 3.68 MIL/uL — ABNORMAL LOW (ref 4.22–5.81)
RDW: 13.5 % (ref 11.5–15.5)
WBC: 6.8 10*3/uL (ref 4.0–10.5)

## 2017-05-03 LAB — GLUCOSE, CAPILLARY
Glucose-Capillary: 92 mg/dL (ref 65–99)
Glucose-Capillary: 140 mg/dL — ABNORMAL HIGH (ref 65–99)

## 2017-05-03 MED ORDER — SALINE FLUSH 0.9 % IV SOLN: INTRAVENOUS | 0 refills | Status: DC

## 2017-05-03 MED ORDER — OXYCODONE HCL 5 MG PO TABS: 5.0000 mg | ORAL_TABLET | ORAL | 0 refills | Status: DC | PRN

## 2017-05-03 MED ORDER — AMOXICILLIN-POT CLAVULANATE 875-125 MG PO TABS: 1.0000 | ORAL_TABLET | Freq: Two times a day (BID) | ORAL | 0 refills | Status: AC

## 2017-05-03 NOTE — Progress Notes (Signed)
[name] [name] to be D/C'd  per MD order. Discussed with the patient and all questions fully answered.  VSS, Skin clean, dry and intact without evidence of skin break down, no evidence of skin tears noted.  IV catheter discontinued intact. Site without signs and symptoms of complications. Dressing and pressure applied.  An After Visit Summary was printed and given to the patient. Patient received prescription.  D/c education completed with patient/family including follow up instructions, medication list, d/c activities limitations if indicated, with other d/c instructions as indicated by MD - patient able to verbalize understanding, all questions fully answered.   Patient instructed to return to ED, call 911, or call MD for any changes in condition.   Taught daughter how to do saline flushes and change dressing.   Patient to be escorted via [place], and D/C home via private auto.

## 2017-05-03 NOTE — Discharge Summary (Signed)
Physician Discharge Summary  [name] [name] ZTI:[id number] DOB: [birth year] DOA: 05/01/2017  PCP: [name], MD  Admit date: 05/01/2017 Discharge date: 05/03/2017  Time spent: 32 minutes  Recommendations for Outpatient Follow-up:  1. Follow-up with general surgery 2. Follow-up with gastroenterology    Discharge Diagnoses:  Principal Problem:   Abdominal pain Active Problems:   HLD (hyperlipidemia)   HTN (hypertension)   CAD S/P percutaneous coronary angioplasty   Diabetes mellitus (HCC)   Pacemaker   Folic acid deficiency   BPH (benign prostatic hyperplasia)   Hepatic cirrhosis due to chronic hepatitis C infection ([place])   Normocytic anemia   GERD (gastroesophageal reflux disease)   Discharge Condition: good  Diet recommendation: Low fat  Filed Weights   05/01/17 2026 05/02/17 0449 05/03/17 0415  Weight: 81.6 kg (180 lb) 81.6 kg (179 lb 15.7 oz) 82.5 kg (181 lb 14.1 oz)    History of present illness:  patient is 77 year old Hispanic male with history of diabetes hypertension and liver cirrhosis and hepatitis C who was admitted with acute cholecystitis.  Patient has been seen by Surgery and phone contact with  gastroenterology and is not a candidate for surgical intervention. Patient with 1 month of worsening RUQ/epigastric pain in the setting of hepatocellular carcinoma for which he is planning to undergo microwave ablation of liver and TACE for new/enlarging liver lesions   Recommendation has been for percutaneous cholecystostomy tube insertion.    Hospital Course:  She was admitted on evaluated by surgery.  Diagnosis was for chronic cholecystitis.  Patient is not a candidate for any surgical intervention due to above medical problems.  Percutaneous cholecystostomy tube was inserted and he did very well.  Subsequently patient was discharged home to follow with gastroenterology as well as surgery.  He responded to diet well afterwards.  He was given prescription  for his pain medication pending his visit with his physician.  Patient was evaluated for home needs and appears to have no significant home needs.  Family supportive and at bedside  Procedures:  Insertion of percutaneous cholecystostomy tube  Consultations:  General surgery Dr. Margie Billet  IR Dr Annamaria Boots   Discharge Exam: Vitals:   05/03/17 0415 05/03/17 1401  BP: (!) 121/48 (!) 127/53  Pulse: 77 72  Resp: 18 19  Temp: 98.7 F (37.1 C) 97.7 F (36.5 C)  SpO2: 97% 99%    General: Stable, NAD Cardiovascular: RRR Respiratory: Good AE Bilaterally, No wh  Discharge Instructions   Discharge Instructions    Diet - low sodium heart healthy   Complete by:  As directed    Increase activity slowly   Complete by:  As directed      Allergies as of 05/03/2017   No Known Allergies     Medication List    STOP taking these medications   empagliflozin 25 MG Tabs tablet Commonly known as:  JARDIANCE   glipiZIDE 5 MG tablet Commonly known as:  GLUCOTROL   metFORMIN 500 MG tablet Commonly known as:  GLUCOPHAGE     TAKE these medications   amoxicillin-clavulanate 875-125 MG tablet Commonly known as:  AUGMENTIN Take 1 tablet by mouth 2 (two) times daily for 14 days.   aspirin 81 MG tablet Take 81 mg by mouth daily.   atorvastatin 40 MG tablet Commonly known as:  LIPITOR Take 1 tablet (40 mg total) by mouth daily.   clotrimazole 1 % cream Commonly known as:  LOTRIMIN Apply 1 application topically 2 (two) times daily.  clotrimazole-betamethasone cream Commonly known as:  LOTRISONE Apply 1 application topically 2 (two) times daily.   dicyclomine 20 MG tablet Commonly known as:  BENTYL Take 1 tablet (20 mg total) by mouth every 6 (six) hours as needed for spasms (abdominal cramping).   famotidine 20 MG tablet Commonly known as:  PEPCID Take 1 tablet (20 mg total) by mouth 2 (two) times daily.   folic acid 1 MG tablet Commonly known as:  FOLVITE TAKE (1)  TABLET BY MOUTH ONCE DAILY.   glucose blood test strip 1 each by Other route daily. Dispense per insurance and patient preference.   isosorbide mononitrate 60 MG 24 hr tablet Commonly known as:  IMDUR TAKE 1 TABLET BY MOUTH EVERY MORNING AND 1 TABLET AT SUPPER TIME. What changed:  See the new instructions.   Lancets 30G Misc 1 each by Does not apply route daily. Dispense per insurance and patient preference   lisinopril 2.5 MG tablet Commonly known as:  PRINIVIL,ZESTRIL TAKE ONE TABLET BY MOUTH DAILY.   metoprolol tartrate 50 MG tablet Commonly known as:  LOPRESSOR Take 1 tablet (50 mg total) by mouth 2 (two) times daily.   nitroGLYCERIN 0.4 MG SL tablet Commonly known as:  NITROSTAT Place 1 tablet (0.4 mg total) under the tongue every 5 (five) minutes x 3 doses as needed for chest pain.   omeprazole 20 MG capsule Commonly known as:  PRILOSEC TAKE 1 CAPSULE BY MOUTH TWICE DAILY. What changed:    how much to take  how to take this  when to take this   ondansetron 4 MG tablet Commonly known as:  ZOFRAN Take 1 tablet (4 mg total) by mouth every 8 (eight) hours as needed for nausea or vomiting.   oxyCODONE 5 MG immediate release tablet Commonly known as:  ROXICODONE Take 1 tablet (5 mg total) by mouth every 4 (four) hours as needed for moderate pain or severe pain. What changed:  when to take this   terazosin 5 MG capsule Commonly known as:  HYTRIN Take 1 capsule (5 mg total) by mouth every evening.   traMADol 50 MG tablet Commonly known as:  ULTRAM Take 1 tablet (50 mg total) by mouth every 6 (six) hours as needed.      No Known Allergies Follow-up Information    Fanny Skates, MD Follow up in 5 week(s).   Specialty:  General Surgery Contact information: [address] [place] [address] [phone]        Modena Nunnery, MD .   Specialty:  Family Medicine Contact information: [place] [address] [phone]             The results of significant diagnostics from this hospitalization (including imaging, microbiology, ancillary and laboratory) are listed below for reference.    Significant Diagnostic Studies: Dg Chest 2 View  Result Date: 04/25/2017 CLINICAL DATA:  Right upper quadrant pain. EXAM: CHEST - 2 VIEW COMPARISON:  03/13/2016 FINDINGS: Interstitial markings are diffusely coarsened with chronic features. The lungs are clear without focal pneumonia, edema, pneumothorax or pleural effusion. The cardiopericardial silhouette is within normal limits for size. Left-sided dual lead permanent pacemaker noted. The visualized bony structures of the thorax are intact. Telemetry leads overlie the chest. IMPRESSION: Stable.  No acute cardiopulmonary findings. Electronically Signed   By: Misty Stanley M.D.   On: 04/25/2017 20:02   Nm Hepatobiliary Liver Func  Result Date: 05/01/2017 CLINICAL DATA:  Right upper quadrant pain EXAM: NUCLEAR  MEDICINE HEPATOBILIARY IMAGING TECHNIQUE: Sequential images of the abdomen were obtained out to 60 minutes following intravenous administration of radiopharmaceutical. RADIOPHARMACEUTICALS:  5.02  mCi Tc-60m  Choletec IV 3 mg of intravenous morphine COMPARISON:  Ultrasound 04/29/2017, CT 04/25/2017 FINDINGS: Normal excretion of radiotracer into the intestinal tract. Homogeneous activity in the liver. Nonvisualized gallbladder at 60 minutes. No gallbladder visualization following morphine augmentation. IMPRESSION: Nonvisualized gallbladder despite morphine augmentation, concerning for acute cholecystitis. Normal biliary to bowel excretion. Electronically Signed   By: Donavan Foil M.D.   On: 05/01/2017 19:00   Ct Abdomen Pelvis W Contrast  Result Date: 04/25/2017 CLINICAL DATA:  Right upper quadrant pain. History of hepatocellular carcinoma. Status post ablation on 01/23/2015. EXAM: CT ABDOMEN AND PELVIS WITH CONTRAST TECHNIQUE: Multidetector CT imaging of the abdomen and pelvis was  performed using the standard protocol following bolus administration of intravenous contrast. CONTRAST:  143mL ISOVUE-300 IOPAMIDOL (ISOVUE-300) INJECTION 61% COMPARISON:  05/08/2015. MRI abdomen report from [place] is available and [facility] everywhere. This report has been reviewed. FINDINGS: Lower chest: Heart is enlarged. Permanent pacer wires evident. Coronary artery calcification is evident. Hepatobiliary: Interval retraction of the small ablation defect in posterior right liver (segment VI). A larger ablation defect is identified in segment V. multiple other scattered small liver lesions are evident, better characterized on the previous outside MRI. Gallbladder is distended. No intrahepatic or extrahepatic biliary dilation. Pancreas: No focal mass lesion. No dilatation of the main duct. No intraparenchymal cyst. No peripancreatic edema. Spleen: Calcified granulomata with scattered tiny hypoattenuating lesions too small to characterize. Adrenals/Urinary Tract: No adrenal nodule or mass. Kidneys unremarkable. No evidence for hydroureter. The urinary bladder appears normal for the degree of distention. Stomach/Bowel: Stomach is nondistended. Wall thickening in the gastric antrum is likely related to peristalsis. Duodenum is normally positioned as is the ligament of Treitz. No small bowel wall thickening. No small bowel dilatation. Fecalized enteric contents are noted in the distal small bowel without small bowel dilatation. Small bowel measures up to a maximum of about 2.5 cm diameter. The terminal ileum is normal. The appendix is not visualized, but there is no edema or inflammation in the region of the cecum. Diverticular changes are noted in the left colon without evidence of diverticulitis. Vascular/Lymphatic: There is abdominal aortic atherosclerosis without aneurysm. There is no gastrohepatic or hepatoduodenal ligament lymphadenopathy. No intraperitoneal or retroperitoneal lymphadenopathy.  No pelvic sidewall lymphadenopathy. Reproductive: Prostate gland is markedly enlarged. Other: No intraperitoneal free fluid. Musculoskeletal: Small bilateral groin hernias contain only fat. Complex epigastric ventral hernia with multiple fascial defects contains only fat. Bone windows reveal no worrisome lytic or sclerotic osseous lesions. IMPRESSION: 1. No acute findings in the abdomen or pelvis. 2. Ablation defects in the liver with other scattered tiny liver lesions evident. Report for outside MRI documents lesions suspicious for HCC. 3. Prostatomegaly. 4.  Aortic Atherosclerois (ICD10-170.0) Electronically Signed   By: Misty Stanley M.D.   On: 04/25/2017 20:30   Ir Perc Cholecystostomy  Result Date: 05/02/2017 INDICATION: ACUTE CALCULUS CHOLECYSTITIS, NON OPERATIVE CANDIDATE EXAM: PERCUTANEOUS CHOLECYSTOSTOMY MEDICATIONS: 3.37 g Zosyn; The antibiotic was administered within an appropriate time frame prior to the initiation of the procedure. ANESTHESIA/SEDATION: Moderate (conscious) sedation was employed during this procedure. A total of Versed 2.0 mg and Fentanyl 100 mcg was administered intravenously. Moderate Sedation Time: 11 minutes. The patient's level of consciousness and vital signs were monitored continuously by radiology nursing throughout the procedure under my direct supervision. FLUOROSCOPY TIME:  Fluoroscopy Time:  12 seconds (3 mGy). COMPLICATIONS: None immediate. PROCEDURE: Informed written consent was obtained from the patient after a thorough discussion of the procedural risks, benefits and alternatives. All questions were addressed. Maximal Sterile Barrier Technique was utilized including caps, mask, sterile gowns, sterile gloves, sterile drape, hand hygiene and skin antiseptic. A timeout was performed prior to the initiation of the procedure. Previous imaging reviewed. Preliminary ultrasound performed. The distended gallbladder was localized in the right upper quadrant through a lower  intercostal space in the mid axillary line. Under sterile conditions and local anesthesia, ultrasound percutaneous access performed of the gallbladder. Needle position confirmed with ultrasound. Images obtained for documentation. Guidewire inserted followed by Accustick dilator set. Amplatz guidewire advanced into the gallbladder. Tract dilatation performed to insert a 10 [place] drain. Drain catheter advanced into the gallbladder with the retention loop secured. Syringe aspiration yielded 115 cc purulence bile. Sample sent for culture. Catheter secured with a Prolene suture and connected to external gravity drainage bag. Sterile dressing applied. No immediate complication. Patient tolerated the procedure well. IMPRESSION: Successful ultrasound and fluoroscopic 10 French percutaneous cholecystostomy. Electronically Signed   By: Jerilynn Mages.  Shick M.D.   On: 05/02/2017 10:35   US Abdomen Limited Ruq  Result Date: 04/29/2017 CLINICAL DATA:  Biliary colic EXAM: ULTRASOUND ABDOMEN LIMITED RIGHT UPPER QUADRANT COMPARISON:  Abdominal CT 04/25/2017 FINDINGS: Gallbladder: Distended gallbladder with sludge. No calcified/shadowing stone seen. Gallbladder wall thickening which is overestimated on still images given inclusion of pericholecystic fat, but still thick at 4 mm. There is no sonographic Murphy sign. Common bile duct: Diameter: 6 mm.  Where visualized, no filling defect. Liver: Echogenic mass in the right liver measuring 14 mm. There are lesions recently characterized by liver MRI at [facility] 04/22/2017. Known cirrhosis. Previous liver ablation which is not well demonstrated. Portal vein is patent on color Doppler imaging with normal direction of blood flow towards the liver. IMPRESSION: 1. Distended gallbladder containing sludge. No cholelithiasis. There is mild gallbladder wall thickening which may be reactive to the patient's cirrhosis; no focal tenderness as would be expected for acute cholecystitis. 2. Known  cirrhosis. Known liver lesions as characterized by MRI 1 week prior. A single lesion is visible today in the right liver at 14 mm. Electronically Signed   By: Monte Fantasia M.D.   On: 04/29/2017 11:27    Microbiology: Recent Results (from the past 240 hour(s))  Body fluid culture     Status: None (Preliminary result)   Collection Time: 05/02/17 10:46 AM  Result Value Ref Range Status   Specimen Description BILE  Final   Special Requests NONE  Final   Gram Stain   Final    ABUNDANT WBC PRESENT, PREDOMINANTLY PMN MODERATE GRAM NEGATIVE RODS    Culture   Final    ABUNDANT GRAM NEGATIVE RODS IDENTIFICATION AND SUSCEPTIBILITIES TO FOLLOW Performed at [facility], [address], [place], [place] [address]    Report Status PENDING  Incomplete     Labs: Basic Metabolic Panel: Recent Labs  Lab 04/30/17 2126 05/02/17 0542  NA 134* 136  K 3.8 4.2  CL 99* 102  CO2 21* 20*  GLUCOSE 190* 96  BUN 17 17  CREATININE 0.75 0.90  CALCIUM 8.9 8.5*   Liver Function Tests: Recent Labs  Lab 04/30/17 2126 05/02/17 0542  AST 47* 38  ALT 40 44  ALKPHOS 128* 148*  BILITOT 1.4* 2.2*  PROT 7.0 6.0*  ALBUMIN 3.3* 2.5*   Recent Labs  Lab 04/30/17  2126 05/02/17 0542  LIPASE 24 18   No results for input(s): AMMONIA in the last 168 hours. CBC: Recent Labs  Lab 04/30/17 2126 05/02/17 0542 05/03/17 0906  WBC 8.2 5.5 6.8  NEUTROABS  --   --  5.6  HGB 12.6* 11.6* 11.0*  HCT 36.7* 35.6* 34.1*  MCV 89.5 92.5 92.7  PLT 123* 126* 134*   Cardiac Enzymes: Recent Labs  Lab 05/01/17 1523  TROPONINI <0.03   BNP: BNP (last 3 results) No results for input(s): BNP in the last 8760 hours.  ProBNP (last 3 results) No results for input(s): PROBNP in the last 8760 hours.  CBG: Recent Labs  Lab 05/02/17 1236 05/02/17 1650 05/02/17 2228 05/03/17 0801 05/03/17 1230  GLUCAP 105* 119* 118* 92 140*       Signed:  GARBA,LAWAL MD.  [facility] 05/03/2017, 2:21  PM

## 2017-05-03 NOTE — Progress Notes (Signed)
Subjective/Chief Complaint: Pt's IR drain place Pt feels better this AM    Objective: Vital signs in last 24 hours: Temp:  [97.5 F (36.4 C)-98.7 F (37.1 C)] 98.7 F (37.1 C) (03/31 0415) Pulse Rate:  [64-93] 77 (03/31 0415) Resp:  [13-18] 18 (03/31 0415) BP: (119-144)/(48-67) 121/48 (03/31 0415) SpO2:  [97 %-100 %] 97 % (03/31 0415) Weight:  [82.5 kg (181 lb 14.1 oz)] 82.5 kg (181 lb 14.1 oz) (03/31 0415) Last BM Date: 04/30/17  Intake/Output from previous day: 03/30 0701 - 03/31 0700 In: 3170 [P.O.:340; I.V.:2620; IV Piggyback:200] Out: 9449 [Urine:1450; Drains:205] Intake/Output this shift: No intake/output data recorded.   Constitutional: No acute distress, conversant, appears states age. Eyes: Anicteric sclerae, moist conjunctiva, no lid lag Lungs: Clear to auscultation bilaterally, normal respiratory effort CV: regular rate and rhythm, no murmurs, no peripheral edema, pedal pulses 2+ GI: Soft, no masses or hepatosplenomegaly, non-tender to palpation, JP drain bilious Skin: No rashes, palpation reveals normal turgor Psychiatric: appropriate judgment and insight, oriented to person, place, and time   Lab Results:  Recent Labs    04/30/17 2126 05/02/17 0542  WBC 8.2 5.5  HGB 12.6* 11.6*  HCT 36.7* 35.6*  PLT 123* 126*   BMET Recent Labs    04/30/17 2126 05/02/17 0542  NA 134* 136  K 3.8 4.2  CL 99* 102  CO2 21* 20*  GLUCOSE 190* 96  BUN 17 17  CREATININE 0.75 0.90  CALCIUM 8.9 8.5*   PT/INR Recent Labs    05/01/17 1523 05/02/17 0542  LABPROT 14.0 14.5  INR 1.09 1.13   ABG No results for input(s): PHART, HCO3 in the last 72 hours.  Invalid input(s): PCO2, PO2  Studies/Results: Nm Hepatobiliary Liver Func  Result Date: 05/01/2017 CLINICAL DATA:  Right upper quadrant pain EXAM: NUCLEAR MEDICINE HEPATOBILIARY IMAGING TECHNIQUE: Sequential images of the abdomen were obtained out to 60 minutes following intravenous administration of  radiopharmaceutical. RADIOPHARMACEUTICALS:  5.02  mCi Tc-66m  Choletec IV 3 mg of intravenous morphine COMPARISON:  Ultrasound 04/29/2017, CT 04/25/2017 FINDINGS: Normal excretion of radiotracer into the intestinal tract. Homogeneous activity in the liver. Nonvisualized gallbladder at 60 minutes. No gallbladder visualization following morphine augmentation. IMPRESSION: Nonvisualized gallbladder despite morphine augmentation, concerning for acute cholecystitis. Normal biliary to bowel excretion. Electronically Signed   By: Donavan Foil M.D.   On: 05/01/2017 19:00   Ir Perc Cholecystostomy  Result Date: 05/02/2017 INDICATION: ACUTE CALCULUS CHOLECYSTITIS, NON OPERATIVE CANDIDATE EXAM: PERCUTANEOUS CHOLECYSTOSTOMY MEDICATIONS: 3.37 g Zosyn; The antibiotic was administered within an appropriate time frame prior to the initiation of the procedure. ANESTHESIA/SEDATION: Moderate (conscious) sedation was employed during this procedure. A total of Versed 2.0 mg and Fentanyl 100 mcg was administered intravenously. Moderate Sedation Time: 11 minutes. The patient's level of consciousness and vital signs were monitored continuously by radiology nursing throughout the procedure under my direct supervision. FLUOROSCOPY TIME:  Fluoroscopy Time:  12 seconds (3 mGy). COMPLICATIONS: None immediate. PROCEDURE: Informed written consent was obtained from the patient after a thorough discussion of the procedural risks, benefits and alternatives. All questions were addressed. Maximal Sterile Barrier Technique was utilized including caps, mask, sterile gowns, sterile gloves, sterile drape, hand hygiene and skin antiseptic. A timeout was performed prior to the initiation of the procedure. Previous imaging reviewed. Preliminary ultrasound performed. The distended gallbladder was localized in the right upper quadrant through a lower intercostal space in the mid axillary line. Under sterile conditions and local anesthesia, ultrasound  percutaneous access performed of  the gallbladder. Needle position confirmed with ultrasound. Images obtained for documentation. Guidewire inserted followed by Accustick dilator set. Amplatz guidewire advanced into the gallbladder. Tract dilatation performed to insert a 10 [place] drain. Drain catheter advanced into the gallbladder with the retention loop secured. Syringe aspiration yielded 115 cc purulence bile. Sample sent for culture. Catheter secured with a Prolene suture and connected to external gravity drainage bag. Sterile dressing applied. No immediate complication. Patient tolerated the procedure well. IMPRESSION: Successful ultrasound and fluoroscopic 10 French percutaneous cholecystostomy. Electronically Signed   By: Jerilynn Mages.  Shick M.D.   On: 05/02/2017 10:35    Anti-infectives: Anti-infectives (From admission, onward)   Start     Dose/Rate Route Frequency Ordered Stop   05/01/17 2200  piperacillin-tazobactam (ZOSYN) IVPB 3.375 g     3.375 g 12.5 mL/hr over 240 Minutes Intravenous Every 8 hours 05/01/17 1502     05/01/17 1500  piperacillin-tazobactam (ZOSYN) IVPB 3.375 g     3.375 g 100 mL/hr over 30 Minutes Intravenous  Once 05/01/17 1449 05/01/17 1608      Assessment/Plan: HLD Liver cirrhosis - followed by Dr. Oneida Alar GI in [place] CAD s/p cardiac stent - cardiologist Dr. [name] in [place] H/o pacemaker placement Hepatocellular carcinoma - followed by Dr. Pecola Lawless at [facility], planning microwave ablation of liver and TACE for new/enlarging liver lesions Acute cholecystitis   Plan: OK to adv diet as tol OK for DC from Surgical standpoint  F/u: Dr. Dalbert Batman  LOS: 2 days    Rosario Jacks., [facility] 05/03/2017

## 2017-05-04 ENCOUNTER — Telehealth: Payer: Self-pay

## 2017-05-04 NOTE — Telephone Encounter (Signed)
[name] [name] is aware.

## 2017-05-04 NOTE — Telephone Encounter (Signed)
Pt's daughter, Joycelyn Schmid, called to ask how long the tube needs to be in that was placed for draining his gallbladder.  She said she was told that Dr. Oneida Alar would remove it. He has not appt with Dr. Oneida Alar. He does have a follow up with Walden Field, NP on 06/08/2017. Please let her know how she needs to handle this.

## 2017-05-04 NOTE — Telephone Encounter (Addendum)
PLEASE CALL PT. The tube needs to be managed by whoever placed the order for it to be inserted. Generally when the tube is put in then it doesn't come out. He should talk to Dr. Dalbert Batman or Rosendo Gros at [place] about the management of the drain. He should ask them WHEN they plan to take out his gallbladder.

## 2017-05-05 ENCOUNTER — Ambulatory Visit (HOSPITAL_COMMUNITY)
Admission: RE | Admit: 2017-05-05 | Discharge: 2017-05-05 | Disposition: A | Discharge status: Home / Self Care | Payer: Medicare Other | Source: Ambulatory Visit | Attending: Family Medicine | Admitting: Family Medicine

## 2017-05-05 ENCOUNTER — Encounter: Payer: Self-pay | Admitting: Family Medicine

## 2017-05-05 ENCOUNTER — Ambulatory Visit (INDEPENDENT_AMBULATORY_CARE_PROVIDER_SITE_OTHER): Payer: Medicare Other | Admitting: Family Medicine

## 2017-05-05 VITALS — BP 136/58 | HR 98 | Temp 98.5°F | Resp 18 | Ht 70.0 in

## 2017-05-05 DIAGNOSIS — J189 Pneumonia, unspecified organism: Secondary | ICD-10-CM

## 2017-05-05 DIAGNOSIS — R05 Cough: Secondary | ICD-10-CM | POA: Diagnosis not present

## 2017-05-05 DIAGNOSIS — Z950 Presence of cardiac pacemaker: Secondary | ICD-10-CM | POA: Diagnosis not present

## 2017-05-05 DIAGNOSIS — K819 Cholecystitis, unspecified: Secondary | ICD-10-CM

## 2017-05-05 DIAGNOSIS — J181 Lobar pneumonia, unspecified organism: Secondary | ICD-10-CM | POA: Diagnosis not present

## 2017-05-05 DIAGNOSIS — Z8701 Personal history of pneumonia (recurrent): Secondary | ICD-10-CM | POA: Insufficient documentation

## 2017-05-05 DIAGNOSIS — Z09 Encounter for follow-up examination after completed treatment for conditions other than malignant neoplasm: Secondary | ICD-10-CM | POA: Diagnosis not present

## 2017-05-05 DIAGNOSIS — K8050 Calculus of bile duct without cholangitis or cholecystitis without obstruction: Secondary | ICD-10-CM | POA: Diagnosis not present

## 2017-05-05 DIAGNOSIS — Z434 Encounter for attention to other artificial openings of digestive tract: Secondary | ICD-10-CM | POA: Diagnosis not present

## 2017-05-05 LAB — BODY FLUID CULTURE

## 2017-05-05 MED ORDER — LEVOFLOXACIN 500 MG PO TABS: 500.0000 mg | ORAL_TABLET | Freq: Every day | ORAL | 0 refills | Status: DC
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
## 2017-05-06 DIAGNOSIS — K819 Cholecystitis, unspecified: Secondary | ICD-10-CM | POA: Diagnosis not present

## 2017-05-06 DIAGNOSIS — Z934 Other artificial openings of gastrointestinal tract status: Secondary | ICD-10-CM | POA: Diagnosis not present

## 2017-05-06 DIAGNOSIS — C220 Liver cell carcinoma: Secondary | ICD-10-CM | POA: Diagnosis not present

## 2017-05-08 ENCOUNTER — Ambulatory Visit (INDEPENDENT_AMBULATORY_CARE_PROVIDER_SITE_OTHER): Payer: Medicare Other | Admitting: Family Medicine

## 2017-05-08 ENCOUNTER — Encounter: Payer: Self-pay | Admitting: Family Medicine

## 2017-05-08 VITALS — BP 120/64 | HR 70 | Temp 97.9°F | Resp 14 | Ht 70.0 in | Wt 175.0 lb

## 2017-05-08 DIAGNOSIS — J181 Lobar pneumonia, unspecified organism: Secondary | ICD-10-CM | POA: Diagnosis not present

## 2017-05-08 DIAGNOSIS — J189 Pneumonia, unspecified organism: Secondary | ICD-10-CM
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
## 2017-05-13 ENCOUNTER — Other Ambulatory Visit: Payer: Self-pay | Admitting: Family Medicine

## 2017-05-14 DIAGNOSIS — C220 Liver cell carcinoma: Secondary | ICD-10-CM | POA: Diagnosis not present

## 2017-05-18 ENCOUNTER — Ambulatory Visit (INDEPENDENT_AMBULATORY_CARE_PROVIDER_SITE_OTHER): Payer: Medicare Other | Admitting: *Deleted

## 2017-05-18 ENCOUNTER — Telehealth: Payer: Self-pay | Admitting: Cardiology

## 2017-05-18 DIAGNOSIS — R001 Bradycardia, unspecified: Secondary | ICD-10-CM | POA: Diagnosis not present

## 2017-05-18 NOTE — Telephone Encounter (Signed)
LMOVM reminding pt to send remote transmission.   

## 2017-05-19 ENCOUNTER — Other Ambulatory Visit: Payer: Self-pay | Admitting: Family Medicine

## 2017-05-19 NOTE — Progress Notes (Signed)
Remote pacemaker transmission.   

## 2017-05-20 ENCOUNTER — Encounter: Payer: Self-pay | Admitting: Cardiology

## 2017-05-20 LAB — CUP PACEART REMOTE DEVICE CHECK
Battery Remaining Longevity: 40 mo
Battery Voltage: 2.75 V
Brady Statistic AS VP Percent: 0 %
Implantable Lead Implant Date: 20100520
Implantable Lead Location: 753860
Implantable Pulse Generator Implant Date: 20100520
Lead Channel Impedance Value: 440 Ohm
Lead Channel Pacing Threshold Amplitude: 0.5 V
Lead Channel Pacing Threshold Amplitude: 2.25 V
Lead Channel Pacing Threshold Pulse Width: 0.4 ms
Lead Channel Setting Pacing Amplitude: 3.25 V
Lead Channel Setting Pacing Pulse Width: 1 ms
Lead Channel Setting Sensing Sensitivity: 2 mV
MDC IDC LEAD IMPLANT DT: 20100520
MDC IDC LEAD LOCATION: 753859
MDC IDC MSMT BATTERY IMPEDANCE: 1857 Ohm
MDC IDC MSMT LEADCHNL RA SENSING INTR AMPL: 2.8 mV
MDC IDC MSMT LEADCHNL RV IMPEDANCE VALUE: 580 Ohm
MDC IDC MSMT LEADCHNL RV PACING THRESHOLD PULSEWIDTH: 0.4 ms
MDC IDC MSMT LEADCHNL RV SENSING INTR AMPL: 5.6 mV
MDC IDC SESS DTM: 20190415220126
MDC IDC SET LEADCHNL RA PACING AMPLITUDE: 2 V
MDC IDC STAT BRADY AP VP PERCENT: 0 %
MDC IDC STAT BRADY AP VS PERCENT: 44 %
MDC IDC STAT BRADY AS VS PERCENT: 56 %

## 2017-06-08 ENCOUNTER — Encounter

## 2017-06-08 ENCOUNTER — Telehealth: Payer: Self-pay | Admitting: Nurse Practitioner

## 2017-06-08 ENCOUNTER — Ambulatory Visit: Payer: Medicare Other | Admitting: Nurse Practitioner

## 2017-06-08 ENCOUNTER — Encounter: Payer: Self-pay | Admitting: Gastroenterology
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
## 2017-06-08 NOTE — Telephone Encounter (Signed)
PATIENT WAS A NO SHOW AND LETTER SENT  °

## 2017-06-16 DIAGNOSIS — C220 Liver cell carcinoma: Secondary | ICD-10-CM | POA: Diagnosis not present

## 2017-06-16 DIAGNOSIS — K819 Cholecystitis, unspecified: Secondary | ICD-10-CM | POA: Insufficient documentation

## 2017-06-16 DIAGNOSIS — Z934 Other artificial openings of gastrointestinal tract status: Secondary | ICD-10-CM | POA: Diagnosis not present

## 2017-06-16 DIAGNOSIS — B1920 Unspecified viral hepatitis C without hepatic coma: Secondary | ICD-10-CM | POA: Diagnosis not present

## 2017-06-17 ENCOUNTER — Other Ambulatory Visit: Payer: Self-pay

## 2017-06-17 DIAGNOSIS — K7460 Unspecified cirrhosis of liver: Secondary | ICD-10-CM

## 2017-06-18 ENCOUNTER — Other Ambulatory Visit: Payer: Self-pay | Admitting: Cardiology

## 2017-07-06 DIAGNOSIS — K7460 Unspecified cirrhosis of liver: Secondary | ICD-10-CM | POA: Diagnosis not present

## 2017-07-07 LAB — CBC WITH DIFFERENTIAL/PLATELET
BASOS ABS: 39 {cells}/uL (ref 0–200)
Basophils Relative: 0.6 %
EOS ABS: 221 {cells}/uL (ref 15–500)
Eosinophils Relative: 3.4 %
HCT: 36.9 % — ABNORMAL LOW (ref 38.5–50.0)
Hemoglobin: 12.3 g/dL — ABNORMAL LOW (ref 13.2–17.1)
Lymphs Abs: 1047 cells/uL (ref 850–3900)
MCH: 30.5 pg (ref 27.0–33.0)
MCHC: 33.3 g/dL (ref 32.0–36.0)
MCV: 91.6 fL (ref 80.0–100.0)
MONOS PCT: 7.2 %
MPV: 13.2 fL — ABNORMAL HIGH (ref 7.5–12.5)
Neutro Abs: 4726 cells/uL (ref 1500–7800)
Neutrophils Relative %: 72.7 %
PLATELETS: 102 10*3/uL — AB (ref 140–400)
RBC: 4.03 10*6/uL — ABNORMAL LOW (ref 4.20–5.80)
RDW: 12.6 % (ref 11.0–15.0)
TOTAL LYMPHOCYTE: 16.1 %
WBC mixed population: 468 cells/uL (ref 200–950)
WBC: 6.5 10*3/uL (ref 3.8–10.8)

## 2017-07-07 LAB — FERRITIN: Ferritin: 44 ng/mL (ref 20–380)

## 2017-07-09 ENCOUNTER — Other Ambulatory Visit: Payer: Self-pay | Admitting: Adult Health

## 2017-07-10 DIAGNOSIS — B1920 Unspecified viral hepatitis C without hepatic coma: Secondary | ICD-10-CM | POA: Diagnosis not present

## 2017-07-10 DIAGNOSIS — K7469 Other cirrhosis of liver: Secondary | ICD-10-CM | POA: Diagnosis not present

## 2017-07-10 DIAGNOSIS — K819 Cholecystitis, unspecified: Secondary | ICD-10-CM | POA: Diagnosis not present

## 2017-07-10 DIAGNOSIS — C220 Liver cell carcinoma: Secondary | ICD-10-CM | POA: Diagnosis not present

## 2017-07-15 ENCOUNTER — Telehealth: Payer: Self-pay | Admitting: Gastroenterology

## 2017-07-15 NOTE — Telephone Encounter (Signed)
PLEASE CALL PT. HIS IRON STORES ARE NORMAL. HIS BLOOD COUNT IS STABLE IMPROVED SINCE MAR 2019. IT INCREASED FROM 11.0 TO 12.3.

## 2017-07-16 NOTE — Telephone Encounter (Signed)
LMOM to call.

## 2017-07-16 NOTE — Telephone Encounter (Signed)
Pt's daughter, Joycelyn Schmid, is aware of results.

## 2017-07-27 ENCOUNTER — Other Ambulatory Visit: Payer: Self-pay | Admitting: Family Medicine

## 2017-07-28 DIAGNOSIS — I480 Paroxysmal atrial fibrillation: Secondary | ICD-10-CM | POA: Diagnosis not present

## 2017-07-28 DIAGNOSIS — I252 Old myocardial infarction: Secondary | ICD-10-CM | POA: Diagnosis not present

## 2017-07-28 DIAGNOSIS — Z79899 Other long term (current) drug therapy: Secondary | ICD-10-CM | POA: Diagnosis not present

## 2017-07-28 DIAGNOSIS — I2510 Atherosclerotic heart disease of native coronary artery without angina pectoris: Secondary | ICD-10-CM | POA: Diagnosis not present

## 2017-07-28 DIAGNOSIS — I10 Essential (primary) hypertension: Secondary | ICD-10-CM | POA: Diagnosis not present

## 2017-07-28 DIAGNOSIS — Z87891 Personal history of nicotine dependence: Secondary | ICD-10-CM | POA: Diagnosis not present

## 2017-07-28 DIAGNOSIS — N401 Enlarged prostate with lower urinary tract symptoms: Secondary | ICD-10-CM | POA: Diagnosis not present

## 2017-07-28 DIAGNOSIS — D649 Anemia, unspecified: Secondary | ICD-10-CM | POA: Diagnosis not present

## 2017-07-28 DIAGNOSIS — J449 Chronic obstructive pulmonary disease, unspecified: Secondary | ICD-10-CM | POA: Diagnosis not present

## 2017-07-28 DIAGNOSIS — K811 Chronic cholecystitis: Secondary | ICD-10-CM | POA: Diagnosis not present

## 2017-07-28 DIAGNOSIS — E785 Hyperlipidemia, unspecified: Secondary | ICD-10-CM | POA: Diagnosis not present

## 2017-07-28 DIAGNOSIS — Z950 Presence of cardiac pacemaker: Secondary | ICD-10-CM | POA: Diagnosis not present

## 2017-07-28 DIAGNOSIS — C220 Liver cell carcinoma: Secondary | ICD-10-CM | POA: Diagnosis not present

## 2017-07-28 DIAGNOSIS — Z5331 Laparoscopic surgical procedure converted to open procedure: Secondary | ICD-10-CM | POA: Diagnosis not present

## 2017-07-28 DIAGNOSIS — K219 Gastro-esophageal reflux disease without esophagitis: Secondary | ICD-10-CM | POA: Diagnosis not present

## 2017-07-28 DIAGNOSIS — Z955 Presence of coronary angioplasty implant and graft: Secondary | ICD-10-CM | POA: Diagnosis not present

## 2017-07-28 DIAGNOSIS — K660 Peritoneal adhesions (postprocedural) (postinfection): Secondary | ICD-10-CM | POA: Diagnosis not present

## 2017-07-28 DIAGNOSIS — E119 Type 2 diabetes mellitus without complications: Secondary | ICD-10-CM | POA: Diagnosis not present

## 2017-07-28 DIAGNOSIS — R338 Other retention of urine: Secondary | ICD-10-CM | POA: Diagnosis not present

## 2017-07-28 DIAGNOSIS — Z7982 Long term (current) use of aspirin: Secondary | ICD-10-CM | POA: Diagnosis not present

## 2017-07-28 DIAGNOSIS — Z7984 Long term (current) use of oral hypoglycemic drugs: Secondary | ICD-10-CM | POA: Diagnosis not present

## 2017-07-28 DIAGNOSIS — K7460 Unspecified cirrhosis of liver: Secondary | ICD-10-CM | POA: Diagnosis not present

## 2017-07-28 DIAGNOSIS — D696 Thrombocytopenia, unspecified: Secondary | ICD-10-CM | POA: Diagnosis not present

## 2017-07-28 DIAGNOSIS — Z961 Presence of intraocular lens: Secondary | ICD-10-CM | POA: Diagnosis not present

## 2017-08-03 ENCOUNTER — Other Ambulatory Visit: Payer: Self-pay

## 2017-08-03 ENCOUNTER — Encounter (HOSPITAL_COMMUNITY): Payer: Self-pay | Admitting: Emergency Medicine

## 2017-08-03 ENCOUNTER — Emergency Department (HOSPITAL_COMMUNITY)
Admission: EM | Admit: 2017-08-03 | Discharge: 2017-08-04 | Disposition: A | Discharge status: Home / Self Care | Payer: Medicare Other | Attending: Emergency Medicine | Admitting: Emergency Medicine

## 2017-08-03 DIAGNOSIS — I10 Essential (primary) hypertension: Secondary | ICD-10-CM | POA: Diagnosis not present

## 2017-08-03 DIAGNOSIS — Z438 Encounter for attention to other artificial openings: Secondary | ICD-10-CM | POA: Diagnosis not present

## 2017-08-03 DIAGNOSIS — Z7984 Long term (current) use of oral hypoglycemic drugs: Secondary | ICD-10-CM | POA: Diagnosis not present

## 2017-08-03 DIAGNOSIS — T85518A Breakdown (mechanical) of other gastrointestinal prosthetic devices, implants and grafts, initial encounter: Secondary | ICD-10-CM

## 2017-08-03 DIAGNOSIS — E1140 Type 2 diabetes mellitus with diabetic neuropathy, unspecified: Secondary | ICD-10-CM | POA: Insufficient documentation

## 2017-08-03 DIAGNOSIS — Z7982 Long term (current) use of aspirin: Secondary | ICD-10-CM | POA: Diagnosis not present

## 2017-08-03 DIAGNOSIS — Z79899 Other long term (current) drug therapy: Secondary | ICD-10-CM | POA: Insufficient documentation

## 2017-08-03 DIAGNOSIS — Z87891 Personal history of nicotine dependence: Secondary | ICD-10-CM | POA: Insufficient documentation

## 2017-08-03 DIAGNOSIS — N99518 Other cystostomy complication: Secondary | ICD-10-CM | POA: Diagnosis not present

## 2017-08-03 NOTE — ED Triage Notes (Signed)
Pt with a leaking gallbladder drainage bag.
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
## 2017-08-05 ENCOUNTER — Encounter: Payer: Self-pay | Admitting: Gastroenterology

## 2017-08-05 DIAGNOSIS — R338 Other retention of urine: Secondary | ICD-10-CM | POA: Diagnosis not present

## 2017-08-05 DIAGNOSIS — K219 Gastro-esophageal reflux disease without esophagitis: Secondary | ICD-10-CM | POA: Diagnosis present

## 2017-08-05 DIAGNOSIS — Z955 Presence of coronary angioplasty implant and graft: Secondary | ICD-10-CM | POA: Diagnosis not present

## 2017-08-05 DIAGNOSIS — D649 Anemia, unspecified: Secondary | ICD-10-CM | POA: Diagnosis present

## 2017-08-05 DIAGNOSIS — Z79899 Other long term (current) drug therapy: Secondary | ICD-10-CM | POA: Diagnosis not present

## 2017-08-05 DIAGNOSIS — K810 Acute cholecystitis: Secondary | ICD-10-CM | POA: Diagnosis not present

## 2017-08-05 DIAGNOSIS — G8918 Other acute postprocedural pain: Secondary | ICD-10-CM | POA: Diagnosis not present

## 2017-08-05 DIAGNOSIS — I480 Paroxysmal atrial fibrillation: Secondary | ICD-10-CM | POA: Diagnosis present

## 2017-08-05 DIAGNOSIS — J449 Chronic obstructive pulmonary disease, unspecified: Secondary | ICD-10-CM | POA: Diagnosis present

## 2017-08-05 DIAGNOSIS — Z7982 Long term (current) use of aspirin: Secondary | ICD-10-CM | POA: Diagnosis not present

## 2017-08-05 DIAGNOSIS — N401 Enlarged prostate with lower urinary tract symptoms: Secondary | ICD-10-CM | POA: Diagnosis present

## 2017-08-05 DIAGNOSIS — C220 Liver cell carcinoma: Secondary | ICD-10-CM | POA: Diagnosis not present

## 2017-08-05 DIAGNOSIS — E119 Type 2 diabetes mellitus without complications: Secondary | ICD-10-CM | POA: Diagnosis present

## 2017-08-05 DIAGNOSIS — Z4689 Encounter for fitting and adjustment of other specified devices: Secondary | ICD-10-CM | POA: Diagnosis not present

## 2017-08-05 DIAGNOSIS — K811 Chronic cholecystitis: Secondary | ICD-10-CM | POA: Diagnosis present

## 2017-08-05 DIAGNOSIS — I2510 Atherosclerotic heart disease of native coronary artery without angina pectoris: Secondary | ICD-10-CM | POA: Diagnosis present

## 2017-08-05 DIAGNOSIS — I10 Essential (primary) hypertension: Secondary | ICD-10-CM | POA: Diagnosis present

## 2017-08-05 DIAGNOSIS — Z950 Presence of cardiac pacemaker: Secondary | ICD-10-CM | POA: Diagnosis not present

## 2017-08-05 DIAGNOSIS — D696 Thrombocytopenia, unspecified: Secondary | ICD-10-CM | POA: Diagnosis present

## 2017-08-05 DIAGNOSIS — R109 Unspecified abdominal pain: Secondary | ICD-10-CM | POA: Diagnosis not present

## 2017-08-05 DIAGNOSIS — K8010 Calculus of gallbladder with chronic cholecystitis without obstruction: Secondary | ICD-10-CM | POA: Diagnosis not present

## 2017-08-05 DIAGNOSIS — K828 Other specified diseases of gallbladder: Secondary | ICD-10-CM | POA: Diagnosis not present

## 2017-08-05 DIAGNOSIS — E785 Hyperlipidemia, unspecified: Secondary | ICD-10-CM | POA: Diagnosis present

## 2017-08-05 DIAGNOSIS — Z5331 Laparoscopic surgical procedure converted to open procedure: Secondary | ICD-10-CM | POA: Diagnosis not present

## 2017-08-05 DIAGNOSIS — K660 Peritoneal adhesions (postprocedural) (postinfection): Secondary | ICD-10-CM | POA: Diagnosis present

## 2017-08-05 DIAGNOSIS — Z87891 Personal history of nicotine dependence: Secondary | ICD-10-CM | POA: Diagnosis not present

## 2017-08-05 DIAGNOSIS — I252 Old myocardial infarction: Secondary | ICD-10-CM | POA: Diagnosis not present

## 2017-08-05 DIAGNOSIS — Z7984 Long term (current) use of oral hypoglycemic drugs: Secondary | ICD-10-CM | POA: Diagnosis not present

## 2017-08-05 DIAGNOSIS — K7460 Unspecified cirrhosis of liver: Secondary | ICD-10-CM | POA: Diagnosis not present

## 2017-08-05 DIAGNOSIS — Z961 Presence of intraocular lens: Secondary | ICD-10-CM | POA: Diagnosis present

## 2017-08-12 DIAGNOSIS — R339 Retention of urine, unspecified: Secondary | ICD-10-CM | POA: Diagnosis not present

## 2017-08-12 DIAGNOSIS — Z5320 Procedure and treatment not carried out because of patient's decision for unspecified reasons: Secondary | ICD-10-CM | POA: Diagnosis not present

## 2017-08-17 ENCOUNTER — Encounter: Payer: Medicare Other | Admitting: *Deleted

## 2017-08-17 ENCOUNTER — Telehealth: Payer: Self-pay

## 2017-08-17 NOTE — Telephone Encounter (Signed)
Attempted to confirm remote transmission with pt. No answer and was unable to leave a message.   

## 2017-08-18 ENCOUNTER — Other Ambulatory Visit: Payer: Self-pay | Admitting: Family Medicine

## 2017-08-18 ENCOUNTER — Telehealth: Payer: Self-pay | Admitting: *Deleted

## 2017-08-18 NOTE — Telephone Encounter (Signed)
Patient called bc he received a letter for follow up. He reports he recently had 2 surgeries and is still in recovery. He did not want to make an appt at this time. He wants to wait until he is feeling better.  FYI to [facility]

## 2017-08-19 ENCOUNTER — Encounter: Payer: Self-pay | Admitting: Cardiology

## 2017-08-20 NOTE — Telephone Encounter (Signed)
REVIEWED. [facility] FOR OCT 2019.

## 2017-08-24 ENCOUNTER — Telehealth: Payer: Self-pay | Admitting: Internal Medicine

## 2017-08-24 NOTE — Telephone Encounter (Signed)
Pt is needing help w/ his in home transmissions can be reached @ [phone]

## 2017-08-24 NOTE — Telephone Encounter (Signed)
Pt needs a cell adaptor for his home monitor. I put in a request for [employer] to call the patient to get this taken care of.

## 2017-08-31 ENCOUNTER — Other Ambulatory Visit: Payer: Self-pay | Admitting: Family Medicine

## 2017-09-01 DIAGNOSIS — C220 Liver cell carcinoma: Secondary | ICD-10-CM | POA: Diagnosis not present

## 2017-09-01 DIAGNOSIS — Z9049 Acquired absence of other specified parts of digestive tract: Secondary | ICD-10-CM | POA: Diagnosis not present

## 2017-09-05 ENCOUNTER — Other Ambulatory Visit: Payer: Self-pay | Admitting: Adult Health

## 2017-09-07 ENCOUNTER — Ambulatory Visit (INDEPENDENT_AMBULATORY_CARE_PROVIDER_SITE_OTHER): Payer: Medicare Other | Admitting: *Deleted

## 2017-09-07 DIAGNOSIS — R001 Bradycardia, unspecified: Secondary | ICD-10-CM

## 2017-09-08 NOTE — Progress Notes (Signed)
Remote pacemaker transmission.   

## 2017-09-16 DIAGNOSIS — C220 Liver cell carcinoma: Secondary | ICD-10-CM | POA: Diagnosis not present

## 2017-09-16 DIAGNOSIS — L919 Hypertrophic disorder of the skin, unspecified: Secondary | ICD-10-CM | POA: Diagnosis not present

## 2017-09-16 DIAGNOSIS — L929 Granulomatous disorder of the skin and subcutaneous tissue, unspecified: Secondary | ICD-10-CM | POA: Diagnosis not present

## 2017-09-17 DIAGNOSIS — L919 Hypertrophic disorder of the skin, unspecified: Secondary | ICD-10-CM | POA: Insufficient documentation

## 2017-09-18 ENCOUNTER — Telehealth: Payer: Self-pay | Admitting: Family Medicine

## 2017-09-18 NOTE — Telephone Encounter (Signed)
CB [phone]  Joycelyn Schmid daughter of patient would like you to call her. She feels that her dad is getting dementia and she feels like he might be a mean one. She wants to make an appointment with Dr. Dennard Schaumann so he can evaluate her dad but she would like to talk with nurse before making the appointment to discuss.

## 2017-09-21 ENCOUNTER — Other Ambulatory Visit: Payer: Self-pay | Admitting: Family Medicine

## 2017-09-22 LAB — CUP PACEART REMOTE DEVICE CHECK
Battery Impedance: 2183 Ohm
Battery Voltage: 2.73 V
Brady Statistic AP VP Percent: 0 %
Brady Statistic AS VP Percent: 0 %
Brady Statistic AS VS Percent: 49 %
Implantable Lead Implant Date: 20100520
Implantable Lead Location: 753860
Implantable Lead Model: 4092
Implantable Lead Model: 5076
Implantable Pulse Generator Implant Date: 20100520
Lead Channel Impedance Value: 440 Ohm
Lead Channel Impedance Value: 743 Ohm
Lead Channel Pacing Threshold Amplitude: 0.5 V
Lead Channel Setting Pacing Amplitude: 3.25 V
Lead Channel Setting Sensing Sensitivity: 2 mV
MDC IDC LEAD IMPLANT DT: 20100520
MDC IDC LEAD LOCATION: 753859
MDC IDC MSMT BATTERY REMAINING LONGEVITY: 32 mo
MDC IDC MSMT LEADCHNL RA PACING THRESHOLD PULSEWIDTH: 0.4 ms
MDC IDC MSMT LEADCHNL RV PACING THRESHOLD AMPLITUDE: 2.25 V
MDC IDC MSMT LEADCHNL RV PACING THRESHOLD PULSEWIDTH: 0.4 ms
MDC IDC SESS DTM: 20190804141057
MDC IDC SET LEADCHNL RA PACING AMPLITUDE: 2 V
MDC IDC SET LEADCHNL RV PACING PULSEWIDTH: 1 ms
MDC IDC STAT BRADY AP VS PERCENT: 51 %

## 2017-09-22 NOTE — Telephone Encounter (Signed)
Called and spoke to daughter and we have made him an apt however she has to work so the granddaughter will be bringing him. She wanted to fill you in on what is going on with him but does not want him to know that she talked to [place] about his symptoms (?dementia). He has been very forgetful, locks his keys in his car a lot, he will start cooking and forget, burn stuff that the alarms go off, talks non-stop and repeats himself all the time.

## 2017-09-24 ENCOUNTER — Ambulatory Visit (INDEPENDENT_AMBULATORY_CARE_PROVIDER_SITE_OTHER): Payer: Medicare Other | Admitting: Family Medicine

## 2017-09-24 ENCOUNTER — Encounter: Payer: Self-pay | Admitting: Family Medicine

## 2017-09-24 VITALS — BP 110/58 | HR 62 | Temp 97.8°F | Resp 16 | Ht 70.0 in | Wt 170.0 lb

## 2017-09-24 DIAGNOSIS — E785 Hyperlipidemia, unspecified: Secondary | ICD-10-CM | POA: Diagnosis not present

## 2017-09-24 DIAGNOSIS — I10 Essential (primary) hypertension: Secondary | ICD-10-CM | POA: Diagnosis not present

## 2017-09-24 DIAGNOSIS — R413 Other amnesia: Secondary | ICD-10-CM | POA: Diagnosis not present

## 2017-09-24 DIAGNOSIS — E119 Type 2 diabetes mellitus without complications: Secondary | ICD-10-CM | POA: Diagnosis not present

## 2017-09-24 DIAGNOSIS — E538 Deficiency of other specified B group vitamins: Secondary | ICD-10-CM | POA: Diagnosis not present

## 2017-09-24 DIAGNOSIS — E118 Type 2 diabetes mellitus with unspecified complications: Secondary | ICD-10-CM | POA: Diagnosis not present

## 2017-09-24 DIAGNOSIS — I2510 Atherosclerotic heart disease of native coronary artery without angina pectoris: Secondary | ICD-10-CM

## 2017-09-24 MED ORDER — METOPROLOL TARTRATE 50 MG PO TABS: 50.0000 mg | ORAL_TABLET | Freq: Two times a day (BID) | ORAL | 1 refills | Status: DC
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
## 2017-09-25 ENCOUNTER — Other Ambulatory Visit: Payer: Self-pay | Admitting: Family Medicine

## 2017-09-25 DIAGNOSIS — R413 Other amnesia: Secondary | ICD-10-CM

## 2017-09-25 DIAGNOSIS — E538 Deficiency of other specified B group vitamins: Secondary | ICD-10-CM

## 2017-09-28 ENCOUNTER — Ambulatory Visit (INDEPENDENT_AMBULATORY_CARE_PROVIDER_SITE_OTHER): Payer: Medicare Other

## 2017-09-28 ENCOUNTER — Other Ambulatory Visit: Payer: Medicare Other

## 2017-09-28 VITALS — BP 120/64

## 2017-09-28 DIAGNOSIS — R413 Other amnesia: Secondary | ICD-10-CM

## 2017-09-28 DIAGNOSIS — E538 Deficiency of other specified B group vitamins: Secondary | ICD-10-CM

## 2017-09-28 MED ORDER — CYANOCOBALAMIN 1000 MCG/ML IJ SOLN
1000.0000 ug | Freq: Once | INTRAMUSCULAR | Status: AC
  Administered 2017-09-28: 1000 ug via INTRAMUSCULAR

## 2017-09-28 NOTE — Progress Notes (Signed)
Patient came in today to receive 1st weekly vitamin B12 injection. Patient received b12 in the right deltoid. He tolerated injection well. Patient also requested to have his blood pressure checked. BP was 120/64. Informed patient that he has 3 more weekly B12 before starting the monthly injections. Patient verbalized understanding.

## 2017-10-02 ENCOUNTER — Other Ambulatory Visit: Payer: Self-pay | Admitting: Family Medicine

## 2017-10-02 LAB — COMPLETE METABOLIC PANEL WITH GFR
AG Ratio: 1.2 (calc) (ref 1.0–2.5)
ALT: 15 U/L (ref 9–46)
AST: 18 U/L (ref 10–35)
Albumin: 3.8 g/dL (ref 3.6–5.1)
Alkaline phosphatase (APISO): 93 U/L (ref 40–115)
BUN: 23 mg/dL (ref 7–25)
CALCIUM: 9.3 mg/dL (ref 8.6–10.3)
CO2: 24 mmol/L (ref 20–32)
CREATININE: 0.72 mg/dL (ref 0.70–1.18)
Chloride: 106 mmol/L (ref 98–110)
GFR, EST NON AFRICAN AMERICAN: 90 mL/min/{1.73_m2} (ref >=60)
GFR, Est African American: 104 mL/min/{1.73_m2} (ref >=60)
GLOBULIN: 3.1 g/dL (ref 1.9–3.7)
Glucose, Bld: 156 mg/dL — ABNORMAL HIGH (ref 65–99)
Potassium: 4.2 mmol/L (ref 3.5–5.3)
Sodium: 138 mmol/L (ref 135–146)
Total Bilirubin: 0.5 mg/dL (ref 0.2–1.2)
Total Protein: 6.9 g/dL (ref 6.1–8.1)

## 2017-10-02 LAB — LIPID PANEL
Cholesterol: 97 mg/dL (ref <200)
HDL: 40 mg/dL — AB (ref >40)
LDL Cholesterol (Calc): 39 mg/dL (calc)
NON-HDL CHOLESTEROL (CALC): 57 mg/dL (ref <130)
Total CHOL/HDL Ratio: 2.4 (calc) (ref <5.0)
Triglycerides: 99 mg/dL (ref <150)

## 2017-10-02 LAB — CBC WITH DIFFERENTIAL/PLATELET
Basophils Absolute: 50 cells/uL (ref 0–200)
Basophils Relative: 0.9 %
EOS ABS: 151 {cells}/uL (ref 15–500)
Eosinophils Relative: 2.7 %
HCT: 34.5 % — ABNORMAL LOW (ref 38.5–50.0)
Hemoglobin: 11.5 g/dL — ABNORMAL LOW (ref 13.2–17.1)
Lymphs Abs: 874 cells/uL (ref 850–3900)
MCH: 28.9 pg (ref 27.0–33.0)
MCHC: 33.3 g/dL (ref 32.0–36.0)
MCV: 86.7 fL (ref 80.0–100.0)
MONOS PCT: 7.7 %
MPV: 13.2 fL — ABNORMAL HIGH (ref 7.5–12.5)
NEUTROS PCT: 73.1 %
Neutro Abs: 4094 cells/uL (ref 1500–7800)
PLATELETS: 114 10*3/uL — AB (ref 140–400)
RBC: 3.98 10*6/uL — ABNORMAL LOW (ref 4.20–5.80)
RDW: 13.3 % (ref 11.0–15.0)
TOTAL LYMPHOCYTE: 15.6 %
WBC: 5.6 10*3/uL (ref 3.8–10.8)
WBCMIX: 431 {cells}/uL (ref 200–950)

## 2017-10-02 LAB — METHYLMALONIC ACID, SERUM: METHYLMALONIC ACID, QUANT: 609 nmol/L — AB (ref 87–318)

## 2017-10-02 LAB — HEMOGLOBIN A1C
EAG (MMOL/L): 7.7 (calc)
Hgb A1c MFr Bld: 6.5 % of total Hgb — ABNORMAL HIGH (ref <5.7)
Mean Plasma Glucose: 140 (calc)

## 2017-10-02 LAB — TEST AUTHORIZATION

## 2017-10-02 LAB — TSH: TSH: 2.8 m[IU]/L (ref 0.40–4.50)

## 2017-10-02 LAB — VITAMIN B12: VITAMIN B 12: 233 pg/mL (ref 200–1100)

## 2017-10-06 ENCOUNTER — Ambulatory Visit (INDEPENDENT_AMBULATORY_CARE_PROVIDER_SITE_OTHER): Payer: Medicare Other

## 2017-10-06 DIAGNOSIS — E538 Deficiency of other specified B group vitamins: Secondary | ICD-10-CM | POA: Diagnosis not present

## 2017-10-06 MED ORDER — CYANOCOBALAMIN 1000 MCG/ML IJ SOLN
1000.0000 ug | Freq: Once | INTRAMUSCULAR | Status: AC
  Administered 2017-10-06: 1000 ug via INTRAMUSCULAR

## 2017-10-06 NOTE — Progress Notes (Signed)
Patient was in office for b12 injection. Patient received the injection in his left deltoid patient tolerated well.

## 2017-10-13 ENCOUNTER — Telehealth: Payer: Self-pay | Admitting: Family Medicine

## 2017-10-13 ENCOUNTER — Ambulatory Visit (INDEPENDENT_AMBULATORY_CARE_PROVIDER_SITE_OTHER): Payer: Medicare Other

## 2017-10-13 DIAGNOSIS — Z23 Encounter for immunization: Secondary | ICD-10-CM | POA: Diagnosis not present

## 2017-10-13 DIAGNOSIS — M7989 Other specified soft tissue disorders: Secondary | ICD-10-CM

## 2017-10-13 DIAGNOSIS — E538 Deficiency of other specified B group vitamins: Secondary | ICD-10-CM | POA: Diagnosis not present

## 2017-10-13 MED ORDER — CYANOCOBALAMIN 1000 MCG/ML IJ SOLN
1000.0000 ug | Freq: Once | INTRAMUSCULAR | Status: AC
  Administered 2017-10-13: 1000 ug via INTRAMUSCULAR

## 2017-10-13 NOTE — Telephone Encounter (Signed)
Which leg is swelling? We discussed so much at his last visit, I forgot.  I believe he is referring to venous dopplers of the legs to rule out dvt not a CT.  I am ok with ordering venous dopplers to rule out dvt of the affected leg.

## 2017-10-13 NOTE — Telephone Encounter (Signed)
Patient came in today for a nurse visit and stated that he was supposed to be sent for a CT for lower leg swelling. Looked in patient's chart and did not see a CT. Please advise?

## 2017-10-13 NOTE — Telephone Encounter (Signed)
Patient is scheduled for the venous doppler of left lower leg at [facility] on Friday 10/16/17 at 1:00 PM. Patient's daughter was notified of appointment and she verbalized understanding.

## 2017-10-13 NOTE — Telephone Encounter (Signed)
It was to his left lower leg. Will go ahead and place orders for venous dopplers

## 2017-10-14 ENCOUNTER — Ambulatory Visit (INDEPENDENT_AMBULATORY_CARE_PROVIDER_SITE_OTHER): Payer: Medicare Other | Admitting: Gastroenterology

## 2017-10-14 ENCOUNTER — Encounter: Payer: Self-pay | Admitting: Gastroenterology

## 2017-10-14 DIAGNOSIS — B182 Chronic viral hepatitis C: Secondary | ICD-10-CM | POA: Diagnosis not present

## 2017-10-14 DIAGNOSIS — K7460 Unspecified cirrhosis of liver: Secondary | ICD-10-CM

## 2017-10-14 DIAGNOSIS — C220 Liver cell carcinoma: Secondary | ICD-10-CM

## 2017-10-14 DIAGNOSIS — I2510 Atherosclerotic heart disease of native coronary artery without angina pectoris: Secondary | ICD-10-CM | POA: Diagnosis not present

## 2017-10-14 DIAGNOSIS — K219 Gastro-esophageal reflux disease without esophagitis: Secondary | ICD-10-CM | POA: Diagnosis not present
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
## 2017-10-14 NOTE — Patient Instructions (Addendum)
Contact [facility] REGARDING THE INCISION THAT HAS NOT HEALED. CALL ME IF YOU CANNOT GET A RESPONSE TO YOUR CONCERNS.  REPEAT LABS AND U/S IN 6 MOS.  FOLLOW UP IN 6 MOS.

## 2017-10-14 NOTE — Assessment & Plan Note (Signed)
WELL COMPENSATED DISEASE.  LABS/US IN 6 MOS. FOLLOW UP IN 6 MOS.

## 2017-10-14 NOTE — Assessment & Plan Note (Signed)
RECENT ABLATION JUL 2019.  REPEAT LABS/US IN 6 MOS. FOLLOW UP IN 6 MOS.

## 2017-10-14 NOTE — Assessment & Plan Note (Signed)
SYMPTOMS CONTROLLED/RESOLVED.  CONTINUE OMEPRAZOLE.  TAKE 30 MINUTES PRIOR TO YOUR MEALS TWICE DAILY. FOLLOW UP IN 6 MOS.

## 2017-10-15 NOTE — Progress Notes (Signed)
ON RECALL  °

## 2017-10-15 NOTE — Progress Notes (Signed)
cc'ed to pcp °

## 2017-10-16 ENCOUNTER — Ambulatory Visit (HOSPITAL_COMMUNITY)
Admission: RE | Admit: 2017-10-16 | Discharge: 2017-10-16 | Disposition: A | Discharge status: Home / Self Care | Payer: Medicare Other | Source: Ambulatory Visit | Attending: Family Medicine | Admitting: Family Medicine

## 2017-10-16 DIAGNOSIS — M7989 Other specified soft tissue disorders: Secondary | ICD-10-CM | POA: Diagnosis not present

## 2017-10-16 NOTE — Progress Notes (Signed)
LLE venous duplex prelim: negative for DVT.  Landry Mellow, RDMS, RVT    Attempted call report to Dr. Dennard Schaumann. Left message on voicemail.

## 2017-10-20 ENCOUNTER — Ambulatory Visit (INDEPENDENT_AMBULATORY_CARE_PROVIDER_SITE_OTHER): Payer: Medicare Other

## 2017-10-20 DIAGNOSIS — E538 Deficiency of other specified B group vitamins: Secondary | ICD-10-CM | POA: Diagnosis not present

## 2017-10-20 MED ORDER — CYANOCOBALAMIN 1000 MCG/ML IJ SOLN
1000.0000 ug | Freq: Once | INTRAMUSCULAR | Status: AC
  Administered 2017-10-20: 1000 ug via INTRAMUSCULAR

## 2017-10-20 NOTE — Progress Notes (Signed)
Patient was in office for his weekly b12 injection.Patient received injection in his left deltoid and tolerated well

## 2017-10-21 ENCOUNTER — Other Ambulatory Visit: Payer: Self-pay | Admitting: Family Medicine

## 2017-11-09 ENCOUNTER — Other Ambulatory Visit: Payer: Self-pay | Admitting: Family Medicine

## 2017-11-10 ENCOUNTER — Encounter: Payer: Medicare Other | Admitting: Internal Medicine

## 2017-11-10 DIAGNOSIS — L919 Hypertrophic disorder of the skin, unspecified: Secondary | ICD-10-CM | POA: Diagnosis not present

## 2017-11-10 DIAGNOSIS — L929 Granulomatous disorder of the skin and subcutaneous tissue, unspecified: Secondary | ICD-10-CM | POA: Diagnosis not present

## 2017-11-10 DIAGNOSIS — C220 Liver cell carcinoma: Secondary | ICD-10-CM | POA: Diagnosis not present

## 2017-11-18 ENCOUNTER — Other Ambulatory Visit: Payer: Self-pay | Admitting: Cardiology

## 2017-11-18 ENCOUNTER — Other Ambulatory Visit: Payer: Self-pay | Admitting: Internal Medicine

## 2017-11-18 ENCOUNTER — Other Ambulatory Visit: Payer: Self-pay | Admitting: *Deleted

## 2017-11-18 ENCOUNTER — Other Ambulatory Visit: Payer: Self-pay | Admitting: Family Medicine

## 2017-11-18 MED ORDER — GLIPIZIDE 5 MG PO TABS: ORAL_TABLET | ORAL | 3 refills | Status: DC

## 2017-11-19 ENCOUNTER — Ambulatory Visit (INDEPENDENT_AMBULATORY_CARE_PROVIDER_SITE_OTHER): Payer: Medicare Other

## 2017-11-19 ENCOUNTER — Encounter: Payer: Self-pay | Admitting: Family Medicine

## 2017-11-19 ENCOUNTER — Ambulatory Visit (INDEPENDENT_AMBULATORY_CARE_PROVIDER_SITE_OTHER): Payer: Medicare Other | Admitting: Family Medicine

## 2017-11-19 ENCOUNTER — Other Ambulatory Visit: Payer: Self-pay

## 2017-11-19 VITALS — BP 110/60 | HR 66 | Temp 97.8°F | Resp 16 | Ht 70.0 in | Wt 169.0 lb

## 2017-11-19 DIAGNOSIS — E538 Deficiency of other specified B group vitamins: Secondary | ICD-10-CM

## 2017-11-19 DIAGNOSIS — E119 Type 2 diabetes mellitus without complications: Secondary | ICD-10-CM | POA: Diagnosis not present

## 2017-11-19 DIAGNOSIS — I959 Hypotension, unspecified: Secondary | ICD-10-CM | POA: Diagnosis not present

## 2017-11-19 MED ORDER — CYANOCOBALAMIN 1000 MCG/ML IJ SOLN
1000.0000 ug | Freq: Once | INTRAMUSCULAR | Status: AC
  Administered 2017-11-19: 1000 ug via INTRAMUSCULAR

## 2017-11-19 MED ORDER — GLUCOSE BLOOD VI STRP: 1.0000 | ORAL_STRIP | Freq: Three times a day (TID) | 3 refills | Status: DC

## 2017-11-19 MED ORDER — ISOSORBIDE MONONITRATE ER 60 MG PO TB24: 60.0000 mg | ORAL_TABLET | Freq: Every day | ORAL | 0 refills | Status: DC

## 2017-11-19 NOTE — Progress Notes (Signed)
Patient ID: [name] [name], male    DOB: September 24, 1940, 78 y.o.   MRN: [id number]  PCP: [name], MD  Chief Complaint  Patient presents with  . Fluctuating BP    has been going up and down    Subjective:   [name] [name] is a 77 y.o. male, presents to clinic with CC of blood pressure concerns and some generalized weakness.  He has been monitoring his blood pressure and he states that has been going up and down.  He brings in her chart filled with readings from August 2019 to yesterday November 18, 2017.  He was here earlier obtaining his B12 shot and was squeezed into the schedule to be seen for his blood pressure concerns.  He is on metoprolol 50 mg indoor extended release 60 mg twice daily and lisinopril 5 mg.  His blood pressures range 152/57-97/46.  Patient simply states that he feels generally weak he concerned that maybe his sugar was low or that his heart rate or blood pressure are causing a problem.  He has been watching his heart rate as well heart rate is always in the 60-70's.  He reports eating drinking urinating like normal.  He has had no syncopal episodes.  He denies any chest pain, shortness of breath, vertigo. He gets regular B12 shots. He also has tear Zosyn to take in the evenings and nitroglycerin to take as needed for chest pain which he states he has not taken any of. Diabetes is managed with metformin Jardiance and glipizide, denies any hypoglycemia episodes he states that they typically run around 128 in the mornings  Patient Active Problem List   Diagnosis Date Noted  . Abdominal pain 05/01/2017  . GERD (gastroesophageal reflux disease) 04/02/2017  . PAF (paroxysmal atrial fibrillation) ([place]) 02/22/2017  . Chest pain, negative MI, meds adjusted 03/13/2016  . Normocytic anemia 01/17/2015  . Hepatocellular carcinoma ([place]) 11/04/2014  . Type II diabetes mellitus with neurological manifestations ([place]) 04/29/2013  . Tinnitus of both ears 04/29/2013  . CAD  (coronary artery disease) of artery bypass graft 01/31/2013  . Osteoarthritis of right knee 01/31/2013  . Ulceration 01/31/2013  . Unspecified chronic bronchitis ([place]) 01/31/2013  . Hepatic cirrhosis due to chronic hepatitis C infection ([place]) 12/27/2012  . Encounter for long-term (current) use of NSAIDs 12/10/2012  . Right knee pain 11/25/2012  . BPH (benign prostatic hyperplasia) 11/25/2012  . Vitamin B12 deficiency 11/25/2012  . Tinea 10/21/2012  . HLD (hyperlipidemia) 10/07/2012  . HTN (hypertension) 10/07/2012  . CAD S/P percutaneous coronary angioplasty 10/07/2012  . Diabetes mellitus ([place]) 10/07/2012  . Pacemaker 10/07/2012  . Folic acid deficiency 33/29/5188     Prior to Admission medications   Medication Sig Start Date End Date Taking? Authorizing Provider  aspirin 81 MG tablet Take 81 mg by mouth daily.    [provider]  atorvastatin (LIPITOR) 40 MG tablet TAKE ONE TABLET BY MOUTH ONCE DAILY. 11/18/17   [name], MD  folic acid (FOLVITE) 1 MG tablet TAKE (1) TABLET BY MOUTH ONCE DAILY. 05/13/17   [name], MD  glipiZIDE (GLUCOTROL) 5 MG tablet TAKE ONE TABLET BY MOUTH ONCE DAILY BEFORE BREAKFAST. 11/18/17   [name], MD  glucose blood test strip 1 each by Other route daily. Dispense per insurance and patient preference. 04/27/15   [name], MD  isosorbide mononitrate (IMDUR) 60 MG 24 hr tablet TAKE 1 TABLET BY MOUTH EVERY MORNING AND 1 TABLET AT SUPPER  TIME. Patient taking differently: 60mg  by mouth twice daily 11/24/16   Evans Lance, MD  JARDIANCE 25 MG TABS tablet TAKE ONE TABLET BY MOUTH ONCE DAILY. 11/09/17   [name], MD  Lancets 30G MISC 1 each by Does not apply route daily. Dispense per insurance and patient preference 04/27/15   [name], MD  lisinopril (PRINIVIL,ZESTRIL) 2.5 MG tablet TAKE ONE TABLET BY MOUTH DAILY. 11/18/17   Evans Lance, MD  metFORMIN (GLUCOPHAGE) 500 MG tablet TAKE 1 TABLET BY  MOUTH TWICE DAILY WITH A MEAL. 08/18/17   [name], MD  metoprolol tartrate (LOPRESSOR) 50 MG tablet Take 1 tablet (50 mg total) by mouth 2 (two) times daily. 09/24/17   [name], MD  nitroGLYCERIN (NITROSTAT) 0.4 MG SL tablet Place 1 tablet (0.4 mg total) under the tongue every 5 (five) minutes x 3 doses as needed for chest pain. Patient not taking: Reported on 10/14/2017 03/14/16   Isaiah Serge, NP  omeprazole (PRILOSEC) 20 MG capsule TAKE 1 CAPSULE BY MOUTH TWICE DAILY. Patient taking differently: 20mg  by mouth twice daily 12/29/16   Isaiah Serge, NP  terazosin (HYTRIN) 5 MG capsule Take 1 capsule (5 mg total) by mouth every evening. 11/04/16   [name], MD     No Known Allergies   Family History  Problem Relation Age of Onset  . Cancer Mother   . Colon cancer Neg Hx   . Colon polyps Neg Hx   . Esophageal cancer Neg Hx   . Stomach cancer Neg Hx      Social History   Socioeconomic History  . Marital status: Divorced    Spouse name: Not on file  . Number of children: Not on file  . Years of education: Not on file  . Highest education level: Not on file  Occupational History  . Not on file  Social Needs  . Financial resource strain: Not on file  . Food insecurity:    Worry: Not on file    Inability: Not on file  . Transportation needs:    Medical: Not on file    Non-medical: Not on file  Tobacco Use  . Smoking status: Former Smoker    Types: Cigarettes    Last attempt to quit: 02/04/2003    Years since quitting: 14.8  . Smokeless tobacco: Never Used  Substance and Sexual Activity  . Alcohol use: No    Comment: previous alcohol use-stopped 18 years ago before prison  . Drug use: No    Comment: previous IVDU of heroin  . Sexual activity: Not Currently  Lifestyle  . Physical activity:    Days per week: Not on file    Minutes per session: Not on file  . Stress: Not on file  Relationships  . Social connections:    Talks on phone: Not on  file    Gets together: Not on file    Attends religious service: Not on file    Active member of club or organization: Not on file    Attends meetings of clubs or organizations: Not on file    Relationship status: Not on file  . Intimate partner violence:    Fear of current or ex partner: Not on file    Emotionally abused: Not on file    Physically abused: Not on file    Forced sexual activity: Not on file  Other Topics Concern  . Not on file  Social History Narrative  Lives with daughter in [place], [place] and just recently got out of prison a month ago Aug 2014     Review of Systems  Constitutional: Negative.  Negative for activity change, appetite change, chills, diaphoresis, fatigue, fever and unexpected weight change.  HENT: Negative.   Eyes: Negative.   Respiratory: Negative.  Negative for apnea, cough, choking, chest tightness, shortness of breath and wheezing.   Cardiovascular: Negative.  Negative for chest pain, palpitations and leg swelling.  Gastrointestinal: Negative.  Negative for abdominal pain, constipation, diarrhea, nausea and vomiting.  Endocrine: Negative.  Negative for polydipsia, polyphagia and polyuria.  Genitourinary: Negative.   Musculoskeletal: Negative.   Skin: Negative.  Negative for color change, pallor and rash.  Allergic/Immunologic: Negative.   Neurological: Positive for weakness. Negative for dizziness, seizures, syncope, facial asymmetry, light-headedness, numbness and headaches.  Hematological: Negative.   Psychiatric/Behavioral: Negative.   10 Systems reviewed and are negative for acute change except as noted in the HPI.      Objective:    Vitals:   11/19/17 1420  BP: 110/60  Pulse: 66  Resp: 16  Temp: 97.8 F (36.6 C)  TempSrc: Oral  SpO2: 97%  Weight: 169 lb (76.7 kg)  Height: 5\' 10"  (1.778 m)      Physical Exam  Constitutional: He is oriented to person, place, and time. He appears well-developed and well-nourished.   Non-toxic appearance. He does not appear ill. No distress.  HENT:  Head: Normocephalic and atraumatic.  Right Ear: Tympanic membrane, external ear and ear canal normal.  Left Ear: Tympanic membrane, external ear and ear canal normal.  Nose: No mucosal edema or rhinorrhea. Right sinus exhibits no maxillary sinus tenderness and no frontal sinus tenderness. Left sinus exhibits no maxillary sinus tenderness and no frontal sinus tenderness.  Mouth/Throat: Uvula is midline. No trismus in the jaw. No uvula swelling. No oropharyngeal exudate, posterior oropharyngeal edema or posterior oropharyngeal erythema.  Eyes: Pupils are equal, round, and reactive to light. Conjunctivae, EOM and lids are normal.  Neck: Trachea normal, normal range of motion and phonation normal. Neck supple. No tracheal deviation present.  Cardiovascular: Regular rhythm, normal heart sounds and normal pulses. Exam reveals no gallop and no friction rub.  No murmur heard. Pulses:      Radial pulses are 2+ on the right side, and 2+ on the left side.       Posterior tibial pulses are 2+ on the right side, and 2+ on the left side.  Pulmonary/Chest: Effort normal and breath sounds normal. He has no wheezes. He has no rhonchi. He has no rales.  Abdominal: Soft. Normal appearance and bowel sounds are normal. He exhibits no distension. There is no tenderness. There is no rebound and no guarding.  Musculoskeletal: Normal range of motion. He exhibits no edema.  Neurological: He is alert and oriented to person, place, and time. Gait normal.  Skin: Skin is warm, dry and intact. Capillary refill takes less than 2 seconds. No rash noted. He is not diaphoretic.  Psychiatric: He has a normal mood and affect. His speech is normal and behavior is normal.  Nursing note and vitals reviewed.   Orthostatics done:    BP   SpO2  HR Laying   104/60  95  60  Sitting  98/60  97  62 Standing 108/64  96  64 Standing  112/68  97  66   Pt felt  lightheaded when going from laying to sitting    Assessment & Plan:  ICD-10-CM   1. Hypotension, unspecified hypotension type I95.9   2. Diabetes mellitus without complication (HCC) S34.1 glucose blood test strip  3. Fatigue, unspecified type R53.83     Patient with some blood pressure concerns he brings in several weeks of readings, in the last week his blood pressure readings on his chart have been softer than a month or 2 prior systolic blood pressure readings in the 90s and low 962I and diastolic readings in the 29N and 40s, rate appears to have been fairly stable in the 60s and 70s.  Had no syncopal episodes and denies any chest pain shortness of breath diaphoresis.   He does have diabetes but does not believe low sugars have been an issue, he is on glipizide but lowest reading he has had is been 128.  Feel that his symptoms may have multiple causes such as his B12 deficiency which she is being treated for, most likely is his blood pressure is too soft.  I reviewed the patient's medications, blood pressure readings and presentation with Dr. Dennard Schaumann his PCP, he instructed to have pt hold one dose of Imdur ER, he is on BID dosing.    Pt to only take 60 mg Imdur ER Q day, con't other meds and continue to monitor and follow up with PCP.  Patient was also given information about diabetes and symptoms of low blood sugar.  He was given more strips to check more often.  Reviewed with him the mechanism of action of glipizide and possible side effect of hypoglycemia, patient was instructed to monitor his blood sugars while taking this and also to only take it when he is eating, and if for any reason he is ill or skipping meals to also hold the glipizide so it does not cause low blood sugar.  Return precautions and ER precautions reviewed at length with the patient who verbalizes understanding.   Delsa Grana, PA-C 11/19/17 2:33 PM

## 2017-11-19 NOTE — Patient Instructions (Signed)
Adjust the imdur medication to take only one per day and continue to monitor your BP.  If your blood pressure goes up or you have chest pain you will need to contact our office of the cardiologist and resume the twice a day dosing.  Right now with a blood pressure readings that you have given to me and that we have had an office it does seem that your blood pressure is slightly too low and this may be contributing towards your symptoms  Low blood sugar can also give you some symptoms please see the handout given to you, I have prescribed more strips for you so you can hopefully check your blood sugar more often.  Glipizide medication does drop your blood sugar so you should only take it when you are eating breakfast in 1 year morning fasting sugars are over 120.   If your blood sugars are low, below 70 or you feel symptomatic drink some juice or take glucose tablets and repeat your blood sugar in 15 minutes.

## 2017-11-19 NOTE — Progress Notes (Signed)
Patient was in office for b12 injection.Patient received injection in his left deltoid.Patient tolerated well

## 2017-12-01 ENCOUNTER — Encounter: Payer: Self-pay | Admitting: Gastroenterology

## 2017-12-07 ENCOUNTER — Encounter: Payer: Self-pay | Admitting: Family Medicine

## 2017-12-07 ENCOUNTER — Ambulatory Visit (INDEPENDENT_AMBULATORY_CARE_PROVIDER_SITE_OTHER): Payer: Medicare Other | Admitting: *Deleted

## 2017-12-07 ENCOUNTER — Ambulatory Visit (INDEPENDENT_AMBULATORY_CARE_PROVIDER_SITE_OTHER): Payer: Medicare Other | Admitting: Family Medicine

## 2017-12-07 VITALS — BP 110/58 | HR 62 | Temp 97.9°F | Resp 12 | Ht 70.0 in | Wt 166.0 lb

## 2017-12-07 DIAGNOSIS — I959 Hypotension, unspecified: Secondary | ICD-10-CM | POA: Diagnosis not present

## 2017-12-07 DIAGNOSIS — I2510 Atherosclerotic heart disease of native coronary artery without angina pectoris: Secondary | ICD-10-CM | POA: Diagnosis not present

## 2017-12-07 DIAGNOSIS — R413 Other amnesia: Secondary | ICD-10-CM

## 2017-12-07 DIAGNOSIS — R001 Bradycardia, unspecified: Secondary | ICD-10-CM

## 2017-12-07 DIAGNOSIS — E119 Type 2 diabetes mellitus without complications: Secondary | ICD-10-CM | POA: Diagnosis not present

## 2017-12-07 MED ORDER — DONEPEZIL HCL 5 MG PO TABS: 5.0000 mg | ORAL_TABLET | Freq: Every day | ORAL | 3 refills | Status: DC

## 2017-12-07 NOTE — Progress Notes (Signed)
Remote pacemaker transmission.   
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
## 2017-12-10 ENCOUNTER — Encounter: Payer: Self-pay | Admitting: Cardiology

## 2017-12-10 ENCOUNTER — Other Ambulatory Visit: Payer: Self-pay | Admitting: Family Medicine

## 2017-12-23 ENCOUNTER — Ambulatory Visit (INDEPENDENT_AMBULATORY_CARE_PROVIDER_SITE_OTHER): Payer: Medicare Other | Admitting: Internal Medicine

## 2017-12-23 ENCOUNTER — Encounter: Payer: Self-pay | Admitting: Internal Medicine

## 2017-12-23 VITALS — BP 118/60 | HR 81 | Ht 70.0 in | Wt 166.0 lb

## 2017-12-23 DIAGNOSIS — R001 Bradycardia, unspecified: Secondary | ICD-10-CM

## 2017-12-23 DIAGNOSIS — R002 Palpitations: Secondary | ICD-10-CM | POA: Diagnosis not present

## 2017-12-23 DIAGNOSIS — I2510 Atherosclerotic heart disease of native coronary artery without angina pectoris: Secondary | ICD-10-CM | POA: Diagnosis not present

## 2017-12-23 DIAGNOSIS — Z950 Presence of cardiac pacemaker: Secondary | ICD-10-CM

## 2017-12-23 LAB — CUP PACEART INCLINIC DEVICE CHECK
Battery Impedance: 2209 Ohm
Brady Statistic AP VP Percent: 0 %
Brady Statistic AP VS Percent: 65 %
Brady Statistic AS VP Percent: 0 %
Implantable Lead Implant Date: 20100520
Implantable Lead Location: 753859
Implantable Lead Model: 4092
Implantable Lead Model: 5076
Lead Channel Impedance Value: 464 Ohm
Lead Channel Impedance Value: 789 Ohm
Lead Channel Pacing Threshold Amplitude: 1.25 V
Lead Channel Pacing Threshold Pulse Width: 0.4 ms
Lead Channel Pacing Threshold Pulse Width: 0.4 ms
Lead Channel Sensing Intrinsic Amplitude: 4 mV
Lead Channel Sensing Intrinsic Amplitude: 8 mV
Lead Channel Setting Pacing Amplitude: 2 V
Lead Channel Setting Pacing Pulse Width: 1 ms
Lead Channel Setting Sensing Sensitivity: 2.8 mV
MDC IDC LEAD IMPLANT DT: 20100520
MDC IDC LEAD LOCATION: 753860
MDC IDC MSMT BATTERY REMAINING LONGEVITY: 31 mo
MDC IDC MSMT BATTERY VOLTAGE: 2.75 V
MDC IDC MSMT LEADCHNL RA PACING THRESHOLD AMPLITUDE: 0.5 V
MDC IDC MSMT LEADCHNL RA PACING THRESHOLD AMPLITUDE: 0.5 V
MDC IDC MSMT LEADCHNL RA PACING THRESHOLD PULSEWIDTH: 0.4 ms
MDC IDC MSMT LEADCHNL RV PACING THRESHOLD AMPLITUDE: 1.75 V
MDC IDC MSMT LEADCHNL RV PACING THRESHOLD PULSEWIDTH: 1 ms
MDC IDC PG IMPLANT DT: 20100520
MDC IDC SESS DTM: 20191120120950
MDC IDC SET LEADCHNL RV PACING AMPLITUDE: 2.5 V
MDC IDC STAT BRADY AS VS PERCENT: 35 %
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
## 2017-12-23 NOTE — Patient Instructions (Signed)
Medication Instructions:  Your physician recommends that you continue on your current medications as directed. Please refer to the Current Medication list given to you today.  If you need a refill on your cardiac medications before your next appointment, please call your pharmacy.   Lab work: NONE  If you have labs (blood work) drawn today and your tests are completely normal, you will receive your results only by: . MyChart Message (if you have MyChart) OR . A paper copy in the mail If you have any lab test that is abnormal or we need to change your treatment, we will call you to review the results.  Testing/Procedures: NONE   Follow-Up: At [facility], you and your health needs are our priority.  As part of our continuing mission to provide you with exceptional heart care, we have created designated Provider Care Teams.  These Care Teams include your primary Cardiologist (physician) and Advanced Practice Providers (APPs -  Physician Assistants and Nurse Practitioners) who all work together to provide you with the care you need, when you need it. You will need a follow up appointment in 1 years.  Please call our office 2 months in advance to schedule this appointment.  You may see No primary care provider on file. or one of the following Advanced Practice Providers on your designated Care Team:   Brittany Strader, PA-C ([place] Office) . Michele Lenze, PA-C ([place] Office)  Any Other Special Instructions Will Be Listed Below (If Applicable). Thank you for choosing [facility]!     

## 2017-12-25 ENCOUNTER — Ambulatory Visit: Payer: Medicare Other | Admitting: Family Medicine

## 2017-12-25 ENCOUNTER — Other Ambulatory Visit: Payer: Self-pay | Admitting: Family Medicine

## 2017-12-25 DIAGNOSIS — I10 Essential (primary) hypertension: Secondary | ICD-10-CM

## 2017-12-25 DIAGNOSIS — E118 Type 2 diabetes mellitus with unspecified complications: Secondary | ICD-10-CM

## 2017-12-25 DIAGNOSIS — I2510 Atherosclerotic heart disease of native coronary artery without angina pectoris: Secondary | ICD-10-CM

## 2018-01-05 ENCOUNTER — Ambulatory Visit (INDEPENDENT_AMBULATORY_CARE_PROVIDER_SITE_OTHER): Payer: Medicare Other | Admitting: Family Medicine

## 2018-01-05 DIAGNOSIS — E538 Deficiency of other specified B group vitamins: Secondary | ICD-10-CM

## 2018-01-05 MED ORDER — CYANOCOBALAMIN 1000 MCG/ML IJ SOLN
1000.0000 ug | INTRAMUSCULAR | Status: DC
  Administered 2018-01-05 – 2020-03-07 (×22): 1000 ug via INTRAMUSCULAR

## 2018-01-08 ENCOUNTER — Ambulatory Visit (INDEPENDENT_AMBULATORY_CARE_PROVIDER_SITE_OTHER): Payer: Medicare Other | Admitting: Family Medicine

## 2018-01-08 ENCOUNTER — Encounter: Payer: Self-pay | Admitting: Family Medicine

## 2018-01-08 VITALS — BP 100/60 | HR 62 | Temp 97.8°F | Resp 18 | Ht 70.0 in | Wt 166.0 lb

## 2018-01-08 DIAGNOSIS — I2510 Atherosclerotic heart disease of native coronary artery without angina pectoris: Secondary | ICD-10-CM | POA: Diagnosis not present

## 2018-01-08 DIAGNOSIS — I10 Essential (primary) hypertension: Secondary | ICD-10-CM | POA: Diagnosis not present

## 2018-01-08 DIAGNOSIS — E119 Type 2 diabetes mellitus without complications: Secondary | ICD-10-CM

## 2018-01-08 DIAGNOSIS — R413 Other amnesia: Secondary | ICD-10-CM

## 2018-01-08 DIAGNOSIS — E118 Type 2 diabetes mellitus with unspecified complications: Secondary | ICD-10-CM

## 2018-01-08 DIAGNOSIS — E538 Deficiency of other specified B group vitamins: Secondary | ICD-10-CM | POA: Diagnosis not present

## 2018-01-08 MED ORDER — CLOTRIMAZOLE 1 % EX CREA: 1.0000 "application " | TOPICAL_CREAM | Freq: Two times a day (BID) | CUTANEOUS | 0 refills | Status: DC
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
## 2018-01-09 LAB — COMPLETE METABOLIC PANEL WITH GFR
AG Ratio: 1.5 (calc) (ref 1.0–2.5)
ALT: 24 U/L (ref 9–46)
AST: 21 U/L (ref 10–35)
Albumin: 4.1 g/dL (ref 3.6–5.1)
Alkaline phosphatase (APISO): 93 U/L (ref 40–115)
BUN/Creatinine Ratio: 32 (calc) — ABNORMAL HIGH (ref 6–22)
BUN: 22 mg/dL (ref 7–25)
CO2: 21 mmol/L (ref 20–32)
Calcium: 9.3 mg/dL (ref 8.6–10.3)
Chloride: 105 mmol/L (ref 98–110)
Creat: 0.69 mg/dL — ABNORMAL LOW (ref 0.70–1.18)
GFR, EST NON AFRICAN AMERICAN: 92 mL/min/{1.73_m2} (ref >=60)
GFR, Est African American: 106 mL/min/{1.73_m2} (ref >=60)
Globulin: 2.7 g/dL (calc) (ref 1.9–3.7)
Glucose, Bld: 150 mg/dL — ABNORMAL HIGH (ref 65–99)
Potassium: 4.4 mmol/L (ref 3.5–5.3)
Sodium: 136 mmol/L (ref 135–146)
TOTAL PROTEIN: 6.8 g/dL (ref 6.1–8.1)
Total Bilirubin: 0.5 mg/dL (ref 0.2–1.2)

## 2018-01-09 LAB — CBC WITH DIFFERENTIAL/PLATELET
Basophils Absolute: 41 cells/uL (ref 0–200)
Basophils Relative: 0.7 %
Eosinophils Absolute: 139 cells/uL (ref 15–500)
Eosinophils Relative: 2.4 %
HCT: 36.6 % — ABNORMAL LOW (ref 38.5–50.0)
Hemoglobin: 12.1 g/dL — ABNORMAL LOW (ref 13.2–17.1)
Lymphs Abs: 1143 cells/uL (ref 850–3900)
MCH: 28.3 pg (ref 27.0–33.0)
MCHC: 33.1 g/dL (ref 32.0–36.0)
MCV: 85.7 fL (ref 80.0–100.0)
MPV: 13.9 fL — ABNORMAL HIGH (ref 7.5–12.5)
Monocytes Relative: 6.3 %
NEUTROS PCT: 70.9 %
Neutro Abs: 4112 cells/uL (ref 1500–7800)
PLATELETS: 107 10*3/uL — AB (ref 140–400)
RBC: 4.27 10*6/uL (ref 4.20–5.80)
RDW: 14.6 % (ref 11.0–15.0)
TOTAL LYMPHOCYTE: 19.7 %
WBC mixed population: 365 cells/uL (ref 200–950)
WBC: 5.8 10*3/uL (ref 3.8–10.8)

## 2018-01-09 LAB — HEMOGLOBIN A1C
Hgb A1c MFr Bld: 6.7 % of total Hgb — ABNORMAL HIGH (ref <5.7)
Mean Plasma Glucose: 146 (calc)
eAG (mmol/L): 8.1 (calc)

## 2018-01-09 LAB — VITAMIN B12: Vitamin B-12: 625 pg/mL (ref 200–1100)

## 2018-01-22 LAB — CUP PACEART REMOTE DEVICE CHECK
Battery Impedance: 2183 Ohm
Battery Remaining Longevity: 32 mo
Battery Voltage: 2.75 V
Brady Statistic AP VP Percent: 0 %
Brady Statistic AP VS Percent: 63 %
Brady Statistic AS VP Percent: 0 %
Brady Statistic AS VS Percent: 37 %
Date Time Interrogation Session: 20191103181552
Implantable Lead Implant Date: 20100520
Implantable Lead Implant Date: 20100520
Implantable Lead Location: 753859
Implantable Lead Location: 753860
Implantable Lead Model: 4092
Implantable Lead Model: 5076
Implantable Pulse Generator Implant Date: 20100520
Lead Channel Impedance Value: 479 Ohm
Lead Channel Impedance Value: 783 Ohm
Lead Channel Pacing Threshold Amplitude: 0.5 V
Lead Channel Pacing Threshold Amplitude: 2.25 V
Lead Channel Pacing Threshold Pulse Width: 0.4 ms
Lead Channel Pacing Threshold Pulse Width: 0.4 ms
Lead Channel Setting Pacing Amplitude: 2 V
Lead Channel Setting Pacing Amplitude: 3.25 V
Lead Channel Setting Pacing Pulse Width: 1 ms
Lead Channel Setting Sensing Sensitivity: 2 mV

## 2018-02-04 ENCOUNTER — Ambulatory Visit (INDEPENDENT_AMBULATORY_CARE_PROVIDER_SITE_OTHER): Payer: Medicare Other | Admitting: *Deleted

## 2018-02-04 DIAGNOSIS — E538 Deficiency of other specified B group vitamins: Secondary | ICD-10-CM | POA: Diagnosis not present

## 2018-02-04 NOTE — Progress Notes (Signed)
Patient seen in office for Vitamin B 12 injection.   Tolerated IM administration well.  

## 2018-02-19 ENCOUNTER — Other Ambulatory Visit: Payer: Self-pay | Admitting: Internal Medicine

## 2018-02-19 ENCOUNTER — Other Ambulatory Visit: Payer: Self-pay | Admitting: Family Medicine

## 2018-02-20 ENCOUNTER — Other Ambulatory Visit: Payer: Self-pay | Admitting: *Deleted

## 2018-02-20 MED ORDER — TERAZOSIN HCL 5 MG PO CAPS: 5.0000 mg | ORAL_CAPSULE | Freq: Every evening | ORAL | 0 refills | Status: DC

## 2018-02-25 ENCOUNTER — Encounter: Payer: Self-pay | Admitting: Gastroenterology

## 2018-03-02 DIAGNOSIS — Z9049 Acquired absence of other specified parts of digestive tract: Secondary | ICD-10-CM | POA: Diagnosis not present

## 2018-03-02 DIAGNOSIS — Z9889 Other specified postprocedural states: Secondary | ICD-10-CM | POA: Diagnosis not present

## 2018-03-02 DIAGNOSIS — C220 Liver cell carcinoma: Secondary | ICD-10-CM | POA: Diagnosis not present

## 2018-03-04 ENCOUNTER — Ambulatory Visit: Payer: Medicare Other

## 2018-03-04 ENCOUNTER — Telehealth: Payer: Self-pay

## 2018-03-04 DIAGNOSIS — E119 Type 2 diabetes mellitus without complications: Secondary | ICD-10-CM | POA: Diagnosis not present

## 2018-03-04 DIAGNOSIS — B1920 Unspecified viral hepatitis C without hepatic coma: Secondary | ICD-10-CM | POA: Diagnosis not present

## 2018-03-04 DIAGNOSIS — C220 Liver cell carcinoma: Secondary | ICD-10-CM | POA: Diagnosis not present

## 2018-03-04 DIAGNOSIS — J449 Chronic obstructive pulmonary disease, unspecified: Secondary | ICD-10-CM | POA: Diagnosis not present

## 2018-03-04 NOTE — Telephone Encounter (Signed)
FYI: SF, pt walked in office to discuss the letter he received in the mail asking him to schedule his f/u. Pt stated he would like to f/u but has some appointments coming up with [facility]. Pts cancer has come back and he need treatment. Pt is aware that it's ok for him to finish his treatment for the cancer and contact our office when he is done to f/u.

## 2018-03-05 NOTE — Telephone Encounter (Signed)
REVIEWED. AGREE. NO ADDITIONAL RECOMMENDATIONS. 

## 2018-03-08 ENCOUNTER — Ambulatory Visit (INDEPENDENT_AMBULATORY_CARE_PROVIDER_SITE_OTHER): Payer: Medicare Other | Admitting: Family Medicine

## 2018-03-08 ENCOUNTER — Ambulatory Visit: Payer: Medicare Other

## 2018-03-08 DIAGNOSIS — E538 Deficiency of other specified B group vitamins: Secondary | ICD-10-CM | POA: Diagnosis not present

## 2018-03-09 ENCOUNTER — Ambulatory Visit (INDEPENDENT_AMBULATORY_CARE_PROVIDER_SITE_OTHER): Payer: Medicare Other

## 2018-03-09 DIAGNOSIS — R001 Bradycardia, unspecified: Secondary | ICD-10-CM | POA: Diagnosis not present

## 2018-03-09 DIAGNOSIS — R002 Palpitations: Secondary | ICD-10-CM

## 2018-03-10 DIAGNOSIS — C220 Liver cell carcinoma: Secondary | ICD-10-CM | POA: Diagnosis not present

## 2018-03-10 DIAGNOSIS — E119 Type 2 diabetes mellitus without complications: Secondary | ICD-10-CM | POA: Diagnosis not present

## 2018-03-10 DIAGNOSIS — J449 Chronic obstructive pulmonary disease, unspecified: Secondary | ICD-10-CM | POA: Diagnosis not present

## 2018-03-10 DIAGNOSIS — Z8619 Personal history of other infectious and parasitic diseases: Secondary | ICD-10-CM | POA: Diagnosis not present

## 2018-03-10 DIAGNOSIS — R932 Abnormal findings on diagnostic imaging of liver and biliary tract: Secondary | ICD-10-CM | POA: Diagnosis not present

## 2018-03-11 ENCOUNTER — Other Ambulatory Visit: Payer: Self-pay | Admitting: Family Medicine

## 2018-03-12 LAB — CUP PACEART REMOTE DEVICE CHECK
Battery Remaining Longevity: 26 mo
Battery Voltage: 2.75 V
Brady Statistic AP VS Percent: 87 %
Brady Statistic AS VP Percent: 0 %
Implantable Lead Implant Date: 20100520
Implantable Lead Location: 753859
Implantable Lead Location: 753860
Implantable Lead Model: 4092
Implantable Lead Model: 5076
Implantable Pulse Generator Implant Date: 20100520
Lead Channel Impedance Value: 442 Ohm
Lead Channel Pacing Threshold Amplitude: 0.5 V
Lead Channel Pacing Threshold Amplitude: 2 V
Lead Channel Pacing Threshold Pulse Width: 0.4 ms
Lead Channel Pacing Threshold Pulse Width: 0.4 ms
Lead Channel Setting Pacing Amplitude: 2 V
Lead Channel Setting Pacing Amplitude: 2.5 V
Lead Channel Setting Pacing Pulse Width: 1 ms
Lead Channel Setting Sensing Sensitivity: 2.8 mV
MDC IDC LEAD IMPLANT DT: 20100520
MDC IDC MSMT BATTERY IMPEDANCE: 2343 Ohm
MDC IDC MSMT LEADCHNL RV IMPEDANCE VALUE: 865 Ohm
MDC IDC SESS DTM: 20200204175416
MDC IDC STAT BRADY AP VP PERCENT: 0 %
MDC IDC STAT BRADY AS VS PERCENT: 13 %

## 2018-03-18 NOTE — Progress Notes (Signed)
Remote pacemaker transmission.   

## 2018-03-23 ENCOUNTER — Other Ambulatory Visit: Payer: Self-pay | Admitting: Cardiology

## 2018-03-26 DIAGNOSIS — J449 Chronic obstructive pulmonary disease, unspecified: Secondary | ICD-10-CM | POA: Diagnosis not present

## 2018-03-26 DIAGNOSIS — C220 Liver cell carcinoma: Secondary | ICD-10-CM | POA: Diagnosis not present

## 2018-03-26 DIAGNOSIS — B1920 Unspecified viral hepatitis C without hepatic coma: Secondary | ICD-10-CM | POA: Diagnosis not present

## 2018-03-26 DIAGNOSIS — E119 Type 2 diabetes mellitus without complications: Secondary | ICD-10-CM | POA: Diagnosis not present

## 2018-03-31 DIAGNOSIS — C220 Liver cell carcinoma: Secondary | ICD-10-CM | POA: Diagnosis not present

## 2018-04-05 ENCOUNTER — Ambulatory Visit: Payer: Medicare Other

## 2018-04-05 DIAGNOSIS — E538 Deficiency of other specified B group vitamins: Secondary | ICD-10-CM

## 2018-04-05 NOTE — Progress Notes (Signed)
Patient came in today to receive monthly B12 injection. Patient was given B12 in the left deltoid. He tolerated well. Instructed to follow up in 30 days.

## 2018-04-08 ENCOUNTER — Other Ambulatory Visit: Payer: Self-pay | Admitting: Family Medicine

## 2018-04-23 ENCOUNTER — Other Ambulatory Visit: Payer: Self-pay | Admitting: Cardiology

## 2018-05-05 ENCOUNTER — Ambulatory Visit: Payer: Medicare Other

## 2018-05-07 ENCOUNTER — Ambulatory Visit (INDEPENDENT_AMBULATORY_CARE_PROVIDER_SITE_OTHER): Payer: Medicare Other | Admitting: Family Medicine

## 2018-05-07 ENCOUNTER — Other Ambulatory Visit: Payer: Self-pay

## 2018-05-07 DIAGNOSIS — E538 Deficiency of other specified B group vitamins: Secondary | ICD-10-CM | POA: Diagnosis not present

## 2018-05-07 MED ORDER — DONEPEZIL HCL 5 MG PO TABS: ORAL_TABLET | ORAL | 3 refills | Status: DC

## 2018-05-07 MED ORDER — EMPAGLIFLOZIN 25 MG PO TABS: 25.0000 mg | ORAL_TABLET | Freq: Every day | ORAL | 3 refills | Status: DC

## 2018-05-14 ENCOUNTER — Other Ambulatory Visit: Payer: Self-pay | Admitting: Internal Medicine

## 2018-05-14 ENCOUNTER — Other Ambulatory Visit: Payer: Self-pay | Admitting: Family Medicine

## 2018-05-14 DIAGNOSIS — I2510 Atherosclerotic heart disease of native coronary artery without angina pectoris: Secondary | ICD-10-CM

## 2018-05-14 DIAGNOSIS — I10 Essential (primary) hypertension: Secondary | ICD-10-CM

## 2018-05-14 DIAGNOSIS — E118 Type 2 diabetes mellitus with unspecified complications: Secondary | ICD-10-CM

## 2018-06-08 ENCOUNTER — Other Ambulatory Visit: Payer: Self-pay

## 2018-06-08 ENCOUNTER — Ambulatory Visit (INDEPENDENT_AMBULATORY_CARE_PROVIDER_SITE_OTHER): Payer: Medicare Other

## 2018-06-08 DIAGNOSIS — E538 Deficiency of other specified B group vitamins: Secondary | ICD-10-CM

## 2018-06-08 NOTE — Progress Notes (Signed)
Patient came in today to receive monthly B12 injection. Vitamin B12 injection was given in the right deltoid. Patient tolerated well. Advised to return in 1 month for next injection.

## 2018-06-14 ENCOUNTER — Other Ambulatory Visit: Payer: Self-pay | Admitting: Cardiology

## 2018-06-14 NOTE — Telephone Encounter (Signed)
May refill once but need PCP appt to see if he still needs thanks.

## 2018-06-15 ENCOUNTER — Other Ambulatory Visit: Payer: Self-pay | Admitting: Family Medicine

## 2018-06-15 ENCOUNTER — Other Ambulatory Visit: Payer: Self-pay | Admitting: Cardiology

## 2018-06-15 ENCOUNTER — Ambulatory Visit (INDEPENDENT_AMBULATORY_CARE_PROVIDER_SITE_OTHER): Payer: Medicare Other | Admitting: *Deleted

## 2018-06-15 ENCOUNTER — Other Ambulatory Visit: Payer: Self-pay

## 2018-06-15 DIAGNOSIS — R001 Bradycardia, unspecified: Secondary | ICD-10-CM | POA: Diagnosis not present

## 2018-06-15 DIAGNOSIS — E118 Type 2 diabetes mellitus with unspecified complications: Secondary | ICD-10-CM

## 2018-06-15 DIAGNOSIS — R002 Palpitations: Secondary | ICD-10-CM

## 2018-06-15 DIAGNOSIS — I10 Essential (primary) hypertension: Secondary | ICD-10-CM

## 2018-06-15 DIAGNOSIS — I2510 Atherosclerotic heart disease of native coronary artery without angina pectoris: Secondary | ICD-10-CM

## 2018-06-16 LAB — CUP PACEART REMOTE DEVICE CHECK
Battery Impedance: 2353 Ohm
Battery Remaining Longevity: 26 mo
Battery Voltage: 2.74 V
Brady Statistic AP VP Percent: 0 %
Brady Statistic AP VS Percent: 84 %
Brady Statistic AS VP Percent: 0 %
Brady Statistic AS VS Percent: 16 %
Date Time Interrogation Session: 20200512204216
Implantable Lead Implant Date: 20100520
Implantable Lead Implant Date: 20100520
Implantable Lead Location: 753859
Implantable Lead Location: 753860
Implantable Lead Model: 4092
Implantable Lead Model: 5076
Implantable Pulse Generator Implant Date: 20100520
Lead Channel Impedance Value: 432 Ohm
Lead Channel Impedance Value: 925 Ohm
Lead Channel Pacing Threshold Amplitude: 0.5 V
Lead Channel Pacing Threshold Amplitude: 2.5 V
Lead Channel Pacing Threshold Pulse Width: 0.4 ms
Lead Channel Pacing Threshold Pulse Width: 0.4 ms
Lead Channel Setting Pacing Amplitude: 2 V
Lead Channel Setting Pacing Amplitude: 2.5 V
Lead Channel Setting Pacing Pulse Width: 1 ms
Lead Channel Setting Sensing Sensitivity: 2.8 mV

## 2018-06-22 DIAGNOSIS — Z0389 Encounter for observation for other suspected diseases and conditions ruled out: Secondary | ICD-10-CM | POA: Diagnosis not present

## 2018-06-22 DIAGNOSIS — C220 Liver cell carcinoma: Secondary | ICD-10-CM | POA: Diagnosis not present

## 2018-06-30 NOTE — Progress Notes (Signed)
Remote pacemaker transmission.   

## 2018-07-05 DIAGNOSIS — C220 Liver cell carcinoma: Secondary | ICD-10-CM | POA: Diagnosis not present

## 2018-07-09 DIAGNOSIS — T8131XA Disruption of external operation (surgical) wound, not elsewhere classified, initial encounter: Secondary | ICD-10-CM | POA: Insufficient documentation

## 2018-07-09 DIAGNOSIS — L919 Hypertrophic disorder of the skin, unspecified: Secondary | ICD-10-CM | POA: Diagnosis not present

## 2018-07-09 DIAGNOSIS — T8130XA Disruption of wound, unspecified, initial encounter: Secondary | ICD-10-CM | POA: Insufficient documentation

## 2018-07-09 DIAGNOSIS — T8130XD Disruption of wound, unspecified, subsequent encounter: Secondary | ICD-10-CM | POA: Diagnosis not present

## 2018-07-12 ENCOUNTER — Other Ambulatory Visit: Payer: Self-pay

## 2018-07-12 ENCOUNTER — Ambulatory Visit (INDEPENDENT_AMBULATORY_CARE_PROVIDER_SITE_OTHER): Payer: Medicare Other

## 2018-07-12 DIAGNOSIS — E538 Deficiency of other specified B group vitamins: Secondary | ICD-10-CM | POA: Diagnosis not present

## 2018-07-12 NOTE — Progress Notes (Signed)
B12 injection in L deltoid. Pt tolerated well.

## 2018-07-13 ENCOUNTER — Ambulatory Visit (INDEPENDENT_AMBULATORY_CARE_PROVIDER_SITE_OTHER): Payer: Medicare Other | Admitting: Family Medicine

## 2018-07-13 ENCOUNTER — Encounter: Payer: Self-pay | Admitting: Family Medicine

## 2018-07-13 VITALS — BP 110/60 | HR 64 | Temp 98.4°F | Resp 14 | Ht 70.0 in | Wt 161.0 lb

## 2018-07-13 DIAGNOSIS — I2510 Atherosclerotic heart disease of native coronary artery without angina pectoris: Secondary | ICD-10-CM | POA: Diagnosis not present

## 2018-07-13 DIAGNOSIS — F321 Major depressive disorder, single episode, moderate: Secondary | ICD-10-CM

## 2018-07-13 MED ORDER — ESCITALOPRAM OXALATE 10 MG PO TABS: 10.0000 mg | ORAL_TABLET | Freq: Every day | ORAL | 1 refills | Status: DC
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
## 2018-08-09 ENCOUNTER — Other Ambulatory Visit: Payer: Self-pay

## 2018-08-09 ENCOUNTER — Ambulatory Visit (INDEPENDENT_AMBULATORY_CARE_PROVIDER_SITE_OTHER): Payer: Medicare Other

## 2018-08-09 DIAGNOSIS — E538 Deficiency of other specified B group vitamins: Secondary | ICD-10-CM | POA: Diagnosis not present

## 2018-08-09 NOTE — Progress Notes (Signed)
Pt came in for a b12 injection. Given in R deltoid. Pt tolerated well

## 2018-08-11 ENCOUNTER — Other Ambulatory Visit: Payer: Self-pay | Admitting: Cardiology

## 2018-08-13 ENCOUNTER — Telehealth: Payer: Self-pay | Admitting: Family Medicine

## 2018-08-13 NOTE — Telephone Encounter (Signed)
We could up lexapro to 20 mg a day

## 2018-08-13 NOTE — Telephone Encounter (Signed)
Pt's daughter called and states that the pt is doing some better on the lexapro but not 100% yet. He still has days where he is really depressed but those have gotten to be fewer. She stated you wanted her to call for f/u.

## 2018-08-18 MED ORDER — ESCITALOPRAM OXALATE 20 MG PO TABS: 20.0000 mg | ORAL_TABLET | Freq: Every day | ORAL | 3 refills | Status: DC

## 2018-08-18 NOTE — Telephone Encounter (Signed)
Pt's daughter aware and and med sent to pharm for increased dose

## 2018-08-18 NOTE — Telephone Encounter (Signed)
LMTRC

## 2018-08-31 DIAGNOSIS — C220 Liver cell carcinoma: Secondary | ICD-10-CM | POA: Diagnosis not present

## 2018-09-01 ENCOUNTER — Telehealth: Payer: Self-pay | Admitting: Family Medicine

## 2018-09-01 MED ORDER — ESCITALOPRAM OXALATE 20 MG PO TABS: 20.0000 mg | ORAL_TABLET | Freq: Every day | ORAL | 3 refills | Status: DC

## 2018-09-01 NOTE — Telephone Encounter (Signed)
Refill on lexapro 20mg  to [facility]

## 2018-09-01 NOTE — Telephone Encounter (Signed)
Medication called/sent to requested pharmacy  

## 2018-09-10 ENCOUNTER — Encounter: Payer: Self-pay | Admitting: Family Medicine

## 2018-09-11 ENCOUNTER — Other Ambulatory Visit: Payer: Self-pay | Admitting: Family Medicine

## 2018-09-14 ENCOUNTER — Ambulatory Visit (INDEPENDENT_AMBULATORY_CARE_PROVIDER_SITE_OTHER): Payer: Medicare Other | Admitting: Family Medicine

## 2018-09-14 ENCOUNTER — Ambulatory Visit (INDEPENDENT_AMBULATORY_CARE_PROVIDER_SITE_OTHER): Payer: Medicare Other | Admitting: *Deleted

## 2018-09-14 ENCOUNTER — Other Ambulatory Visit: Payer: Self-pay

## 2018-09-14 DIAGNOSIS — I480 Paroxysmal atrial fibrillation: Secondary | ICD-10-CM | POA: Diagnosis not present

## 2018-09-14 DIAGNOSIS — E538 Deficiency of other specified B group vitamins: Secondary | ICD-10-CM | POA: Diagnosis not present

## 2018-09-16 LAB — CUP PACEART REMOTE DEVICE CHECK
Battery Impedance: 2832 Ohm
Battery Remaining Longevity: 21 mo
Battery Voltage: 2.73 V
Brady Statistic AP VP Percent: 0 %
Brady Statistic AP VS Percent: 89 %
Brady Statistic AS VP Percent: 0 %
Brady Statistic AS VS Percent: 11 %
Date Time Interrogation Session: 20200812203311
Implantable Lead Implant Date: 20100520
Implantable Lead Implant Date: 20100520
Implantable Lead Location: 753859
Implantable Lead Location: 753860
Implantable Lead Model: 4092
Implantable Lead Model: 5076
Implantable Pulse Generator Implant Date: 20100520
Lead Channel Impedance Value: 441 Ohm
Lead Channel Impedance Value: 913 Ohm
Lead Channel Pacing Threshold Amplitude: 0.5 V
Lead Channel Pacing Threshold Amplitude: 2.25 V
Lead Channel Pacing Threshold Pulse Width: 0.4 ms
Lead Channel Pacing Threshold Pulse Width: 0.4 ms
Lead Channel Setting Pacing Amplitude: 2 V
Lead Channel Setting Pacing Amplitude: 2.5 V
Lead Channel Setting Pacing Pulse Width: 1 ms
Lead Channel Setting Sensing Sensitivity: 2 mV

## 2018-09-22 ENCOUNTER — Other Ambulatory Visit: Payer: Self-pay | Admitting: Family Medicine

## 2018-09-22 ENCOUNTER — Other Ambulatory Visit: Payer: Self-pay | Admitting: Cardiology

## 2018-09-22 ENCOUNTER — Other Ambulatory Visit: Payer: Self-pay

## 2018-09-22 DIAGNOSIS — I2510 Atherosclerotic heart disease of native coronary artery without angina pectoris: Secondary | ICD-10-CM

## 2018-09-22 DIAGNOSIS — E118 Type 2 diabetes mellitus with unspecified complications: Secondary | ICD-10-CM

## 2018-09-22 DIAGNOSIS — I10 Essential (primary) hypertension: Secondary | ICD-10-CM

## 2018-09-23 ENCOUNTER — Encounter: Payer: Self-pay | Admitting: Family Medicine

## 2018-09-23 ENCOUNTER — Ambulatory Visit (INDEPENDENT_AMBULATORY_CARE_PROVIDER_SITE_OTHER): Payer: Medicare Other | Admitting: Family Medicine

## 2018-09-23 VITALS — BP 100/62 | HR 62 | Temp 97.9°F | Resp 14 | Ht 70.0 in | Wt 146.0 lb

## 2018-09-23 DIAGNOSIS — I2510 Atherosclerotic heart disease of native coronary artery without angina pectoris: Secondary | ICD-10-CM | POA: Diagnosis not present

## 2018-09-23 DIAGNOSIS — Z23 Encounter for immunization: Secondary | ICD-10-CM | POA: Diagnosis not present

## 2018-09-23 DIAGNOSIS — R634 Abnormal weight loss: Secondary | ICD-10-CM | POA: Diagnosis not present

## 2018-09-23 DIAGNOSIS — R7309 Other abnormal glucose: Secondary | ICD-10-CM | POA: Diagnosis not present

## 2018-09-23 DIAGNOSIS — F321 Major depressive disorder, single episode, moderate: Secondary | ICD-10-CM

## 2018-09-23 DIAGNOSIS — R413 Other amnesia: Secondary | ICD-10-CM | POA: Diagnosis not present

## 2018-09-23 MED ORDER — VENLAFAXINE HCL ER 75 MG PO CP24: 150.0000 mg | ORAL_CAPSULE | Freq: Every day | ORAL | 5 refills | Status: DC
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
## 2018-09-24 LAB — CBC WITH DIFFERENTIAL/PLATELET
Absolute Monocytes: 400 cells/uL (ref 200–950)
Basophils Absolute: 38 cells/uL (ref 0–200)
Basophils Relative: 0.8 %
Eosinophils Absolute: 71 cells/uL (ref 15–500)
Eosinophils Relative: 1.5 %
HCT: 35.7 % — ABNORMAL LOW (ref 38.5–50.0)
Hemoglobin: 11.2 g/dL — ABNORMAL LOW (ref 13.2–17.1)
Lymphs Abs: 700 cells/uL — ABNORMAL LOW (ref 850–3900)
MCH: 26.4 pg — ABNORMAL LOW (ref 27.0–33.0)
MCHC: 31.4 g/dL — ABNORMAL LOW (ref 32.0–36.0)
MCV: 84.2 fL (ref 80.0–100.0)
MPV: 13.9 fL — ABNORMAL HIGH (ref 7.5–12.5)
Monocytes Relative: 8.5 %
Neutro Abs: 3492 cells/uL (ref 1500–7800)
Neutrophils Relative %: 74.3 %
Platelets: 116 10*3/uL — ABNORMAL LOW (ref 140–400)
RBC: 4.24 10*6/uL (ref 4.20–5.80)
RDW: 16.7 % — ABNORMAL HIGH (ref 11.0–15.0)
Total Lymphocyte: 14.9 %
WBC: 4.7 10*3/uL (ref 3.8–10.8)

## 2018-09-24 LAB — COMPLETE METABOLIC PANEL WITH GFR
AG Ratio: 1.6 (calc) (ref 1.0–2.5)
ALT: 15 U/L (ref 9–46)
AST: 18 U/L (ref 10–35)
Albumin: 4.1 g/dL (ref 3.6–5.1)
Alkaline phosphatase (APISO): 74 U/L (ref 35–144)
BUN: 22 mg/dL (ref 7–25)
CO2: 26 mmol/L (ref 20–32)
Calcium: 10 mg/dL (ref 8.6–10.3)
Chloride: 101 mmol/L (ref 98–110)
Creat: 0.76 mg/dL (ref 0.70–1.18)
GFR, Est African American: 101 mL/min/{1.73_m2} (ref >=60)
GFR, Est Non African American: 87 mL/min/{1.73_m2} (ref >=60)
Globulin: 2.6 g/dL (calc) (ref 1.9–3.7)
Glucose, Bld: 89 mg/dL (ref 65–99)
Potassium: 4.7 mmol/L (ref 3.5–5.3)
Sodium: 138 mmol/L (ref 135–146)
Total Bilirubin: 0.5 mg/dL (ref 0.2–1.2)
Total Protein: 6.7 g/dL (ref 6.1–8.1)

## 2018-09-24 LAB — HEMOGLOBIN A1C
Hgb A1c MFr Bld: 6.1 % of total Hgb — ABNORMAL HIGH (ref <5.7)
Mean Plasma Glucose: 128 (calc)
eAG (mmol/L): 7.1 (calc)

## 2018-09-24 LAB — TSH: TSH: 3.25 mIU/L (ref 0.40–4.50)

## 2018-09-24 NOTE — Progress Notes (Signed)
[facility]link Summary Report / Loop Recorder 

## 2018-09-25 DIAGNOSIS — I2510 Atherosclerotic heart disease of native coronary artery without angina pectoris: Secondary | ICD-10-CM | POA: Diagnosis not present

## 2018-09-25 DIAGNOSIS — Z7984 Long term (current) use of oral hypoglycemic drugs: Secondary | ICD-10-CM | POA: Diagnosis not present

## 2018-09-25 DIAGNOSIS — B1920 Unspecified viral hepatitis C without hepatic coma: Secondary | ICD-10-CM | POA: Diagnosis not present

## 2018-09-25 DIAGNOSIS — F0390 Unspecified dementia without behavioral disturbance: Secondary | ICD-10-CM | POA: Diagnosis not present

## 2018-09-25 DIAGNOSIS — E785 Hyperlipidemia, unspecified: Secondary | ICD-10-CM | POA: Diagnosis not present

## 2018-09-25 DIAGNOSIS — K7460 Unspecified cirrhosis of liver: Secondary | ICD-10-CM | POA: Diagnosis not present

## 2018-09-25 DIAGNOSIS — D638 Anemia in other chronic diseases classified elsewhere: Secondary | ICD-10-CM | POA: Diagnosis not present

## 2018-09-25 DIAGNOSIS — F329 Major depressive disorder, single episode, unspecified: Secondary | ICD-10-CM | POA: Diagnosis not present

## 2018-09-25 DIAGNOSIS — I10 Essential (primary) hypertension: Secondary | ICD-10-CM | POA: Diagnosis not present

## 2018-09-25 DIAGNOSIS — Z87891 Personal history of nicotine dependence: Secondary | ICD-10-CM | POA: Diagnosis not present

## 2018-09-25 DIAGNOSIS — Z741 Need for assistance with personal care: Secondary | ICD-10-CM | POA: Diagnosis not present

## 2018-09-25 DIAGNOSIS — R634 Abnormal weight loss: Secondary | ICD-10-CM | POA: Diagnosis not present

## 2018-09-25 DIAGNOSIS — H9193 Unspecified hearing loss, bilateral: Secondary | ICD-10-CM | POA: Diagnosis not present

## 2018-09-25 DIAGNOSIS — I252 Old myocardial infarction: Secondary | ICD-10-CM | POA: Diagnosis not present

## 2018-09-25 DIAGNOSIS — Z950 Presence of cardiac pacemaker: Secondary | ICD-10-CM | POA: Diagnosis not present

## 2018-09-25 DIAGNOSIS — E1149 Type 2 diabetes mellitus with other diabetic neurological complication: Secondary | ICD-10-CM | POA: Diagnosis not present

## 2018-09-25 DIAGNOSIS — Z8505 Personal history of malignant neoplasm of liver: Secondary | ICD-10-CM | POA: Diagnosis not present

## 2018-09-25 DIAGNOSIS — Z955 Presence of coronary angioplasty implant and graft: Secondary | ICD-10-CM | POA: Diagnosis not present

## 2018-09-25 DIAGNOSIS — E538 Deficiency of other specified B group vitamins: Secondary | ICD-10-CM | POA: Diagnosis not present

## 2018-10-01 DIAGNOSIS — E1149 Type 2 diabetes mellitus with other diabetic neurological complication: Secondary | ICD-10-CM | POA: Diagnosis not present

## 2018-10-01 DIAGNOSIS — F329 Major depressive disorder, single episode, unspecified: Secondary | ICD-10-CM | POA: Diagnosis not present

## 2018-10-01 DIAGNOSIS — B1920 Unspecified viral hepatitis C without hepatic coma: Secondary | ICD-10-CM | POA: Diagnosis not present

## 2018-10-01 DIAGNOSIS — F0390 Unspecified dementia without behavioral disturbance: Secondary | ICD-10-CM | POA: Diagnosis not present

## 2018-10-01 DIAGNOSIS — K7460 Unspecified cirrhosis of liver: Secondary | ICD-10-CM | POA: Diagnosis not present

## 2018-10-01 DIAGNOSIS — R634 Abnormal weight loss: Secondary | ICD-10-CM | POA: Diagnosis not present

## 2018-10-06 DIAGNOSIS — F0390 Unspecified dementia without behavioral disturbance: Secondary | ICD-10-CM | POA: Diagnosis not present

## 2018-10-06 DIAGNOSIS — E1149 Type 2 diabetes mellitus with other diabetic neurological complication: Secondary | ICD-10-CM | POA: Diagnosis not present

## 2018-10-06 DIAGNOSIS — R634 Abnormal weight loss: Secondary | ICD-10-CM | POA: Diagnosis not present

## 2018-10-06 DIAGNOSIS — F329 Major depressive disorder, single episode, unspecified: Secondary | ICD-10-CM | POA: Diagnosis not present

## 2018-10-06 DIAGNOSIS — B1920 Unspecified viral hepatitis C without hepatic coma: Secondary | ICD-10-CM | POA: Diagnosis not present

## 2018-10-06 DIAGNOSIS — K7460 Unspecified cirrhosis of liver: Secondary | ICD-10-CM | POA: Diagnosis not present

## 2018-10-08 DIAGNOSIS — F0390 Unspecified dementia without behavioral disturbance: Secondary | ICD-10-CM | POA: Diagnosis not present

## 2018-10-08 DIAGNOSIS — F329 Major depressive disorder, single episode, unspecified: Secondary | ICD-10-CM | POA: Diagnosis not present

## 2018-10-08 DIAGNOSIS — B1920 Unspecified viral hepatitis C without hepatic coma: Secondary | ICD-10-CM | POA: Diagnosis not present

## 2018-10-08 DIAGNOSIS — R634 Abnormal weight loss: Secondary | ICD-10-CM | POA: Diagnosis not present

## 2018-10-08 DIAGNOSIS — K7460 Unspecified cirrhosis of liver: Secondary | ICD-10-CM | POA: Diagnosis not present

## 2018-10-08 DIAGNOSIS — E1149 Type 2 diabetes mellitus with other diabetic neurological complication: Secondary | ICD-10-CM | POA: Diagnosis not present

## 2018-10-12 DIAGNOSIS — F0390 Unspecified dementia without behavioral disturbance: Secondary | ICD-10-CM | POA: Diagnosis not present

## 2018-10-12 DIAGNOSIS — F329 Major depressive disorder, single episode, unspecified: Secondary | ICD-10-CM | POA: Diagnosis not present

## 2018-10-12 DIAGNOSIS — K7460 Unspecified cirrhosis of liver: Secondary | ICD-10-CM | POA: Diagnosis not present

## 2018-10-12 DIAGNOSIS — B1920 Unspecified viral hepatitis C without hepatic coma: Secondary | ICD-10-CM | POA: Diagnosis not present

## 2018-10-12 DIAGNOSIS — R634 Abnormal weight loss: Secondary | ICD-10-CM | POA: Diagnosis not present

## 2018-10-12 DIAGNOSIS — E1149 Type 2 diabetes mellitus with other diabetic neurological complication: Secondary | ICD-10-CM | POA: Diagnosis not present

## 2018-10-18 ENCOUNTER — Encounter: Payer: Self-pay | Admitting: Family Medicine

## 2018-10-18 ENCOUNTER — Ambulatory Visit (INDEPENDENT_AMBULATORY_CARE_PROVIDER_SITE_OTHER): Payer: Medicare Other

## 2018-10-18 ENCOUNTER — Other Ambulatory Visit: Payer: Self-pay

## 2018-10-18 DIAGNOSIS — F32A Depression, unspecified: Secondary | ICD-10-CM | POA: Insufficient documentation

## 2018-10-18 DIAGNOSIS — E538 Deficiency of other specified B group vitamins: Secondary | ICD-10-CM

## 2018-10-18 DIAGNOSIS — F329 Major depressive disorder, single episode, unspecified: Secondary | ICD-10-CM | POA: Insufficient documentation

## 2018-10-18 DIAGNOSIS — F0390 Unspecified dementia without behavioral disturbance: Secondary | ICD-10-CM | POA: Insufficient documentation

## 2018-10-18 NOTE — Progress Notes (Signed)
Patient came in today to receive monthly B12. Patient tolerated well. B12 given in the left deltoid.

## 2018-10-20 DIAGNOSIS — E1149 Type 2 diabetes mellitus with other diabetic neurological complication: Secondary | ICD-10-CM | POA: Diagnosis not present

## 2018-10-20 DIAGNOSIS — F329 Major depressive disorder, single episode, unspecified: Secondary | ICD-10-CM | POA: Diagnosis not present

## 2018-10-20 DIAGNOSIS — F0390 Unspecified dementia without behavioral disturbance: Secondary | ICD-10-CM | POA: Diagnosis not present

## 2018-10-20 DIAGNOSIS — R634 Abnormal weight loss: Secondary | ICD-10-CM | POA: Diagnosis not present

## 2018-10-20 DIAGNOSIS — B1920 Unspecified viral hepatitis C without hepatic coma: Secondary | ICD-10-CM | POA: Diagnosis not present

## 2018-10-20 DIAGNOSIS — K7460 Unspecified cirrhosis of liver: Secondary | ICD-10-CM | POA: Diagnosis not present

## 2018-10-25 DIAGNOSIS — E538 Deficiency of other specified B group vitamins: Secondary | ICD-10-CM | POA: Diagnosis not present

## 2018-10-25 DIAGNOSIS — Z8505 Personal history of malignant neoplasm of liver: Secondary | ICD-10-CM | POA: Diagnosis not present

## 2018-10-25 DIAGNOSIS — K7460 Unspecified cirrhosis of liver: Secondary | ICD-10-CM | POA: Diagnosis not present

## 2018-10-25 DIAGNOSIS — B1920 Unspecified viral hepatitis C without hepatic coma: Secondary | ICD-10-CM | POA: Diagnosis not present

## 2018-10-25 DIAGNOSIS — F0390 Unspecified dementia without behavioral disturbance: Secondary | ICD-10-CM | POA: Diagnosis not present

## 2018-10-25 DIAGNOSIS — F329 Major depressive disorder, single episode, unspecified: Secondary | ICD-10-CM | POA: Diagnosis not present

## 2018-10-25 DIAGNOSIS — I252 Old myocardial infarction: Secondary | ICD-10-CM | POA: Diagnosis not present

## 2018-10-25 DIAGNOSIS — E785 Hyperlipidemia, unspecified: Secondary | ICD-10-CM | POA: Diagnosis not present

## 2018-10-25 DIAGNOSIS — Z741 Need for assistance with personal care: Secondary | ICD-10-CM | POA: Diagnosis not present

## 2018-10-25 DIAGNOSIS — Z955 Presence of coronary angioplasty implant and graft: Secondary | ICD-10-CM | POA: Diagnosis not present

## 2018-10-25 DIAGNOSIS — H9193 Unspecified hearing loss, bilateral: Secondary | ICD-10-CM | POA: Diagnosis not present

## 2018-10-25 DIAGNOSIS — Z87891 Personal history of nicotine dependence: Secondary | ICD-10-CM | POA: Diagnosis not present

## 2018-10-25 DIAGNOSIS — D638 Anemia in other chronic diseases classified elsewhere: Secondary | ICD-10-CM | POA: Diagnosis not present

## 2018-10-25 DIAGNOSIS — I10 Essential (primary) hypertension: Secondary | ICD-10-CM | POA: Diagnosis not present

## 2018-10-25 DIAGNOSIS — R634 Abnormal weight loss: Secondary | ICD-10-CM | POA: Diagnosis not present

## 2018-10-25 DIAGNOSIS — I2510 Atherosclerotic heart disease of native coronary artery without angina pectoris: Secondary | ICD-10-CM | POA: Diagnosis not present

## 2018-10-25 DIAGNOSIS — Z7984 Long term (current) use of oral hypoglycemic drugs: Secondary | ICD-10-CM | POA: Diagnosis not present

## 2018-10-25 DIAGNOSIS — Z950 Presence of cardiac pacemaker: Secondary | ICD-10-CM | POA: Diagnosis not present

## 2018-10-25 DIAGNOSIS — E1149 Type 2 diabetes mellitus with other diabetic neurological complication: Secondary | ICD-10-CM | POA: Diagnosis not present

## 2018-11-01 ENCOUNTER — Other Ambulatory Visit: Payer: Self-pay

## 2018-11-02 ENCOUNTER — Ambulatory Visit (INDEPENDENT_AMBULATORY_CARE_PROVIDER_SITE_OTHER): Payer: Medicare Other | Admitting: Family Medicine

## 2018-11-02 ENCOUNTER — Encounter: Payer: Self-pay | Admitting: Family Medicine

## 2018-11-02 VITALS — BP 100/56 | HR 68 | Temp 98.3°F | Resp 12 | Ht 70.0 in | Wt 150.0 lb

## 2018-11-02 DIAGNOSIS — K5909 Other constipation: Secondary | ICD-10-CM

## 2018-11-02 DIAGNOSIS — R140 Abdominal distension (gaseous): Secondary | ICD-10-CM | POA: Diagnosis not present

## 2018-11-02 DIAGNOSIS — K219 Gastro-esophageal reflux disease without esophagitis: Secondary | ICD-10-CM | POA: Diagnosis not present

## 2018-11-02 DIAGNOSIS — I2510 Atherosclerotic heart disease of native coronary artery without angina pectoris: Secondary | ICD-10-CM

## 2018-11-02 DIAGNOSIS — M533 Sacrococcygeal disorders, not elsewhere classified: Secondary | ICD-10-CM

## 2018-11-02 MED ORDER — CLOTRIMAZOLE-BETAMETHASONE 1-0.05 % EX CREA: 1.0000 "application " | TOPICAL_CREAM | Freq: Two times a day (BID) | CUTANEOUS | 0 refills | Status: DC
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
## 2018-11-15 ENCOUNTER — Other Ambulatory Visit: Payer: Self-pay | Admitting: Family Medicine

## 2018-11-15 ENCOUNTER — Other Ambulatory Visit: Payer: Self-pay | Admitting: Internal Medicine

## 2018-11-15 MED ORDER — OMEPRAZOLE 20 MG PO CPDR: 20.0000 mg | DELAYED_RELEASE_CAPSULE | Freq: Two times a day (BID) | ORAL | 0 refills | Status: DC

## 2018-11-16 ENCOUNTER — Telehealth: Payer: Self-pay | Admitting: Family Medicine

## 2018-11-16 ENCOUNTER — Ambulatory Visit (INDEPENDENT_AMBULATORY_CARE_PROVIDER_SITE_OTHER): Payer: Medicare Other

## 2018-11-16 ENCOUNTER — Other Ambulatory Visit: Payer: Self-pay

## 2018-11-16 DIAGNOSIS — E538 Deficiency of other specified B group vitamins: Secondary | ICD-10-CM | POA: Diagnosis not present

## 2018-11-16 DIAGNOSIS — E119 Type 2 diabetes mellitus without complications: Secondary | ICD-10-CM

## 2018-11-16 MED ORDER — LANCETS 30G MISC: 1.0000 | Freq: Every day | 1 refills | Status: DC

## 2018-11-16 MED ORDER — GLUCOSE BLOOD VI STRP: 1.0000 | ORAL_STRIP | Freq: Three times a day (TID) | 1 refills | Status: DC

## 2018-11-16 NOTE — Telephone Encounter (Signed)
Daughter left vm states patient needs lancets and test strips called into [place].  CB# [id number]

## 2018-12-10 ENCOUNTER — Other Ambulatory Visit: Payer: Self-pay | Admitting: Family Medicine

## 2018-12-10 DIAGNOSIS — E119 Type 2 diabetes mellitus without complications: Secondary | ICD-10-CM

## 2018-12-10 MED ORDER — GLUCOSE BLOOD VI STRP: 1.0000 | ORAL_STRIP | Freq: Every day | 3 refills | Status: DC

## 2018-12-15 ENCOUNTER — Ambulatory Visit (INDEPENDENT_AMBULATORY_CARE_PROVIDER_SITE_OTHER): Payer: Medicare Other | Admitting: *Deleted

## 2018-12-15 DIAGNOSIS — I480 Paroxysmal atrial fibrillation: Secondary | ICD-10-CM

## 2018-12-15 DIAGNOSIS — R001 Bradycardia, unspecified: Secondary | ICD-10-CM

## 2018-12-16 ENCOUNTER — Telehealth: Payer: Self-pay

## 2018-12-16 NOTE — Telephone Encounter (Signed)
Left message for patient to remind of missed remote transmission.  

## 2018-12-17 LAB — CUP PACEART REMOTE DEVICE CHECK
Battery Impedance: 2889 Ohm
Battery Remaining Longevity: 20 mo
Battery Voltage: 2.73 V
Brady Statistic AP VP Percent: 0 %
Brady Statistic AP VS Percent: 90 %
Brady Statistic AS VP Percent: 0 %
Brady Statistic AS VS Percent: 10 %
Date Time Interrogation Session: 20201113175136
Implantable Lead Implant Date: 20100520
Implantable Lead Implant Date: 20100520
Implantable Lead Location: 753859
Implantable Lead Location: 753860
Implantable Lead Model: 4092
Implantable Lead Model: 5076
Implantable Pulse Generator Implant Date: 20100520
Lead Channel Impedance Value: 408 Ohm
Lead Channel Impedance Value: 945 Ohm
Lead Channel Pacing Threshold Amplitude: 0.5 V
Lead Channel Pacing Threshold Amplitude: 2 V
Lead Channel Pacing Threshold Pulse Width: 0.4 ms
Lead Channel Pacing Threshold Pulse Width: 0.4 ms
Lead Channel Setting Pacing Amplitude: 2 V
Lead Channel Setting Pacing Amplitude: 2.5 V
Lead Channel Setting Pacing Pulse Width: 1 ms
Lead Channel Setting Sensing Sensitivity: 2 mV

## 2018-12-22 ENCOUNTER — Other Ambulatory Visit: Payer: Self-pay | Admitting: Internal Medicine

## 2018-12-27 ENCOUNTER — Ambulatory Visit (INDEPENDENT_AMBULATORY_CARE_PROVIDER_SITE_OTHER): Payer: Medicare Other

## 2018-12-27 ENCOUNTER — Other Ambulatory Visit: Payer: Self-pay

## 2018-12-27 DIAGNOSIS — E538 Deficiency of other specified B group vitamins: Secondary | ICD-10-CM

## 2018-12-29 ENCOUNTER — Other Ambulatory Visit: Payer: Self-pay

## 2019-01-06 NOTE — Progress Notes (Signed)
Remote pacemaker transmission.   

## 2019-01-21 ENCOUNTER — Other Ambulatory Visit: Payer: Self-pay | Admitting: Family Medicine

## 2019-01-21 ENCOUNTER — Other Ambulatory Visit: Payer: Self-pay | Admitting: Internal Medicine

## 2019-01-24 ENCOUNTER — Ambulatory Visit (INDEPENDENT_AMBULATORY_CARE_PROVIDER_SITE_OTHER): Payer: Medicare Other

## 2019-01-24 ENCOUNTER — Other Ambulatory Visit: Payer: Self-pay

## 2019-01-24 DIAGNOSIS — E538 Deficiency of other specified B group vitamins: Secondary | ICD-10-CM

## 2019-02-07 DIAGNOSIS — C220 Liver cell carcinoma: Secondary | ICD-10-CM | POA: Diagnosis not present

## 2019-02-14 ENCOUNTER — Other Ambulatory Visit: Payer: Self-pay | Admitting: Internal Medicine

## 2019-02-14 ENCOUNTER — Other Ambulatory Visit: Payer: Self-pay | Admitting: Family Medicine

## 2019-02-15 NOTE — Telephone Encounter (Signed)
This is a  pt.  °

## 2019-02-17 ENCOUNTER — Ambulatory Visit: Payer: Medicare Other | Attending: Internal Medicine

## 2019-02-17 DIAGNOSIS — Z23 Encounter for immunization: Secondary | ICD-10-CM | POA: Insufficient documentation

## 2019-02-17 NOTE — Progress Notes (Signed)
   Covid-19 Vaccination Clinic  Name:  [name] [name]    MRN: [id number] DOB: 12/17/1941  02/17/2019  [name] was observed post Covid-19 immunization for 15 minutes without incidence. He was provided with Vaccine Information Sheet and instruction to access the V-Safe system.   [name] was instructed to call 911 with any severe reactions post vaccine: Marland Kitchen Difficulty breathing  . Swelling of your face and throat  . A fast heartbeat  . A bad rash all over your body  . Dizziness and weakness    Immunizations Administered    Name Date Dose VIS Date Route   Pfizer COVID-19 Vaccine 02/17/2019  8:44 AM 0.3 mL 01/14/2019 Intramuscular   Manufacturer: [place]   Lot: [id number]   [place]: [id number]

## 2019-02-18 ENCOUNTER — Other Ambulatory Visit: Payer: Self-pay | Admitting: Internal Medicine

## 2019-03-09 ENCOUNTER — Ambulatory Visit: Payer: Medicare Other

## 2019-03-10 ENCOUNTER — Ambulatory Visit (INDEPENDENT_AMBULATORY_CARE_PROVIDER_SITE_OTHER): Payer: Medicare Other | Admitting: *Deleted

## 2019-03-10 ENCOUNTER — Other Ambulatory Visit: Payer: Self-pay

## 2019-03-10 DIAGNOSIS — E538 Deficiency of other specified B group vitamins: Secondary | ICD-10-CM | POA: Diagnosis not present

## 2019-03-10 MED ORDER — CYANOCOBALAMIN 1000 MCG/ML IJ SOLN
1000.0000 ug | Freq: Once | INTRAMUSCULAR | Status: AC
  Administered 2019-03-10: 1000 ug via INTRAMUSCULAR

## 2019-03-10 NOTE — Progress Notes (Signed)
Patient seen in office for Vitamin B 12 injection.   Tolerated IM administration well.  

## 2019-03-16 ENCOUNTER — Ambulatory Visit (INDEPENDENT_AMBULATORY_CARE_PROVIDER_SITE_OTHER): Payer: Medicare Other | Admitting: *Deleted

## 2019-03-16 DIAGNOSIS — I480 Paroxysmal atrial fibrillation: Secondary | ICD-10-CM | POA: Diagnosis not present

## 2019-03-17 LAB — CUP PACEART REMOTE DEVICE CHECK
Battery Impedance: 3391 Ohm
Battery Remaining Longevity: 16 mo
Battery Voltage: 2.71 V
Brady Statistic AP VP Percent: 0 %
Brady Statistic AP VS Percent: 91 %
Brady Statistic AS VP Percent: 0 %
Brady Statistic AS VS Percent: 9 %
Date Time Interrogation Session: 20210210173229
Implantable Lead Implant Date: 20100520
Implantable Lead Implant Date: 20100520
Implantable Lead Location: 753859
Implantable Lead Location: 753860
Implantable Lead Model: 4092
Implantable Lead Model: 5076
Implantable Pulse Generator Implant Date: 20100520
Lead Channel Impedance Value: 1000 Ohm
Lead Channel Impedance Value: 386 Ohm
Lead Channel Pacing Threshold Amplitude: 0.5 V
Lead Channel Pacing Threshold Amplitude: 2.5 V
Lead Channel Pacing Threshold Pulse Width: 0.4 ms
Lead Channel Pacing Threshold Pulse Width: 0.4 ms
Lead Channel Setting Pacing Amplitude: 2 V
Lead Channel Setting Pacing Amplitude: 2.5 V
Lead Channel Setting Pacing Pulse Width: 1 ms
Lead Channel Setting Sensing Sensitivity: 2 mV

## 2019-03-17 NOTE — Progress Notes (Signed)
PPM Remote  

## 2019-03-18 ENCOUNTER — Other Ambulatory Visit: Payer: Self-pay | Admitting: Family Medicine

## 2019-03-18 ENCOUNTER — Other Ambulatory Visit: Payer: Self-pay | Admitting: Internal Medicine

## 2019-03-18 DIAGNOSIS — E118 Type 2 diabetes mellitus with unspecified complications: Secondary | ICD-10-CM

## 2019-03-18 DIAGNOSIS — I10 Essential (primary) hypertension: Secondary | ICD-10-CM

## 2019-03-18 DIAGNOSIS — I2510 Atherosclerotic heart disease of native coronary artery without angina pectoris: Secondary | ICD-10-CM

## 2019-03-23 ENCOUNTER — Other Ambulatory Visit: Payer: Self-pay | Admitting: Family Medicine

## 2019-04-03 ENCOUNTER — Ambulatory Visit: Payer: Medicare Other | Attending: Internal Medicine

## 2019-04-03 DIAGNOSIS — Z23 Encounter for immunization: Secondary | ICD-10-CM

## 2019-04-03 NOTE — Progress Notes (Signed)
   Covid-19 Vaccination Clinic  Name:  [name] [name]    MRN: [id number] DOB: 10/15/1940  04/03/2019  [name] [name] was observed post Covid-19 immunization for 15 minutes without incidence. He was provided with Vaccine Information Sheet and instruction to access the V-Safe system.   [name] [name] was instructed to call 911 with any severe reactions post vaccine: Marland Kitchen Difficulty breathing  . Swelling of your face and throat  . A fast heartbeat  . A bad rash all over your body  . Dizziness and weakness    Immunizations Administered    Name Date Dose VIS Date Route   Pfizer COVID-19 Vaccine 04/03/2019 11:24 AM 0.3 mL 01/14/2019 Intramuscular   Manufacturer: [place]   Lot: [id number]   [place]: [id number]

## 2019-04-08 ENCOUNTER — Encounter: Payer: Self-pay | Admitting: Family Medicine

## 2019-04-08 ENCOUNTER — Other Ambulatory Visit: Payer: Self-pay

## 2019-04-08 ENCOUNTER — Ambulatory Visit (INDEPENDENT_AMBULATORY_CARE_PROVIDER_SITE_OTHER): Payer: Medicare Other | Admitting: Family Medicine

## 2019-04-08 VITALS — BP 100/56 | HR 68 | Temp 97.1°F | Resp 16 | Ht 70.0 in | Wt 163.0 lb

## 2019-04-08 DIAGNOSIS — R634 Abnormal weight loss: Secondary | ICD-10-CM | POA: Diagnosis not present

## 2019-04-08 DIAGNOSIS — I2510 Atherosclerotic heart disease of native coronary artery without angina pectoris: Secondary | ICD-10-CM | POA: Diagnosis not present

## 2019-04-08 DIAGNOSIS — F322 Major depressive disorder, single episode, severe without psychotic features: Secondary | ICD-10-CM | POA: Diagnosis not present

## 2019-04-08 DIAGNOSIS — E119 Type 2 diabetes mellitus without complications: Secondary | ICD-10-CM | POA: Diagnosis not present

## 2019-04-08 DIAGNOSIS — F0150 Vascular dementia without behavioral disturbance: Secondary | ICD-10-CM | POA: Diagnosis not present

## 2019-04-08 DIAGNOSIS — E538 Deficiency of other specified B group vitamins: Secondary | ICD-10-CM

## 2019-04-08 DIAGNOSIS — D649 Anemia, unspecified: Secondary | ICD-10-CM | POA: Diagnosis not present

## 2019-04-08 DIAGNOSIS — C220 Liver cell carcinoma: Secondary | ICD-10-CM | POA: Diagnosis not present

## 2019-04-08 MED ORDER — BUPROPION HCL ER (XL) 150 MG PO TB24: 150.0000 mg | ORAL_TABLET | Freq: Every day | ORAL | 3 refills | Status: DC
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
## 2019-04-11 ENCOUNTER — Other Ambulatory Visit: Payer: Self-pay | Admitting: Family Medicine

## 2019-04-11 DIAGNOSIS — D649 Anemia, unspecified: Secondary | ICD-10-CM

## 2019-04-12 ENCOUNTER — Other Ambulatory Visit: Payer: Self-pay

## 2019-04-12 ENCOUNTER — Other Ambulatory Visit: Payer: Medicare Other

## 2019-04-12 DIAGNOSIS — D649 Anemia, unspecified: Secondary | ICD-10-CM

## 2019-04-12 LAB — COMPLETE METABOLIC PANEL WITH GFR
AG Ratio: 1.3 (calc) (ref 1.0–2.5)
ALT: 11 U/L (ref 9–46)
AST: 15 U/L (ref 10–35)
Albumin: 3.9 g/dL (ref 3.6–5.1)
Alkaline phosphatase (APISO): 84 U/L (ref 35–144)
BUN: 23 mg/dL (ref 7–25)
CO2: 27 mmol/L (ref 20–32)
Calcium: 9.6 mg/dL (ref 8.6–10.3)
Chloride: 104 mmol/L (ref 98–110)
Creat: 0.84 mg/dL (ref 0.70–1.18)
GFR, Est African American: 97 mL/min/{1.73_m2} (ref >=60)
GFR, Est Non African American: 83 mL/min/{1.73_m2} (ref >=60)
Globulin: 2.9 g/dL (calc) (ref 1.9–3.7)
Glucose, Bld: 115 mg/dL — ABNORMAL HIGH (ref 65–99)
Potassium: 4.7 mmol/L (ref 3.5–5.3)
Sodium: 138 mmol/L (ref 135–146)
Total Bilirubin: 0.3 mg/dL (ref 0.2–1.2)
Total Protein: 6.8 g/dL (ref 6.1–8.1)

## 2019-04-12 LAB — CBC WITH DIFFERENTIAL/PLATELET
Absolute Monocytes: 405 cells/uL (ref 200–950)
Basophils Absolute: 50 cells/uL (ref 0–200)
Basophils Relative: 1 %
Eosinophils Absolute: 140 cells/uL (ref 15–500)
Eosinophils Relative: 2.8 %
HCT: 25.1 % — ABNORMAL LOW (ref 38.5–50.0)
Hemoglobin: 7.5 g/dL — ABNORMAL LOW (ref 13.2–17.1)
Lymphs Abs: 780 cells/uL — ABNORMAL LOW (ref 850–3900)
MCH: 22.3 pg — ABNORMAL LOW (ref 27.0–33.0)
MCHC: 29.9 g/dL — ABNORMAL LOW (ref 32.0–36.0)
MCV: 74.7 fL — ABNORMAL LOW (ref 80.0–100.0)
Monocytes Relative: 8.1 %
Neutro Abs: 3625 cells/uL (ref 1500–7800)
Neutrophils Relative %: 72.5 %
Platelets: 103 10*3/uL — ABNORMAL LOW (ref 140–400)
RBC: 3.36 10*6/uL — ABNORMAL LOW (ref 4.20–5.80)
RDW: 14.4 % (ref 11.0–15.0)
Total Lymphocyte: 15.6 %
WBC: 5 10*3/uL (ref 3.8–10.8)

## 2019-04-12 LAB — VITAMIN B12: Vitamin B-12: >2000 pg/mL — ABNORMAL HIGH (ref 200–1100)

## 2019-04-12 LAB — IRON,TIBC AND FERRITIN PANEL
%SAT: 4 % (calc) — ABNORMAL LOW (ref 20–48)
Ferritin: 4 ng/mL — ABNORMAL LOW (ref 24–380)
Iron: 16 ug/dL — ABNORMAL LOW (ref 50–180)
TIBC: 381 mcg/dL (calc) (ref 250–425)

## 2019-04-12 LAB — LIPID PANEL
Cholesterol: 103 mg/dL (ref <200)
HDL: 51 mg/dL (ref >=40)
LDL Cholesterol (Calc): 37 mg/dL (calc)
Non-HDL Cholesterol (Calc): 52 mg/dL (calc) (ref <130)
Total CHOL/HDL Ratio: 2 (calc) (ref <5.0)
Triglycerides: 71 mg/dL (ref <150)

## 2019-04-12 LAB — HEMOGLOBIN A1C
Hgb A1c MFr Bld: 6.2 % of total Hgb — ABNORMAL HIGH (ref <5.7)
Mean Plasma Glucose: 131 (calc)
eAG (mmol/L): 7.3 (calc)

## 2019-04-12 LAB — TEST AUTHORIZATION

## 2019-04-13 ENCOUNTER — Other Ambulatory Visit: Payer: Medicare Other

## 2019-04-13 DIAGNOSIS — D649 Anemia, unspecified: Secondary | ICD-10-CM | POA: Diagnosis not present

## 2019-04-14 ENCOUNTER — Other Ambulatory Visit: Payer: Self-pay | Admitting: Family Medicine

## 2019-04-14 DIAGNOSIS — D649 Anemia, unspecified: Secondary | ICD-10-CM

## 2019-04-14 LAB — FECAL GLOBIN BY IMMUNOCHEMISTRY
FECAL GLOBIN RESULT:: NOT DETECTED
MICRO NUMBER:: 10235867
SPECIMEN QUALITY:: ADEQUATE

## 2019-04-20 ENCOUNTER — Other Ambulatory Visit: Payer: Self-pay | Admitting: Internal Medicine

## 2019-04-23 ENCOUNTER — Ambulatory Visit: Payer: Medicare Other

## 2019-05-02 ENCOUNTER — Other Ambulatory Visit: Payer: Self-pay

## 2019-05-02 ENCOUNTER — Ambulatory Visit (INDEPENDENT_AMBULATORY_CARE_PROVIDER_SITE_OTHER): Payer: Medicare Other | Admitting: *Deleted

## 2019-05-02 DIAGNOSIS — E538 Deficiency of other specified B group vitamins: Secondary | ICD-10-CM

## 2019-05-02 DIAGNOSIS — E539 Vitamin B deficiency, unspecified: Secondary | ICD-10-CM

## 2019-05-03 ENCOUNTER — Ambulatory Visit: Payer: Medicare Other | Attending: Internal Medicine

## 2019-05-03 DIAGNOSIS — Z23 Encounter for immunization: Secondary | ICD-10-CM

## 2019-05-03 NOTE — Progress Notes (Signed)
   Covid-19 Vaccination Clinic  Name:  [name]    MRN: [id number] DOB: 02-Oct-1940  05/03/2019  [name] was observed post Covid-19 immunization for 15 minutes without incident. He was provided with Vaccine Information Sheet and instruction to access the V-Safe system.   [name] was instructed to call 911 with any severe reactions post vaccine: Marland Kitchen Difficulty breathing  . Swelling of face and throat  . A fast heartbeat  . A bad rash all over body  . Dizziness and weakness   Immunizations Administered    Name Date Dose VIS Date Route   Pfizer COVID-19 Vaccine 05/03/2019 11:29 AM 0.3 mL 01/14/2019 Intramuscular   Manufacturer: [place]   Lot: [id number]   [place]: [id number]

## 2019-05-17 ENCOUNTER — Other Ambulatory Visit: Payer: Self-pay | Admitting: Internal Medicine

## 2019-06-03 ENCOUNTER — Ambulatory Visit (INDEPENDENT_AMBULATORY_CARE_PROVIDER_SITE_OTHER): Payer: Medicare Other

## 2019-06-03 ENCOUNTER — Other Ambulatory Visit: Payer: Self-pay

## 2019-06-03 DIAGNOSIS — E538 Deficiency of other specified B group vitamins: Secondary | ICD-10-CM | POA: Diagnosis not present

## 2019-06-03 DIAGNOSIS — E539 Vitamin B deficiency, unspecified: Secondary | ICD-10-CM

## 2019-06-13 ENCOUNTER — Telehealth: Payer: Self-pay | Admitting: Family Medicine

## 2019-06-13 NOTE — Chronic Care Management (AMB) (Signed)
°  Chronic Care Management   Outreach Note  06/13/2019 Name: [name] [name] MRN: [id number] DOB: [birth year]  Referred by: [name], MD Reason for referral : Chronic Care Management   An unsuccessful telephone outreach was attempted today. The patient was referred to the pharmacist for assistance with care management and care coordination.   Follow Up Plan:   [place]

## 2019-06-15 ENCOUNTER — Telehealth: Payer: Self-pay

## 2019-06-15 NOTE — Telephone Encounter (Signed)
Unable to speak  with patient to remind of missed remote transmission 

## 2019-06-22 ENCOUNTER — Other Ambulatory Visit: Payer: Self-pay | Admitting: Internal Medicine

## 2019-06-30 ENCOUNTER — Other Ambulatory Visit: Payer: Self-pay

## 2019-06-30 ENCOUNTER — Ambulatory Visit (INDEPENDENT_AMBULATORY_CARE_PROVIDER_SITE_OTHER): Payer: Medicare Other

## 2019-06-30 DIAGNOSIS — E538 Deficiency of other specified B group vitamins: Secondary | ICD-10-CM

## 2019-06-30 DIAGNOSIS — E539 Vitamin B deficiency, unspecified: Secondary | ICD-10-CM

## 2019-07-06 ENCOUNTER — Ambulatory Visit (INDEPENDENT_AMBULATORY_CARE_PROVIDER_SITE_OTHER): Payer: Medicare Other | Admitting: *Deleted

## 2019-07-06 DIAGNOSIS — I480 Paroxysmal atrial fibrillation: Secondary | ICD-10-CM | POA: Diagnosis not present

## 2019-07-06 LAB — CUP PACEART REMOTE DEVICE CHECK
Battery Impedance: 4151 Ohm
Battery Remaining Longevity: 11 mo
Battery Voltage: 2.69 V
Brady Statistic AP VP Percent: 0 %
Brady Statistic AP VS Percent: 93 %
Brady Statistic AS VP Percent: 0 %
Brady Statistic AS VS Percent: 7 %
Date Time Interrogation Session: 20210601203818
Implantable Lead Implant Date: 20100520
Implantable Lead Implant Date: 20100520
Implantable Lead Location: 753859
Implantable Lead Location: 753860
Implantable Lead Model: 4092
Implantable Lead Model: 5076
Implantable Pulse Generator Implant Date: 20100520
Lead Channel Impedance Value: 1103 Ohm
Lead Channel Impedance Value: 404 Ohm
Lead Channel Pacing Threshold Amplitude: 0.5 V
Lead Channel Pacing Threshold Amplitude: 2 V
Lead Channel Pacing Threshold Pulse Width: 0.4 ms
Lead Channel Pacing Threshold Pulse Width: 0.4 ms
Lead Channel Setting Pacing Amplitude: 2 V
Lead Channel Setting Pacing Amplitude: 2.5 V
Lead Channel Setting Pacing Pulse Width: 1 ms
Lead Channel Setting Sensing Sensitivity: 2.8 mV

## 2019-07-08 NOTE — Progress Notes (Signed)
Remote pacemaker transmission.   

## 2019-07-14 ENCOUNTER — Other Ambulatory Visit: Payer: Self-pay | Admitting: Family Medicine

## 2019-08-02 ENCOUNTER — Other Ambulatory Visit: Payer: Self-pay

## 2019-08-02 ENCOUNTER — Ambulatory Visit (INDEPENDENT_AMBULATORY_CARE_PROVIDER_SITE_OTHER): Payer: Medicare Other | Admitting: *Deleted

## 2019-08-02 ENCOUNTER — Other Ambulatory Visit (INDEPENDENT_AMBULATORY_CARE_PROVIDER_SITE_OTHER): Payer: Medicare Other

## 2019-08-02 DIAGNOSIS — E539 Vitamin B deficiency, unspecified: Secondary | ICD-10-CM

## 2019-08-02 DIAGNOSIS — E538 Deficiency of other specified B group vitamins: Secondary | ICD-10-CM

## 2019-08-02 MED ORDER — CYANOCOBALAMIN 1000 MCG/ML IJ SOLN
1000.0000 ug | Freq: Once | INTRAMUSCULAR | Status: AC
  Administered 2019-08-02: 1000 ug via INTRAMUSCULAR

## 2019-08-16 ENCOUNTER — Other Ambulatory Visit: Payer: Self-pay | Admitting: Family Medicine

## 2019-09-02 ENCOUNTER — Other Ambulatory Visit: Payer: Self-pay

## 2019-09-02 ENCOUNTER — Ambulatory Visit (INDEPENDENT_AMBULATORY_CARE_PROVIDER_SITE_OTHER): Payer: Medicare Other | Admitting: *Deleted

## 2019-09-02 DIAGNOSIS — E538 Deficiency of other specified B group vitamins: Secondary | ICD-10-CM | POA: Diagnosis not present

## 2019-09-02 DIAGNOSIS — E539 Vitamin B deficiency, unspecified: Secondary | ICD-10-CM

## 2019-09-02 NOTE — Progress Notes (Signed)
Patient seen in office for Vitamin B 12 injection.   Tolerated IM administration well.  

## 2019-09-22 ENCOUNTER — Other Ambulatory Visit: Payer: Self-pay | Admitting: Internal Medicine

## 2019-09-22 NOTE — Telephone Encounter (Signed)
This is a [place] pt.  °

## 2019-10-03 ENCOUNTER — Other Ambulatory Visit: Payer: Self-pay

## 2019-10-03 ENCOUNTER — Ambulatory Visit (INDEPENDENT_AMBULATORY_CARE_PROVIDER_SITE_OTHER): Payer: Medicare Other

## 2019-10-03 DIAGNOSIS — E538 Deficiency of other specified B group vitamins: Secondary | ICD-10-CM

## 2019-10-03 NOTE — Progress Notes (Signed)
Pt came in for b12 injection. Administered in L deltoid. Pt tolerated well. 

## 2019-10-05 ENCOUNTER — Ambulatory Visit (INDEPENDENT_AMBULATORY_CARE_PROVIDER_SITE_OTHER): Payer: Medicare Other | Admitting: *Deleted

## 2019-10-05 DIAGNOSIS — I480 Paroxysmal atrial fibrillation: Secondary | ICD-10-CM

## 2019-10-06 ENCOUNTER — Telehealth: Payer: Self-pay | Admitting: Family Medicine

## 2019-10-06 LAB — CUP PACEART REMOTE DEVICE CHECK
Battery Impedance: 5562 Ohm
Battery Remaining Longevity: 3 mo
Battery Voltage: 2.66 V
Brady Statistic AP VP Percent: 0 %
Brady Statistic AP VS Percent: 95 %
Brady Statistic AS VP Percent: 0 %
Brady Statistic AS VS Percent: 5 %
Date Time Interrogation Session: 20210901180737
Implantable Lead Implant Date: 20100520
Implantable Lead Implant Date: 20100520
Implantable Lead Location: 753859
Implantable Lead Location: 753860
Implantable Lead Model: 4092
Implantable Lead Model: 5076
Implantable Pulse Generator Implant Date: 20100520
Lead Channel Impedance Value: 1157 Ohm
Lead Channel Impedance Value: 426 Ohm
Lead Channel Pacing Threshold Amplitude: 0.375 V
Lead Channel Pacing Threshold Amplitude: 2 V
Lead Channel Pacing Threshold Pulse Width: 0.4 ms
Lead Channel Pacing Threshold Pulse Width: 0.4 ms
Lead Channel Setting Pacing Amplitude: 2 V
Lead Channel Setting Pacing Amplitude: 2.5 V
Lead Channel Setting Pacing Pulse Width: 1 ms
Lead Channel Setting Sensing Sensitivity: 2.8 mV

## 2019-10-06 NOTE — Progress Notes (Signed)
  Chronic Care Management   Outreach Note  10/06/2019 Name: [name] [name] MRN: [id number] DOB: 29-Feb-1940  Referred by: [name], MD Reason for referral : No chief complaint on file.   An unsuccessful telephone outreach was attempted today. The patient was referred to the pharmacist for assistance with care management and care coordination.   Follow Up Plan:   Carley Perdue [employer] Scheduler

## 2019-10-07 NOTE — Progress Notes (Signed)
Remote pacemaker transmission.   

## 2019-10-13 ENCOUNTER — Telehealth: Payer: Self-pay | Admitting: Family Medicine

## 2019-10-13 NOTE — Progress Notes (Signed)
  Chronic Care Management   Outreach Note  10/13/2019 Name: [name] [name] MRN: [id number] DOB: December 07, 1940  Referred by: [name], MD Reason for referral : No chief complaint on file.   A second unsuccessful telephone outreach was attempted today. The patient was referred to pharmacist for assistance with care management and care coordination.  Follow Up Plan:   Carley Perdue [employer] Scheduler

## 2019-10-20 ENCOUNTER — Other Ambulatory Visit: Payer: Self-pay | Admitting: Family Medicine

## 2019-10-21 ENCOUNTER — Telehealth: Payer: Self-pay | Admitting: Family Medicine

## 2019-10-21 NOTE — Progress Notes (Signed)
  Chronic Care Management   Outreach Note  10/21/2019 Name: [name] [name] MRN: [id number] DOB: [birth year]  Referred by: [name], MD Reason for referral : No chief complaint on file.   An unsuccessful telephone outreach was attempted today. The patient was referred to the pharmacist for assistance with care management and care coordination.   Follow Up Plan:   Carley Perdue [employer] Sched[facility]

## 2019-11-03 ENCOUNTER — Ambulatory Visit (INDEPENDENT_AMBULATORY_CARE_PROVIDER_SITE_OTHER): Payer: Medicare Other | Admitting: *Deleted

## 2019-11-03 ENCOUNTER — Other Ambulatory Visit: Payer: Self-pay

## 2019-11-03 DIAGNOSIS — Z23 Encounter for immunization: Secondary | ICD-10-CM

## 2019-11-03 DIAGNOSIS — E538 Deficiency of other specified B group vitamins: Secondary | ICD-10-CM

## 2019-11-21 ENCOUNTER — Other Ambulatory Visit: Payer: Self-pay | Admitting: Internal Medicine

## 2019-12-01 ENCOUNTER — Other Ambulatory Visit: Payer: Self-pay

## 2019-12-01 ENCOUNTER — Ambulatory Visit (INDEPENDENT_AMBULATORY_CARE_PROVIDER_SITE_OTHER): Payer: Medicare Other

## 2019-12-01 DIAGNOSIS — E539 Vitamin B deficiency, unspecified: Secondary | ICD-10-CM

## 2019-12-01 DIAGNOSIS — E538 Deficiency of other specified B group vitamins: Secondary | ICD-10-CM

## 2019-12-26 ENCOUNTER — Other Ambulatory Visit: Payer: Self-pay | Admitting: Internal Medicine

## 2019-12-26 ENCOUNTER — Other Ambulatory Visit: Payer: Self-pay | Admitting: Family Medicine

## 2019-12-26 DIAGNOSIS — E118 Type 2 diabetes mellitus with unspecified complications: Secondary | ICD-10-CM

## 2019-12-26 DIAGNOSIS — I2510 Atherosclerotic heart disease of native coronary artery without angina pectoris: Secondary | ICD-10-CM

## 2019-12-26 DIAGNOSIS — I10 Essential (primary) hypertension: Secondary | ICD-10-CM

## 2020-01-03 ENCOUNTER — Other Ambulatory Visit: Payer: Self-pay

## 2020-01-03 ENCOUNTER — Ambulatory Visit (INDEPENDENT_AMBULATORY_CARE_PROVIDER_SITE_OTHER): Payer: Medicare Other

## 2020-01-03 DIAGNOSIS — E538 Deficiency of other specified B group vitamins: Secondary | ICD-10-CM | POA: Diagnosis not present

## 2020-01-04 ENCOUNTER — Ambulatory Visit (INDEPENDENT_AMBULATORY_CARE_PROVIDER_SITE_OTHER): Payer: Medicare Other

## 2020-01-04 DIAGNOSIS — I480 Paroxysmal atrial fibrillation: Secondary | ICD-10-CM

## 2020-01-09 LAB — CUP PACEART REMOTE DEVICE CHECK
Battery Impedance: 6700 Ohm
Battery Remaining Longevity: 1 mo — CL
Battery Voltage: 2.62 V
Brady Statistic AP VP Percent: 0 %
Brady Statistic AP VS Percent: 96 %
Brady Statistic AS VP Percent: 0 %
Brady Statistic AS VS Percent: 4 %
Date Time Interrogation Session: 20211203130027
Implantable Lead Implant Date: 20100520
Implantable Lead Implant Date: 20100520
Implantable Lead Location: 753859
Implantable Lead Location: 753860
Implantable Lead Model: 4092
Implantable Lead Model: 5076
Implantable Pulse Generator Implant Date: 20100520
Lead Channel Impedance Value: 1133 Ohm
Lead Channel Impedance Value: 408 Ohm
Lead Channel Pacing Threshold Amplitude: 0.5 V
Lead Channel Pacing Threshold Amplitude: 2 V
Lead Channel Pacing Threshold Pulse Width: 0.4 ms
Lead Channel Pacing Threshold Pulse Width: 0.4 ms
Lead Channel Setting Pacing Amplitude: 2 V
Lead Channel Setting Pacing Amplitude: 2.5 V
Lead Channel Setting Pacing Pulse Width: 1 ms
Lead Channel Setting Sensing Sensitivity: 2.8 mV

## 2020-01-11 NOTE — Progress Notes (Signed)
Remote pacemaker transmission.   

## 2020-01-12 DIAGNOSIS — C220 Liver cell carcinoma: Secondary | ICD-10-CM | POA: Diagnosis not present

## 2020-01-16 DIAGNOSIS — C220 Liver cell carcinoma: Secondary | ICD-10-CM | POA: Diagnosis not present

## 2020-01-21 ENCOUNTER — Other Ambulatory Visit: Payer: Self-pay | Admitting: Internal Medicine

## 2020-01-21 ENCOUNTER — Other Ambulatory Visit: Payer: Self-pay | Admitting: Family Medicine

## 2020-02-08 ENCOUNTER — Ambulatory Visit (INDEPENDENT_AMBULATORY_CARE_PROVIDER_SITE_OTHER): Payer: Medicare Other | Admitting: *Deleted

## 2020-02-08 ENCOUNTER — Other Ambulatory Visit: Payer: Self-pay

## 2020-02-08 DIAGNOSIS — E538 Deficiency of other specified B group vitamins: Secondary | ICD-10-CM | POA: Diagnosis not present

## 2020-02-17 ENCOUNTER — Telehealth: Payer: Self-pay | Admitting: Family Medicine

## 2020-02-17 MED ORDER — ATORVASTATIN CALCIUM 40 MG PO TABS: 40.0000 mg | ORAL_TABLET | Freq: Every day | ORAL | 1 refills | Status: DC

## 2020-02-17 MED ORDER — LISINOPRIL 2.5 MG PO TABS
2.5000 mg | ORAL_TABLET | Freq: Every day | ORAL | 1 refills | Status: DC
Start: 2020-02-17 — End: 2020-08-17

## 2020-02-17 MED ORDER — METFORMIN HCL 500 MG PO TABS: ORAL_TABLET | ORAL | 1 refills | Status: DC

## 2020-02-17 MED ORDER — VENLAFAXINE HCL ER 75 MG PO CP24: 150.0000 mg | ORAL_CAPSULE | Freq: Every day | ORAL | 1 refills | Status: DC

## 2020-02-17 MED ORDER — BUPROPION HCL ER (XL) 150 MG PO TB24: 150.0000 mg | ORAL_TABLET | Freq: Every day | ORAL | 1 refills | Status: DC

## 2020-02-17 NOTE — Telephone Encounter (Signed)
Pt came in the office today he spoke with the pharmacy [place], [place] on doctor fax back the refill request Refill on Lisinopril,Metformin HCL,Venlafaxine HCL ER,Atorvastatin,Bupropion HCL XL Please would like for someone to please give him call make sure medications was sent in PER PT HE OUT OF HIS MEDS

## 2020-02-17 NOTE — Telephone Encounter (Signed)
Prescription sent to pharmacy.

## 2020-02-27 DIAGNOSIS — Z20822 Contact with and (suspected) exposure to covid-19: Secondary | ICD-10-CM | POA: Diagnosis not present

## 2020-03-07 ENCOUNTER — Ambulatory Visit (INDEPENDENT_AMBULATORY_CARE_PROVIDER_SITE_OTHER): Payer: Medicare Other | Admitting: *Deleted

## 2020-03-07 DIAGNOSIS — E538 Deficiency of other specified B group vitamins: Secondary | ICD-10-CM | POA: Diagnosis not present

## 2020-03-07 MED ORDER — OMEPRAZOLE 20 MG PO CPDR: 20.0000 mg | DELAYED_RELEASE_CAPSULE | Freq: Two times a day (BID) | ORAL | 3 refills | Status: DC

## 2020-03-09 ENCOUNTER — Other Ambulatory Visit: Payer: Self-pay

## 2020-03-09 ENCOUNTER — Encounter: Payer: Self-pay | Admitting: Family Medicine

## 2020-03-09 ENCOUNTER — Ambulatory Visit (INDEPENDENT_AMBULATORY_CARE_PROVIDER_SITE_OTHER): Payer: Medicare Other | Admitting: Family Medicine

## 2020-03-09 VITALS — BP 120/60 | HR 66 | Temp 98.0°F | Ht 70.0 in | Wt 188.0 lb

## 2020-03-09 DIAGNOSIS — E119 Type 2 diabetes mellitus without complications: Secondary | ICD-10-CM

## 2020-03-09 DIAGNOSIS — D649 Anemia, unspecified: Secondary | ICD-10-CM | POA: Diagnosis not present

## 2020-03-09 DIAGNOSIS — E538 Deficiency of other specified B group vitamins: Secondary | ICD-10-CM

## 2020-03-09 MED ORDER — FLUTICASONE PROPIONATE 50 MCG/ACT NA SUSP: 2.0000 | Freq: Every day | NASAL | 6 refills | Status: DC

## 2020-03-09 MED ORDER — NEOMYCIN-POLYMYXIN-HC 3.5-10000-1 OT SOLN: 4.0000 [drp] | Freq: Four times a day (QID) | OTIC | 0 refills | Status: DC
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
## 2020-03-10 LAB — CBC WITH DIFFERENTIAL/PLATELET
Absolute Monocytes: 489 cells/uL (ref 200–950)
Basophils Absolute: 31 cells/uL (ref 0–200)
Basophils Relative: 0.6 %
Eosinophils Absolute: 109 cells/uL (ref 15–500)
Eosinophils Relative: 2.1 %
HCT: 33.8 % — ABNORMAL LOW (ref 38.5–50.0)
Hemoglobin: 11.4 g/dL — ABNORMAL LOW (ref 13.2–17.1)
Lymphs Abs: 681 cells/uL — ABNORMAL LOW (ref 850–3900)
MCH: 30.2 pg (ref 27.0–33.0)
MCHC: 33.7 g/dL (ref 32.0–36.0)
MCV: 89.4 fL (ref 80.0–100.0)
MPV: 13.5 fL — ABNORMAL HIGH (ref 7.5–12.5)
Monocytes Relative: 9.4 %
Neutro Abs: 3890 cells/uL (ref 1500–7800)
Neutrophils Relative %: 74.8 %
Platelets: 119 10*3/uL — ABNORMAL LOW (ref 140–400)
RBC: 3.78 10*6/uL — ABNORMAL LOW (ref 4.20–5.80)
RDW: 12.7 % (ref 11.0–15.0)
Total Lymphocyte: 13.1 %
WBC: 5.2 10*3/uL (ref 3.8–10.8)

## 2020-03-10 LAB — RETICULOCYTES
ABS Retic: 60480 cells/uL (ref 25000–9000)
Retic Ct Pct: 1.6 %

## 2020-03-10 LAB — COMPLETE METABOLIC PANEL WITH GFR
AG Ratio: 1.4 (calc) (ref 1.0–2.5)
ALT: 10 U/L (ref 9–46)
AST: 13 U/L (ref 10–35)
Albumin: 3.9 g/dL (ref 3.6–5.1)
Alkaline phosphatase (APISO): 82 U/L (ref 35–144)
BUN: 16 mg/dL (ref 7–25)
CO2: 27 mmol/L (ref 20–32)
Calcium: 9.7 mg/dL (ref 8.6–10.3)
Chloride: 102 mmol/L (ref 98–110)
Creat: 0.87 mg/dL (ref 0.70–1.18)
GFR, Est African American: 95 mL/min/{1.73_m2} (ref >=60)
GFR, Est Non African American: 82 mL/min/{1.73_m2} (ref >=60)
Globulin: 2.7 g/dL (calc) (ref 1.9–3.7)
Glucose, Bld: 104 mg/dL — ABNORMAL HIGH (ref 65–99)
Potassium: 5 mmol/L (ref 3.5–5.3)
Sodium: 138 mmol/L (ref 135–146)
Total Bilirubin: 0.6 mg/dL (ref 0.2–1.2)
Total Protein: 6.6 g/dL (ref 6.1–8.1)

## 2020-03-10 LAB — HEMOGLOBIN A1C
Hgb A1c MFr Bld: 7.2 % of total Hgb — ABNORMAL HIGH (ref <5.7)
Mean Plasma Glucose: 160 mg/dL
eAG (mmol/L): 8.9 mmol/L

## 2020-03-10 LAB — IRON,TIBC AND FERRITIN PANEL
%SAT: 23 % (calc) (ref 20–48)
Ferritin: 10 ng/mL — ABNORMAL LOW (ref 24–380)
Iron: 82 ug/dL (ref 50–180)
TIBC: 354 mcg/dL (calc) (ref 250–425)

## 2020-03-10 LAB — FOLATE: Folate: >24 ng/mL

## 2020-03-10 LAB — VITAMIN B12: Vitamin B-12: 1005 pg/mL (ref 200–1100)

## 2020-04-05 ENCOUNTER — Telehealth: Payer: Self-pay

## 2020-04-05 NOTE — Telephone Encounter (Signed)
Spoke with patient to remind of missed remote transmission 

## 2020-05-11 ENCOUNTER — Ambulatory Visit (INDEPENDENT_AMBULATORY_CARE_PROVIDER_SITE_OTHER): Payer: Medicare Other | Admitting: Family Medicine

## 2020-05-11 ENCOUNTER — Other Ambulatory Visit: Payer: Self-pay

## 2020-05-11 DIAGNOSIS — E538 Deficiency of other specified B group vitamins: Secondary | ICD-10-CM | POA: Diagnosis not present

## 2020-05-11 NOTE — Progress Notes (Signed)
Per lab note, pt does not need to continue with b12 shots

## 2020-05-14 ENCOUNTER — Encounter: Payer: Self-pay | Admitting: Nurse Practitioner

## 2020-05-14 ENCOUNTER — Ambulatory Visit (INDEPENDENT_AMBULATORY_CARE_PROVIDER_SITE_OTHER): Payer: Medicare Other | Admitting: Nurse Practitioner

## 2020-05-14 ENCOUNTER — Other Ambulatory Visit: Payer: Self-pay

## 2020-05-14 VITALS — BP 124/72 | HR 64 | Temp 98.4°F | Ht 70.0 in | Wt 174.2 lb

## 2020-05-14 DIAGNOSIS — R21 Rash and other nonspecific skin eruption: Secondary | ICD-10-CM | POA: Diagnosis not present

## 2020-05-14 MED ORDER — VALACYCLOVIR HCL 1 G PO TABS: 1000.0000 mg | ORAL_TABLET | Freq: Three times a day (TID) | ORAL | 0 refills | Status: DC

## 2020-05-14 NOTE — Progress Notes (Signed)
Subjective:    Patient ID: Charlene Cowdrey, male    DOB: 1941-01-29, 80 y.o.   MRN: [id number]  HPI: Alik Mawson is a 80 y.o. male presenting for rash.  Chief Complaint  Patient presents with  . Rash    Believes he has shingles on the left arm, rash is burning and itching for the past 5 days   RASH Duration:  days  Location: arm and down to fingers  Itching: yes Burning: yes Redness: yes Oozing: no Scaling: no Blisters: no Painful: yes Fevers: no Change in detergents/soaps/personal care products: no Recent illness: no Recent travel:no History of same: no Context: spreading Alleviating factors: nothing Treatments attempted: nothing tried Shortness of breath: no  Throat/tongue swelling: no Myalgias/arthralgias: no  Thinks it may be shingles and is worried about it being contagious.  No Known Allergies  Outpatient Encounter Medications as of 05/14/2020  Medication Sig  . aspirin 81 MG tablet Take 81 mg by mouth daily.  Marland Kitchen atorvastatin (LIPITOR) 40 MG tablet Take 1 tablet (40 mg total) by mouth daily.  Marland Kitchen buPROPion (WELLBUTRIN XL) 150 MG 24 hr tablet Take 1 tablet (150 mg total) by mouth daily.  Marland Kitchen donepezil (ARICEPT) 5 MG tablet TAKE (1) TABLET BY MOUTH AT BEDTIME.  . fluticasone (FLONASE) 50 MCG/ACT nasal spray Place 2 sprays into both nostrils daily.  . folic acid (FOLVITE) 1 MG tablet TAKE (1) TABLET BY MOUTH ONCE DAILY.  Marland Kitchen lisinopril (ZESTRIL) 2.5 MG tablet Take 1 tablet (2.5 mg total) by mouth daily.  . metFORMIN (GLUCOPHAGE) 500 MG tablet TAKE 1 TABLET BY MOUTH TWICE DAILY WITH A MEAL.  . metoprolol tartrate (LOPRESSOR) 50 MG tablet TAKE (1) TABLET BY MOUTH TWICE DAILY.  Marland Kitchen neomycin-polymyxin-hydrocortisone (CORTISPORIN) OTIC solution Place 4 drops into the right ear 4 (four) times daily.  Marland Kitchen omeprazole (PRILOSEC) 20 MG capsule Take 1 capsule (20 mg total) by mouth 2 (two) times daily.  Marland Kitchen terazosin (HYTRIN) 5 MG capsule TAKE 1 CAPSULE BY MOUTH EVERY EVENING.  .  valACYclovir (VALTREX) 1000 MG tablet Take 1 tablet (1,000 mg total) by mouth every 8 (eight) hours.  . [DISCONTINUED] venlafaxine XR (EFFEXOR-XR) 75 MG 24 hr capsule Take 2 capsules (150 mg total) by mouth daily.  . nitroGLYCERIN (NITROSTAT) 0.4 MG SL tablet Place 1 tablet (0.4 mg total) under the tongue every 5 (five) minutes x 3 doses as needed for chest pain. (Patient not taking: Reported on 05/14/2020)   No facility-administered encounter medications on file as of 05/14/2020.    Patient Active Problem List   Diagnosis Date Noted  . Dementia ([place])   . Depression   . Abdominal pain 05/01/2017  . GERD (gastroesophageal reflux disease) 04/02/2017  . PAF (paroxysmal atrial fibrillation) ([place]) 02/22/2017  . Chest pain, negative MI, meds adjusted 03/13/2016  . Normocytic anemia 01/17/2015  . Hepatocellular carcinoma ([place]) 11/04/2014  . Type II diabetes mellitus with neurological manifestations ([place]) 04/29/2013  . Tinnitus of both ears 04/29/2013  . CAD (coronary artery disease) of artery bypass graft 01/31/2013  . Osteoarthritis of right knee 01/31/2013  . Ulceration 01/31/2013  . Unspecified chronic bronchitis ([place]) 01/31/2013  . Hepatic cirrhosis due to chronic hepatitis C infection ([place]) 12/27/2012  . Encounter for long-term (current) use of NSAIDs 12/10/2012  . Right knee pain 11/25/2012  . BPH (benign prostatic hyperplasia) 11/25/2012  . Vitamin B12 deficiency 11/25/2012  . Tinea 10/21/2012  . HLD (hyperlipidemia) 10/07/2012  . HTN (hypertension) 10/07/2012  . CAD S/P percutaneous coronary  angioplasty 10/07/2012  . Diabetes mellitus ([place]) 10/07/2012  . Pacemaker 10/07/2012  . Folic acid deficiency 67/89/3810    Past Medical History:  Diagnosis Date  . Anemia, chronic disease 10/07/2012   SEP 2014 HB 13 MV >90 PLT 99   . Cataract   . Dementia ([place])   . Diabetes mellitus without complication ([place])   . H. pylori infection 01/31/2013  . 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 Hepatocellular carcinoma ([place]) 11/2014   2.2 cm, underwent microwave ablation at [facility] (F/U 02/2015), open microwave ablation and cholecyctectomy 7/19  . Hyperlipidemia   . Hypertension   . Liver cirrhosis ([place])   . Myocardial infarction [facility]) 2009   one stent  . 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 Interim medical history since our last visit reviewed.  Review of Systems Per HPI unless specifically indicated above     Objective:    BP 124/72   Pulse 64   Temp 98.4 F (36.9 C)   Ht 5\' 10"  (1.778 m)   Wt 174 lb 3.2 oz (79 kg)   SpO2 97%   BMI 25.00 kg/m   Wt Readings from Last 3 Encounters:  05/14/20 174 lb 3.2 oz (79 kg)  03/09/20 188 lb (85.3 kg)  04/08/19 163 lb (73.9 kg)    Physical Exam Vitals and nursing note reviewed.  Constitutional:      General: He is not in acute distress.    Appearance: Normal appearance. He is not toxic-appearing.  HENT:     Head: Normocephalic and atraumatic.  Skin:    General: Skin is warm and dry.     Capillary Refill: Capillary refill takes less than 2 seconds.     Coloration: Skin is pale. Skin is not jaundiced.     Findings: Rash present. No erythema.     Comments: Multiple erythematous lesions noted to left arm extending from tricep muscle sporadically to palm and third finger of left hand.  Some lesions appear flesh-colored and slightly vesicular, some lesions are scabbed over.  Neurological:     Mental Status: He is alert. Mental status is at baseline.  Psychiatric:        Mood and Affect: Mood normal.        Behavior: Behavior normal.        Thought Content: Thought content normal.        Judgment: Judgment normal.       Assessment & Plan:  1. Rash and nonspecific skin eruption Acute.  Question shingles given burning/electric sensation and rash  is on one side of the body only.  Reports receiving 1 dose of Shingles vaccine.  Will start on 7 day course of Valtrex 1000 mg every 8 hours.  Discussed that if shingles, contagious if vesicle open and start oozing.  Strongly encouraged making a follow up appointment for 1 month for f/u lab work with PCP.  - valACYclovir (VALTREX) 1000 MG tablet; Take 1 tablet (1,000 mg total) by mouth every 8 (eight) hours.  Dispense: 21 tablet; Refill: 0    Follow up plan: Return in about 4 weeks (around 06/11/2020) for f/u with pcp.

## 2020-05-21 ENCOUNTER — Other Ambulatory Visit: Payer: Self-pay | Admitting: Family Medicine

## 2020-05-22 ENCOUNTER — Telehealth: Payer: Self-pay | Admitting: Emergency Medicine

## 2020-05-22 ENCOUNTER — Ambulatory Visit (INDEPENDENT_AMBULATORY_CARE_PROVIDER_SITE_OTHER): Payer: Medicare Other

## 2020-05-22 DIAGNOSIS — I480 Paroxysmal atrial fibrillation: Secondary | ICD-10-CM

## 2020-05-22 LAB — CUP PACEART REMOTE DEVICE CHECK
Battery Impedance: 10431 Ohm
Battery Voltage: 2.59 V
Brady Statistic RV Percent Paced: 0 %
Date Time Interrogation Session: 20220419113915
Implantable Lead Implant Date: 20100520
Implantable Lead Implant Date: 20100520
Implantable Lead Location: 753859
Implantable Lead Location: 753860
Implantable Lead Model: 4092
Implantable Lead Model: 5076
Implantable Pulse Generator Implant Date: 20100520
Lead Channel Impedance Value: 1048 Ohm
Lead Channel Impedance Value: 67 Ohm
Lead Channel Setting Pacing Amplitude: 2.5 V
Lead Channel Setting Pacing Pulse Width: 1 ms
Lead Channel Setting Sensing Sensitivity: 2.8 mV

## 2020-05-22 NOTE — Telephone Encounter (Signed)
Patient contact # [phone] called using interpreter Verdis Frederickson ( [place] [phone]). Person answering from that # reports that it was a wrong #. Daughter Verdis Frederickson has same # listed. Pharmacy contacted to get contact # . Dr [name] aware of ERI reached 01/06/20 and can do gen change 05/23/20.  Patient contacted at [phone] and made aware of need for gen change and urgency . Smith RN transferred call to set up procedure with patient.

## 2020-05-23 ENCOUNTER — Ambulatory Visit (HOSPITAL_COMMUNITY)
Admission: RE | Admit: 2020-05-23 | Discharge: 2020-05-23 | Disposition: A | Discharge status: Home / Self Care | Payer: Medicare Other | Attending: Internal Medicine | Admitting: Internal Medicine

## 2020-05-23 ENCOUNTER — Other Ambulatory Visit: Payer: Self-pay

## 2020-05-23 ENCOUNTER — Encounter (HOSPITAL_COMMUNITY)
Admission: RE | Disposition: A | Discharge status: Home / Self Care | Payer: Self-pay | Source: Home / Self Care | Attending: Internal Medicine

## 2020-05-23 DIAGNOSIS — I10 Essential (primary) hypertension: Secondary | ICD-10-CM | POA: Insufficient documentation

## 2020-05-23 DIAGNOSIS — Z7982 Long term (current) use of aspirin: Secondary | ICD-10-CM | POA: Insufficient documentation

## 2020-05-23 DIAGNOSIS — Z87891 Personal history of nicotine dependence: Secondary | ICD-10-CM | POA: Insufficient documentation

## 2020-05-23 DIAGNOSIS — Z79899 Other long term (current) drug therapy: Secondary | ICD-10-CM | POA: Diagnosis not present

## 2020-05-23 DIAGNOSIS — Z45010 Encounter for checking and testing of cardiac pacemaker pulse generator [battery]: Secondary | ICD-10-CM

## 2020-05-23 DIAGNOSIS — I495 Sick sinus syndrome: Secondary | ICD-10-CM | POA: Diagnosis not present

## 2020-05-23 DIAGNOSIS — Z7984 Long term (current) use of oral hypoglycemic drugs: Secondary | ICD-10-CM | POA: Diagnosis not present

## 2020-05-23 DIAGNOSIS — Z20822 Contact with and (suspected) exposure to covid-19: Secondary | ICD-10-CM | POA: Insufficient documentation

## 2020-05-23 DIAGNOSIS — I252 Old myocardial infarction: Secondary | ICD-10-CM | POA: Diagnosis not present

## 2020-05-23 HISTORY — PX: PPM GENERATOR CHANGEOUT: EP1233

## 2020-05-23 LAB — CBC
HCT: 38.2 % — ABNORMAL LOW (ref 39.0–52.0)
Hemoglobin: 12.5 g/dL — ABNORMAL LOW (ref 13.0–17.0)
MCH: 28.6 pg (ref 26.0–34.0)
MCHC: 32.7 g/dL (ref 30.0–36.0)
MCV: 87.4 fL (ref 80.0–100.0)
Platelets: 121 10*3/uL — ABNORMAL LOW (ref 150–400)
RBC: 4.37 MIL/uL (ref 4.22–5.81)
RDW: 15.1 % (ref 11.5–15.5)
WBC: 5.2 10*3/uL (ref 4.0–10.5)
nRBC: 0 % (ref 0.0–0.2)

## 2020-05-23 LAB — BASIC METABOLIC PANEL
Anion gap: 6 (ref 5–15)
BUN: 13 mg/dL (ref 8–23)
CO2: 24 mmol/L (ref 22–32)
Calcium: 9 mg/dL (ref 8.9–10.3)
Chloride: 104 mmol/L (ref 98–111)
Creatinine, Ser: 0.78 mg/dL (ref 0.61–1.24)
GFR, Estimated: >60 mL/min (ref >60)
Glucose, Bld: 141 mg/dL — ABNORMAL HIGH (ref 70–99)
Potassium: 4.2 mmol/L (ref 3.5–5.1)
Sodium: 134 mmol/L — ABNORMAL LOW (ref 135–145)

## 2020-05-23 LAB — GLUCOSE, CAPILLARY: Glucose-Capillary: 130 mg/dL — ABNORMAL HIGH (ref 70–99)

## 2020-05-23 LAB — SARS CORONAVIRUS 2 BY RT PCR (HOSPITAL ORDER, PERFORMED IN ~~LOC~~ HOSPITAL LAB): SARS Coronavirus 2: NEGATIVE

## 2020-05-23 SURGERY — PPM GENERATOR CHANGEOUT

## 2020-05-23 MED ORDER — LIDOCAINE HCL (PF) 1 % IJ SOLN
INTRAMUSCULAR | Status: DC | PRN
Start: 2020-05-23 — End: 2020-05-23
  Administered 2020-05-23: 50 mL

## 2020-05-23 MED ORDER — POVIDONE-IODINE 10 % EX SWAB
2.0000 "application " | Freq: Once | CUTANEOUS | Status: AC
  Administered 2020-05-23: 2 via TOPICAL

## 2020-05-23 MED ORDER — LIDOCAINE HCL (PF) 1 % IJ SOLN
INTRAMUSCULAR | Status: AC
  Filled 2020-05-23: qty 90

## 2020-05-23 MED ORDER — SODIUM CHLORIDE 0.9 % IV SOLN
80.0000 mg | INTRAVENOUS | Status: AC
  Administered 2020-05-23: 80 mg

## 2020-05-23 MED ORDER — CEFAZOLIN SODIUM-DEXTROSE 2-4 GM/100ML-% IV SOLN
2.0000 g | INTRAVENOUS | Status: AC
  Administered 2020-05-23: 2 g via INTRAVENOUS

## 2020-05-23 MED ORDER — ACETAMINOPHEN 325 MG PO TABS: 325.0000 mg | ORAL_TABLET | ORAL | Status: DC | PRN

## 2020-05-23 MED ORDER — ONDANSETRON HCL 4 MG/2ML IJ SOLN: 4.0000 mg | Freq: Four times a day (QID) | INTRAMUSCULAR | Status: DC | PRN

## 2020-05-23 MED ORDER — SODIUM CHLORIDE 0.9 % IV SOLN
INTRAVENOUS | Status: AC
  Filled 2020-05-23: qty 2

## 2020-05-23 MED ORDER — CEFAZOLIN SODIUM-DEXTROSE 2-4 GM/100ML-% IV SOLN
INTRAVENOUS | Status: AC
  Filled 2020-05-23: qty 100

## 2020-05-23 MED ORDER — SODIUM CHLORIDE 0.9 % IV SOLN: INTRAVENOUS | Status: DC

## 2020-05-23 SURGICAL SUPPLY — 5 items
CABLE SURGICAL S-101-97-12 (CABLE) ×2 IMPLANT
IPG PACE AZUR XT DR MRI W1DR01 (Pacemaker) ×1 IMPLANT
PACE AZURE XT DR MRI W1DR01 (Pacemaker) ×2 IMPLANT
PAD PRO RADIOLUCENT 2001M-C (PAD) ×2 IMPLANT
TRAY PACEMAKER INSERTION (PACKS) ×2 IMPLANT

## 2020-05-23 NOTE — Telephone Encounter (Signed)
Spoke with Pt.  Pt's daughter is with him and they are headed to [facility] for pacemaker gen change.  Gave address to daughter.  Advised for pacemaker gen change time at hospital would be short, no overnight stay.  Per daughter, Pt has a rash on his left arm.  Pt has taken a 7 day course of antiviral medication.  Advised cath lab.  Advised cath lab Pt was on his way for procedure.  Precert message already sent.
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
## 2020-05-23 NOTE — Discharge Instructions (Signed)

## 2020-05-23 NOTE — Telephone Encounter (Signed)
Late entry:  Spoke with Pt 05/22/2020 at 4:45 pm.  Advised Pt needed to go to [facility] 05/23/20 for a generator change.  Pt stated he did not have transportation and would need to contact his daughter to see if she could drive him.  Offered Pt transportation via [facility] services but Pt refused.  Stated he wanted his daughter to take him.  Gave Pt surgery instructions:  Nothing to eat after midnight, he will take only his metoprolol day of surgery (Pt preference), and asked him to take a good shower and scrub his chest before coming to the hospital.  Advised Pt would call him 05/23/20 at 7:00 am to confirm he had transportation to hospital.

## 2020-05-24 ENCOUNTER — Encounter (HOSPITAL_COMMUNITY): Payer: Self-pay | Admitting: Internal Medicine

## 2020-05-24 ENCOUNTER — Other Ambulatory Visit: Payer: Self-pay | Admitting: Family Medicine

## 2020-05-24 ENCOUNTER — Other Ambulatory Visit: Payer: Self-pay | Admitting: *Deleted

## 2020-05-24 NOTE — Telephone Encounter (Signed)
Patient's daughter Joycelyn Schmid called to follow up on refill request for   metFORMIN (GLUCOPHAGE) 500 MG tablet [[id number]]   Pharmacy:   [facility] [address], [place] [address]  Phone:  [phone] Fax:  [phone]   Please advise Joycelyn Schmid at [phone] when refill called in. Patient took his last pill 3 days ago.

## 2020-06-07 ENCOUNTER — Ambulatory Visit (INDEPENDENT_AMBULATORY_CARE_PROVIDER_SITE_OTHER): Payer: Medicare Other | Admitting: Emergency Medicine

## 2020-06-07 ENCOUNTER — Other Ambulatory Visit: Payer: Self-pay

## 2020-06-07 DIAGNOSIS — I495 Sick sinus syndrome: Secondary | ICD-10-CM

## 2020-06-07 LAB — CUP PACEART INCLINIC DEVICE CHECK
Battery Remaining Longevity: 164 mo
Battery Voltage: 3.22 V
Brady Statistic AP VP Percent: 0.03 %
Brady Statistic AP VS Percent: 75.81 %
Brady Statistic AS VP Percent: 0.01 %
Brady Statistic AS VS Percent: 24.15 %
Brady Statistic RA Percent Paced: 72.13 %
Brady Statistic RV Percent Paced: 1.15 %
Date Time Interrogation Session: 20220505101419
Implantable Pulse Generator Implant Date: 20220420
Lead Channel Impedance Value: 323 Ohm
Lead Channel Impedance Value: 380 Ohm
Lead Channel Impedance Value: 437 Ohm
Lead Channel Impedance Value: 513 Ohm
Lead Channel Pacing Threshold Amplitude: 0.5 V
Lead Channel Pacing Threshold Amplitude: 1.5 V
Lead Channel Pacing Threshold Pulse Width: 0.4 ms
Lead Channel Pacing Threshold Pulse Width: 0.4 ms
Lead Channel Sensing Intrinsic Amplitude: 3.4 mV
Lead Channel Sensing Intrinsic Amplitude: 9.8 mV
Lead Channel Setting Pacing Amplitude: 1.75 V
Lead Channel Setting Pacing Amplitude: 3 V
Lead Channel Setting Pacing Pulse Width: 0.4 ms
Lead Channel Setting Sensing Sensitivity: 1.2 mV

## 2020-06-07 NOTE — Progress Notes (Signed)
Wound check appointment for generator change out. Steri-strips removed. Wound without redness or edema. Incision edges approximated, wound well healed. Normal device function. Thresholds, sensing, and impedances consistent with implant measurements. Device programmed at chronic output (no new leads implanted). Histogram distribution appropriate for patient and level of activity, appears flat. AT/AF burden 4.7%. 1 AFL event logged with duration of 17 hours. No OAC on file. No high ventricular rates noted. Patient educated about wound care and arm mobility. Home remote scheduled 07/04/20. ROV in 3 months with Dr. Saunders Glance.

## 2020-06-07 NOTE — Progress Notes (Signed)
Remote pacemaker transmission.   

## 2020-06-15 ENCOUNTER — Encounter: Payer: Self-pay | Admitting: Family Medicine

## 2020-06-15 ENCOUNTER — Ambulatory Visit (INDEPENDENT_AMBULATORY_CARE_PROVIDER_SITE_OTHER): Payer: Medicare Other | Admitting: Family Medicine

## 2020-06-15 ENCOUNTER — Other Ambulatory Visit: Payer: Self-pay

## 2020-06-15 ENCOUNTER — Other Ambulatory Visit: Payer: Self-pay | Admitting: Family Medicine

## 2020-06-15 VITALS — BP 112/64 | HR 68 | Temp 98.1°F | Resp 16 | Ht 70.0 in | Wt 171.0 lb

## 2020-06-15 DIAGNOSIS — E538 Deficiency of other specified B group vitamins: Secondary | ICD-10-CM | POA: Diagnosis not present

## 2020-06-15 DIAGNOSIS — F0150 Vascular dementia without behavioral disturbance: Secondary | ICD-10-CM | POA: Diagnosis not present

## 2020-06-15 DIAGNOSIS — E118 Type 2 diabetes mellitus with unspecified complications: Secondary | ICD-10-CM | POA: Diagnosis not present

## 2020-06-15 MED ORDER — VITAMIN B-12 1000 MCG PO TABS: 1000.0000 ug | ORAL_TABLET | Freq: Every day | ORAL | 3 refills | Status: DC
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
## 2020-06-18 ENCOUNTER — Encounter: Payer: Self-pay | Admitting: *Deleted

## 2020-06-18 LAB — COMPLETE METABOLIC PANEL WITH GFR
AG Ratio: 1.3 (calc) (ref 1.0–2.5)
ALT: 17 U/L (ref 9–46)
AST: 19 U/L (ref 10–35)
Albumin: 4.1 g/dL (ref 3.6–5.1)
Alkaline phosphatase (APISO): 91 U/L (ref 35–144)
BUN: 20 mg/dL (ref 7–25)
CO2: 28 mmol/L (ref 20–32)
Calcium: 10 mg/dL (ref 8.6–10.3)
Chloride: 99 mmol/L (ref 98–110)
Creat: 0.84 mg/dL (ref 0.70–1.11)
GFR, Est African American: 96 mL/min/{1.73_m2} (ref >=60)
GFR, Est Non African American: 83 mL/min/{1.73_m2} (ref >=60)
Globulin: 3.2 g/dL (calc) (ref 1.9–3.7)
Glucose, Bld: 112 mg/dL — ABNORMAL HIGH (ref 65–99)
Potassium: 5.2 mmol/L (ref 3.5–5.3)
Sodium: 134 mmol/L — ABNORMAL LOW (ref 135–146)
Total Bilirubin: 0.7 mg/dL (ref 0.2–1.2)
Total Protein: 7.3 g/dL (ref 6.1–8.1)

## 2020-06-18 LAB — CBC WITH DIFFERENTIAL/PLATELET
Absolute Monocytes: 418 cells/uL (ref 200–950)
Basophils Absolute: 52 cells/uL (ref 0–200)
Basophils Relative: 0.9 %
Eosinophils Absolute: 191 cells/uL (ref 15–500)
Eosinophils Relative: 3.3 %
HCT: 39.4 % (ref 38.5–50.0)
Hemoglobin: 12.6 g/dL — ABNORMAL LOW (ref 13.2–17.1)
Lymphs Abs: 1235 cells/uL (ref 850–3900)
MCH: 28.3 pg (ref 27.0–33.0)
MCHC: 32 g/dL (ref 32.0–36.0)
MCV: 88.3 fL (ref 80.0–100.0)
MPV: 12.4 fL (ref 7.5–12.5)
Monocytes Relative: 7.2 %
Neutro Abs: 3903 cells/uL (ref 1500–7800)
Neutrophils Relative %: 67.3 %
Platelets: 130 10*3/uL — ABNORMAL LOW (ref 140–400)
RBC: 4.46 10*6/uL (ref 4.20–5.80)
RDW: 15.4 % — ABNORMAL HIGH (ref 11.0–15.0)
Total Lymphocyte: 21.3 %
WBC: 5.8 10*3/uL (ref 3.8–10.8)

## 2020-06-18 LAB — LIPID PANEL
Cholesterol: 112 mg/dL (ref <200)
HDL: 45 mg/dL (ref >=40)
LDL Cholesterol (Calc): 48 mg/dL (calc)
Non-HDL Cholesterol (Calc): 67 mg/dL (calc) (ref <130)
Total CHOL/HDL Ratio: 2.5 (calc) (ref <5.0)
Triglycerides: 102 mg/dL (ref <150)

## 2020-06-18 LAB — HEMOGLOBIN A1C
Hgb A1c MFr Bld: 7 % of total Hgb — ABNORMAL HIGH (ref <5.7)
Mean Plasma Glucose: 154 mg/dL
eAG (mmol/L): 8.5 mmol/L

## 2020-06-18 LAB — MICROALBUMIN, URINE

## 2020-06-19 ENCOUNTER — Other Ambulatory Visit: Payer: Self-pay | Admitting: Family Medicine

## 2020-06-19 DIAGNOSIS — E118 Type 2 diabetes mellitus with unspecified complications: Secondary | ICD-10-CM

## 2020-06-19 DIAGNOSIS — I10 Essential (primary) hypertension: Secondary | ICD-10-CM

## 2020-06-19 DIAGNOSIS — I2510 Atherosclerotic heart disease of native coronary artery without angina pectoris: Secondary | ICD-10-CM

## 2020-06-19 LAB — MICROALBUMIN, URINE: Microalb, Ur: 1.9 mg/dL
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
## 2020-07-05 ENCOUNTER — Other Ambulatory Visit: Payer: Self-pay

## 2020-07-05 ENCOUNTER — Ambulatory Visit (INDEPENDENT_AMBULATORY_CARE_PROVIDER_SITE_OTHER): Payer: Medicare Other

## 2020-07-05 VITALS — BP 120/58 | Temp 98.1°F | Ht 70.0 in | Wt 171.5 lb

## 2020-07-05 DIAGNOSIS — Z0000 Encounter for general adult medical examination without abnormal findings: Secondary | ICD-10-CM

## 2020-07-05 NOTE — Patient Instructions (Signed)
[name] , Thank you for taking time to come for your Medicare Wellness Visit. I appreciate your ongoing commitment to your health goals. Please review the following plan we discussed and let me know if I can assist you in the future.   Screening recommendations/referrals: Colonoscopy: No longer required  Recommended yearly ophthalmology/optometry visit for glaucoma screening and checkup Recommended yearly dental visit for hygiene and checkup  Vaccinations: Influenza vaccine: Up to date, next due fall 2022  Pneumococcal vaccine: Completed series  Tdap vaccine: Currently due, you may await and injury to receive  Shingles vaccine: Currently due, if you would like to receive we recommend that you do so at your local pharmacy    Advanced directives: Please bring in a copy of your advanced medical directives so that we may scan into your chart  Conditions/risks identified: None   Next appointment: None   Preventive Care 65 Years and Older, Male Preventive care refers to lifestyle choices and visits with your health care provider that can promote health and wellness. What does preventive care include?  A yearly physical exam. This is also called an annual well check.  Dental exams once or twice a year.  Routine eye exams. Ask your health care provider how often you should have your eyes checked.  Personal lifestyle choices, including:  Daily care of your teeth and gums.  Regular physical activity.  Eating a healthy diet.  Avoiding tobacco and drug use.  Limiting alcohol use.  Practicing safe sex.  Taking low doses of aspirin every day.  Taking vitamin and mineral supplements as recommended by your health care provider. What happens during an annual well check? The services and screenings done by your health care provider during your annual well check will depend on your age, overall health, lifestyle risk factors, and family history of disease. Counseling  Your health  care provider may ask you questions about your:  Alcohol use.  Tobacco use.  Drug use.  Emotional well-being.  Home and relationship well-being.  Sexual activity.  Eating habits.  History of falls.  Memory and ability to understand (cognition).  Work and work Statistician. Screening  You may have the following tests or measurements:  Height, weight, and BMI.  Blood pressure.  Lipid and cholesterol levels. These may be checked every 5 years, or more frequently if you are over 66 years old.  Skin check.  Lung cancer screening. You may have this screening every year starting at age 68 if you have a 30-pack-year history of smoking and currently smoke or have quit within the past 15 years.  Fecal occult blood test (FOBT) of the stool. You may have this test every year starting at age 11.  Flexible sigmoidoscopy or colonoscopy. You may have a sigmoidoscopy every 5 years or a colonoscopy every 10 years starting at age 41.  Prostate cancer screening. Recommendations will vary depending on your family history and other risks.  Hepatitis C blood test.  Hepatitis B blood test.  Sexually transmitted disease (STD) testing.  Diabetes screening. This is done by checking your blood sugar (glucose) after you have not eaten for a while (fasting). You may have this done every 1-3 years.  Abdominal aortic aneurysm (AAA) screening. You may need this if you are a current or former smoker.  Osteoporosis. You may be screened starting at age 43 if you are at high risk. Talk with your health care provider about your test results, treatment options, and if necessary, the need for more  tests. Vaccines  Your health care provider may recommend certain vaccines, such as:  Influenza vaccine. This is recommended every year.  Tetanus, diphtheria, and acellular pertussis (Tdap, Td) vaccine. You may need a Td booster every 10 years.  Zoster vaccine. You may need this after age  53.  Pneumococcal 13-valent conjugate (PCV13) vaccine. One dose is recommended after age 67.  Pneumococcal polysaccharide (PPSV23) vaccine. One dose is recommended after age 11. Talk to your health care provider about which screenings and vaccines you need and how often you need them. This information is not intended to replace advice given to you by your health care provider. Make sure you discuss any questions you have with your health care provider. Document Released: 02/16/2015 Document Revised: 10/10/2015 Document Reviewed: 11/21/2014 Elsevier Interactive Patient Education  2017 [employer] Prevention in the Home Falls can cause injuries. They can happen to people of all ages. There are many things you can do to make your home safe and to help prevent falls. What can I do on the outside of my home?  Regularly fix the edges of walkways and driveways and fix any cracks.  Remove anything that might make you trip as you walk through a door, such as a raised step or threshold.  Trim any bushes or trees on the path to your home.  Use bright outdoor lighting.  Clear any walking paths of anything that might make someone trip, such as rocks or tools.  Regularly check to see if handrails are loose or broken. Make sure that both sides of any steps have handrails.  Any raised decks and porches should have guardrails on the edges.  Have any leaves, snow, or ice cleared regularly.  Use sand or salt on walking paths during winter.  Clean up any spills in your garage right away. This includes oil or grease spills. What can I do in the bathroom?  Use night lights.  Install grab bars by the toilet and in the tub and shower. Do not use towel bars as grab bars.  Use non-skid mats or decals in the tub or shower.  If you need to sit down in the shower, use a plastic, non-slip stool.  Keep the floor dry. Clean up any water that spills on the floor as soon as it happens.  Remove  soap buildup in the tub or shower regularly.  Attach bath mats securely with double-sided non-slip rug tape.  Do not have throw rugs and other things on the floor that can make you trip. What can I do in the bedroom?  Use night lights.  Make sure that you have a light by your bed that is easy to reach.  Do not use any sheets or blankets that are too big for your bed. They should not hang down onto the floor.  Have a firm chair that has side arms. You can use this for support while you get dressed.  Do not have throw rugs and other things on the floor that can make you trip. What can I do in the kitchen?  Clean up any spills right away.  Avoid walking on wet floors.  Keep items that you use a lot in easy-to-reach places.  If you need to reach something above you, use a strong step stool that has a grab bar.  Keep electrical cords out of the way.  Do not use floor polish or wax that makes floors slippery. If you must use wax, use non-skid floor  wax.  Do not have throw rugs and other things on the floor that can make you trip. What can I do with my stairs?  Do not leave any items on the stairs.  Make sure that there are handrails on both sides of the stairs and use them. Fix handrails that are broken or loose. Make sure that handrails are as long as the stairways.  Check any carpeting to make sure that it is firmly attached to the stairs. Fix any carpet that is loose or worn.  Avoid having throw rugs at the top or bottom of the stairs. If you do have throw rugs, attach them to the floor with carpet tape.  Make sure that you have a light switch at the top of the stairs and the bottom of the stairs. If you do not have them, ask someone to add them for you. What else can I do to help prevent falls?  Wear shoes that:  Do not have high heels.  Have rubber bottoms.  Are comfortable and fit you well.  Are closed at the toe. Do not wear sandals.  If you use a  stepladder:  Make sure that it is fully opened. Do not climb a closed stepladder.  Make sure that both sides of the stepladder are locked into place.  Ask someone to hold it for you, if possible.  Clearly mark and make sure that you can see:  Any grab bars or handrails.  First and last steps.  Where the edge of each step is.  Use tools that help you move around (mobility aids) if they are needed. These include:  Canes.  Walkers.  Scooters.  Crutches.  Turn on the lights when you go into a dark area. Replace any light bulbs as soon as they burn out.  Set up your furniture so you have a clear path. Avoid moving your furniture around.  If any of your floors are uneven, fix them.  If there are any pets around you, be aware of where they are.  Review your medicines with your doctor. Some medicines can make you feel dizzy. This can increase your chance of falling. Ask your doctor what other things that you can do to help prevent falls. This information is not intended to replace advice given to you by your health care provider. Make sure you discuss any questions you have with your health care provider. Document Released: 11/16/2008 Document Revised: 06/28/2015 Document Reviewed: 02/24/2014 Elsevier Interactive Patient Education  2017 [employer].

## 2020-07-12 DIAGNOSIS — C220 Liver cell carcinoma: Secondary | ICD-10-CM | POA: Diagnosis not present

## 2020-07-12 DIAGNOSIS — Z950 Presence of cardiac pacemaker: Secondary | ICD-10-CM | POA: Diagnosis not present

## 2020-07-16 DIAGNOSIS — C220 Liver cell carcinoma: Secondary | ICD-10-CM | POA: Diagnosis not present

## 2020-07-18 ENCOUNTER — Other Ambulatory Visit: Payer: Self-pay | Admitting: Family Medicine

## 2020-07-18 DIAGNOSIS — I10 Essential (primary) hypertension: Secondary | ICD-10-CM

## 2020-07-18 DIAGNOSIS — I2510 Atherosclerotic heart disease of native coronary artery without angina pectoris: Secondary | ICD-10-CM

## 2020-07-18 DIAGNOSIS — E118 Type 2 diabetes mellitus with unspecified complications: Secondary | ICD-10-CM

## 2020-08-17 ENCOUNTER — Other Ambulatory Visit: Payer: Self-pay | Admitting: Family Medicine

## 2020-09-11 ENCOUNTER — Ambulatory Visit (INDEPENDENT_AMBULATORY_CARE_PROVIDER_SITE_OTHER): Payer: Medicare Other | Admitting: Internal Medicine

## 2020-09-11 ENCOUNTER — Other Ambulatory Visit: Payer: Self-pay

## 2020-09-11 ENCOUNTER — Encounter: Payer: Self-pay | Admitting: Internal Medicine

## 2020-09-11 VITALS — BP 98/58 | HR 60 | Ht 70.0 in | Wt 179.6 lb

## 2020-09-11 DIAGNOSIS — I2510 Atherosclerotic heart disease of native coronary artery without angina pectoris: Secondary | ICD-10-CM

## 2020-09-11 DIAGNOSIS — I495 Sick sinus syndrome: Secondary | ICD-10-CM | POA: Diagnosis not present

## 2020-09-11 NOTE — Patient Instructions (Signed)
Medication Instructions:  Your physician recommends that you continue on your current medications as directed. Please refer to the Current Medication list given to you today.  *If you need a refill on your cardiac medications before your next appointment, please call your pharmacy*   Lab Work: NONE   If you have labs (blood work) drawn today and your tests are completely normal, you will receive your results only by: . MyChart Message (if you have MyChart) OR . A paper copy in the mail If you have any lab test that is abnormal or we need to change your treatment, we will call you to review the results.   Testing/Procedures: NONE    Follow-Up: At [facility], you and your health needs are our priority.  As part of our continuing mission to provide you with exceptional heart care, we have created designated Provider Care Teams.  These Care Teams include your primary Cardiologist (physician) and Advanced Practice Providers (APPs -  Physician Assistants and Nurse Practitioners) who all work together to provide you with the care you need, when you need it.  We recommend signing up for the patient portal called "MyChart".  Sign up information is provided on this After Visit Summary.  MyChart is used to connect with patients for Virtual Visits (Telemedicine).  Patients are able to view lab/test results, encounter notes, upcoming appointments, etc.  Non-urgent messages can be sent to your provider as well.   To learn more about what you can do with MyChart, go to [email or web address].    Your next appointment:   1 year(s)  The format for your next appointment:   In Person  Provider:   Gregg Taylor, MD   Other Instructions Thank you for choosing [facility]!    
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
## 2020-09-21 ENCOUNTER — Other Ambulatory Visit: Payer: Self-pay | Admitting: Family Medicine

## 2020-11-21 ENCOUNTER — Telehealth: Payer: Self-pay | Admitting: *Deleted

## 2020-11-21 NOTE — Chronic Care Management (AMB) (Signed)
  Chronic Care Management   Note  11/21/2020 Name: [name] [name] MRN: [id number] DOB: 1940/04/20  [name] [name] is a 80 y.o. year old male who is a primary care patient of Dennard Schaumann, Cammie Mcgee, MD. I reached out to Raymondo Band by phone today in response to a referral sent by [name] [name] PCP.  [name] [name] was given information about Chronic Care Management services today including:  CCM service includes personalized support from designated clinical staff supervised by his physician, including individualized plan of care and coordination with other care providers 24/7 contact phone numbers for assistance for urgent and routine care needs. Service will only be billed when office clinical staff spend 20 minutes or more in a month to coordinate care. Only one practitioner may furnish and bill the service in a calendar month. The patient may stop CCM services at any time (effective at the end of the month) by phone call to the office staff. The patient is responsible for co-pay (up to 20% after annual deductible is met) if co-pay is required by the individual health plan.   Daughter [name] [name],[name] [name] DPR on file verbally agreed to assistance and services provided by embedded care coordination/care management team today.  Follow up plan: Telephone appointment with care management team member scheduled for:12/07/20  [place] [facility]  Direct Dial: [phone]

## 2020-12-06 ENCOUNTER — Telehealth: Payer: Medicare Other

## 2020-12-07 ENCOUNTER — Telehealth: Payer: Medicare Other

## 2020-12-18 ENCOUNTER — Other Ambulatory Visit: Payer: Self-pay | Admitting: Family Medicine

## 2020-12-18 DIAGNOSIS — I10 Essential (primary) hypertension: Secondary | ICD-10-CM

## 2020-12-18 DIAGNOSIS — I2510 Atherosclerotic heart disease of native coronary artery without angina pectoris: Secondary | ICD-10-CM

## 2020-12-18 DIAGNOSIS — E118 Type 2 diabetes mellitus with unspecified complications: Secondary | ICD-10-CM

## 2021-01-16 ENCOUNTER — Ambulatory Visit (INDEPENDENT_AMBULATORY_CARE_PROVIDER_SITE_OTHER): Payer: Medicare Other

## 2021-01-16 ENCOUNTER — Other Ambulatory Visit: Payer: Self-pay

## 2021-01-16 DIAGNOSIS — Z23 Encounter for immunization: Secondary | ICD-10-CM | POA: Diagnosis not present

## 2021-01-18 ENCOUNTER — Other Ambulatory Visit: Payer: Self-pay | Admitting: Family Medicine

## 2021-01-18 ENCOUNTER — Other Ambulatory Visit: Payer: Self-pay

## 2021-02-27 ENCOUNTER — Other Ambulatory Visit: Payer: Self-pay | Admitting: Family Medicine

## 2021-03-25 ENCOUNTER — Other Ambulatory Visit: Payer: Self-pay | Admitting: Family Medicine

## 2021-03-25 DIAGNOSIS — I2510 Atherosclerotic heart disease of native coronary artery without angina pectoris: Secondary | ICD-10-CM

## 2021-03-25 DIAGNOSIS — I10 Essential (primary) hypertension: Secondary | ICD-10-CM

## 2021-03-25 DIAGNOSIS — E118 Type 2 diabetes mellitus with unspecified complications: Secondary | ICD-10-CM

## 2021-05-20 ENCOUNTER — Other Ambulatory Visit: Payer: Self-pay | Admitting: Family Medicine

## 2021-06-28 ENCOUNTER — Ambulatory Visit (INDEPENDENT_AMBULATORY_CARE_PROVIDER_SITE_OTHER): Payer: Medicare Other

## 2021-06-28 DIAGNOSIS — I495 Sick sinus syndrome: Secondary | ICD-10-CM

## 2021-07-01 LAB — CUP PACEART REMOTE DEVICE CHECK
Battery Remaining Longevity: 154 mo
Battery Voltage: 3.07 V
Brady Statistic AP VP Percent: 0.03 %
Brady Statistic AP VS Percent: 73.01 %
Brady Statistic AS VP Percent: 0.03 %
Brady Statistic AS VS Percent: 26.92 %
Brady Statistic RA Percent Paced: 73.06 %
Brady Statistic RV Percent Paced: 0.07 %
Date Time Interrogation Session: 20230525135928
Implantable Pulse Generator Implant Date: 20220420
Lead Channel Impedance Value: 323 Ohm
Lead Channel Impedance Value: 361 Ohm
Lead Channel Impedance Value: 475 Ohm
Lead Channel Impedance Value: 532 Ohm
Lead Channel Pacing Threshold Amplitude: 0.375 V
Lead Channel Pacing Threshold Amplitude: 1.5 V
Lead Channel Pacing Threshold Pulse Width: 0.4 ms
Lead Channel Pacing Threshold Pulse Width: 0.4 ms
Lead Channel Sensing Intrinsic Amplitude: 2.5 mV
Lead Channel Sensing Intrinsic Amplitude: 2.5 mV
Lead Channel Sensing Intrinsic Amplitude: 8.25 mV
Lead Channel Sensing Intrinsic Amplitude: 8.25 mV
Lead Channel Setting Pacing Amplitude: 1.5 V
Lead Channel Setting Pacing Amplitude: 3 V
Lead Channel Setting Pacing Pulse Width: 0.4 ms
Lead Channel Setting Sensing Sensitivity: 1.2 mV

## 2021-07-08 NOTE — Progress Notes (Signed)
Remote pacemaker transmission.   

## 2021-07-30 ENCOUNTER — Ambulatory Visit: Payer: Self-pay | Admitting: *Deleted

## 2021-07-30 ENCOUNTER — Ambulatory Visit: Payer: Self-pay

## 2021-07-30 NOTE — Telephone Encounter (Signed)
Addressed in triage encounter, direct call from practice

## 2021-08-13 ENCOUNTER — Ambulatory Visit (INDEPENDENT_AMBULATORY_CARE_PROVIDER_SITE_OTHER): Payer: Medicare Other | Admitting: Family Medicine

## 2021-08-13 VITALS — BP 128/68 | HR 59 | Temp 98.5°F | Ht 70.0 in | Wt 183.0 lb

## 2021-08-13 DIAGNOSIS — E118 Type 2 diabetes mellitus with unspecified complications: Secondary | ICD-10-CM

## 2021-08-13 DIAGNOSIS — M7989 Other specified soft tissue disorders: Secondary | ICD-10-CM | POA: Diagnosis not present

## 2021-08-13 DIAGNOSIS — R0600 Dyspnea, unspecified: Secondary | ICD-10-CM

## 2021-08-13 DIAGNOSIS — I2510 Atherosclerotic heart disease of native coronary artery without angina pectoris: Secondary | ICD-10-CM | POA: Diagnosis not present

## 2021-08-13 MED ORDER — FUROSEMIDE 40 MG PO TABS: 40.0000 mg | ORAL_TABLET | Freq: Every day | ORAL | 3 refills | Status: DC | PRN
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
## 2021-08-14 LAB — COMPLETE METABOLIC PANEL WITH GFR
AG Ratio: 1.5 (calc) (ref 1.0–2.5)
ALT: 11 U/L (ref 9–46)
AST: 13 U/L (ref 10–35)
Albumin: 4.2 g/dL (ref 3.6–5.1)
Alkaline phosphatase (APISO): 88 U/L (ref 35–144)
BUN: 16 mg/dL (ref 7–25)
CO2: 23 mmol/L (ref 20–32)
Calcium: 9.2 mg/dL (ref 8.6–10.3)
Chloride: 101 mmol/L (ref 98–110)
Creat: 0.89 mg/dL (ref 0.70–1.22)
Globulin: 2.8 g/dL (calc) (ref 1.9–3.7)
Glucose, Bld: 175 mg/dL — ABNORMAL HIGH (ref 65–99)
Potassium: 4.6 mmol/L (ref 3.5–5.3)
Sodium: 135 mmol/L (ref 135–146)
Total Bilirubin: 0.6 mg/dL (ref 0.2–1.2)
Total Protein: 7 g/dL (ref 6.1–8.1)
eGFR: 86 mL/min/{1.73_m2} (ref >=60)

## 2021-08-14 LAB — CBC WITH DIFFERENTIAL/PLATELET
Absolute Monocytes: 437 cells/uL (ref 200–950)
Basophils Absolute: 47 cells/uL (ref 0–200)
Basophils Relative: 0.8 %
Eosinophils Absolute: 159 cells/uL (ref 15–500)
Eosinophils Relative: 2.7 %
HCT: 38 % — ABNORMAL LOW (ref 38.5–50.0)
Hemoglobin: 12.7 g/dL — ABNORMAL LOW (ref 13.2–17.1)
Lymphs Abs: 1086 cells/uL (ref 850–3900)
MCH: 30.3 pg (ref 27.0–33.0)
MCHC: 33.4 g/dL (ref 32.0–36.0)
MCV: 90.7 fL (ref 80.0–100.0)
MPV: 13.4 fL — ABNORMAL HIGH (ref 7.5–12.5)
Monocytes Relative: 7.4 %
Neutro Abs: 4171 cells/uL (ref 1500–7800)
Neutrophils Relative %: 70.7 %
Platelets: 111 10*3/uL — ABNORMAL LOW (ref 140–400)
RBC: 4.19 10*6/uL — ABNORMAL LOW (ref 4.20–5.80)
RDW: 12.8 % (ref 11.0–15.0)
Total Lymphocyte: 18.4 %
WBC: 5.9 10*3/uL (ref 3.8–10.8)

## 2021-08-14 LAB — HEMOGLOBIN A1C
Hgb A1c MFr Bld: 8.9 % of total Hgb — ABNORMAL HIGH (ref <5.7)
Mean Plasma Glucose: 209 mg/dL
eAG (mmol/L): 11.6 mmol/L

## 2021-08-14 LAB — BRAIN NATRIURETIC PEPTIDE: Brain Natriuretic Peptide: 67 pg/mL (ref <100)

## 2021-08-15 ENCOUNTER — Other Ambulatory Visit: Payer: Self-pay | Admitting: Family Medicine

## 2021-08-15 NOTE — Telephone Encounter (Signed)
Pt scheduled for a yearly physical.  A 30 day supply of his medications was given.

## 2021-08-16 ENCOUNTER — Other Ambulatory Visit: Payer: Self-pay

## 2021-08-16 DIAGNOSIS — E0800 Diabetes mellitus due to underlying condition with hyperosmolarity without nonketotic hyperglycemic-hyperosmolar coma (NKHHC): Secondary | ICD-10-CM

## 2021-08-16 MED ORDER — SITAGLIPTIN PHOSPHATE 100 MG PO TABS: 100.0000 mg | ORAL_TABLET | Freq: Every day | ORAL | 1 refills | Status: DC

## 2021-09-09 ENCOUNTER — Ambulatory Visit (INDEPENDENT_AMBULATORY_CARE_PROVIDER_SITE_OTHER): Payer: Medicare Other | Admitting: Family Medicine

## 2021-09-09 VITALS — BP 120/56 | HR 63 | Temp 98.2°F | Ht 70.0 in | Wt 181.2 lb

## 2021-09-09 DIAGNOSIS — F0150 Vascular dementia without behavioral disturbance: Secondary | ICD-10-CM | POA: Diagnosis not present

## 2021-09-09 DIAGNOSIS — E0800 Diabetes mellitus due to underlying condition with hyperosmolarity without nonketotic hyperglycemic-hyperosmolar coma (NKHHC): Secondary | ICD-10-CM

## 2021-09-09 DIAGNOSIS — Z0000 Encounter for general adult medical examination without abnormal findings: Secondary | ICD-10-CM | POA: Diagnosis not present

## 2021-09-09 DIAGNOSIS — M7989 Other specified soft tissue disorders: Secondary | ICD-10-CM | POA: Diagnosis not present

## 2021-09-09 DIAGNOSIS — I480 Paroxysmal atrial fibrillation: Secondary | ICD-10-CM | POA: Diagnosis not present

## 2021-09-09 MED ORDER — METFORMIN HCL 1000 MG PO TABS: 1000.0000 mg | ORAL_TABLET | Freq: Two times a day (BID) | ORAL | 3 refills | Status: DC
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
## 2021-09-10 ENCOUNTER — Other Ambulatory Visit: Payer: Self-pay

## 2021-09-10 DIAGNOSIS — E0800 Diabetes mellitus due to underlying condition with hyperosmolarity without nonketotic hyperglycemic-hyperosmolar coma (NKHHC): Secondary | ICD-10-CM

## 2021-09-10 DIAGNOSIS — I2510 Atherosclerotic heart disease of native coronary artery without angina pectoris: Secondary | ICD-10-CM

## 2021-09-10 DIAGNOSIS — I10 Essential (primary) hypertension: Secondary | ICD-10-CM

## 2021-09-10 MED ORDER — ATORVASTATIN CALCIUM 40 MG PO TABS: 40.0000 mg | ORAL_TABLET | Freq: Every day | ORAL | 3 refills | Status: DC

## 2021-09-10 MED ORDER — METFORMIN HCL 1000 MG PO TABS: 1000.0000 mg | ORAL_TABLET | Freq: Two times a day (BID) | ORAL | 3 refills | Status: DC

## 2021-09-10 MED ORDER — LISINOPRIL 2.5 MG PO TABS: 2.5000 mg | ORAL_TABLET | Freq: Every day | ORAL | 3 refills | Status: DC

## 2021-09-18 ENCOUNTER — Other Ambulatory Visit: Payer: Self-pay | Admitting: Family Medicine

## 2021-09-18 DIAGNOSIS — E0800 Diabetes mellitus due to underlying condition with hyperosmolarity without nonketotic hyperglycemic-hyperosmolar coma (NKHHC): Secondary | ICD-10-CM

## 2021-09-19 ENCOUNTER — Other Ambulatory Visit: Payer: Self-pay | Admitting: Family Medicine

## 2021-09-19 DIAGNOSIS — C220 Liver cell carcinoma: Secondary | ICD-10-CM | POA: Diagnosis not present

## 2021-09-19 DIAGNOSIS — Z950 Presence of cardiac pacemaker: Secondary | ICD-10-CM | POA: Diagnosis not present

## 2021-09-19 NOTE — Telephone Encounter (Signed)
Requested Prescriptions  Pending Prescriptions Disp Refills  . JANUVIA 100 MG tablet [Pharmacy Med Name: JANUVIA 100 MG TABLET] 30 tablet 0    Sig: TAKE ONE TABLET BY MOUTH ONCE DAILY.     Endocrinology:  Diabetes - DPP-4 Inhibitors Failed - 09/18/2021  4:11 PM      Failed - HBA1C is between 0 and 7.9 and within 180 days    Hgb A1c MFr Bld  Date Value Ref Range Status  08/13/2021 8.9 (H) <5.7 % of total Hgb Final    Comment:    For someone without known diabetes, a hemoglobin A1c value of 6.5% or greater indicates that they may have  diabetes and this should be confirmed with a follow-up  test. . For someone with known diabetes, a value <7% indicates  that their diabetes is well controlled and a value  greater than or equal to 7% indicates suboptimal  control. A1c targets should be individualized based on  duration of diabetes, age, comorbid conditions, and  other considerations. . Currently, no consensus exists regarding use of hemoglobin A1c for diagnosis of diabetes for children. .          Failed - Valid encounter within last 6 months    Recent Outpatient Visits          1 year ago Controlled type 2 diabetes mellitus with complication, without long-term current use of insulin ([place])   [place] Pickard, Cammie Mcgee, MD   1 year ago Rash and nonspecific skin eruption   [employer] Eulogio Bear, NP   1 year ago Vitamin B12 deficiency   [place] Pickard, Cammie Mcgee, MD   1 year ago Anemia, unspecified type   [place] [name], MD   2 years ago Weight loss, unintentional   [place], Cammie Mcgee, MD             Passed - Cr in normal range and within 360 days    Creat  Date Value Ref Range Status  08/13/2021 0.89 0.70 - 1.22 mg/dL Final

## 2021-09-23 DIAGNOSIS — C220 Liver cell carcinoma: Secondary | ICD-10-CM | POA: Diagnosis not present

## 2021-09-27 ENCOUNTER — Ambulatory Visit (INDEPENDENT_AMBULATORY_CARE_PROVIDER_SITE_OTHER): Payer: Medicare Other

## 2021-09-27 DIAGNOSIS — I495 Sick sinus syndrome: Secondary | ICD-10-CM | POA: Diagnosis not present

## 2021-10-08 ENCOUNTER — Encounter: Payer: Self-pay | Admitting: Internal Medicine

## 2021-10-08 ENCOUNTER — Ambulatory Visit: Payer: Medicare Other | Attending: Internal Medicine | Admitting: Internal Medicine

## 2021-10-08 VITALS — BP 110/60 | HR 61 | Ht 70.0 in | Wt 183.4 lb

## 2021-10-08 DIAGNOSIS — I2510 Atherosclerotic heart disease of native coronary artery without angina pectoris: Secondary | ICD-10-CM

## 2021-10-08 DIAGNOSIS — I495 Sick sinus syndrome: Secondary | ICD-10-CM | POA: Diagnosis not present

## 2021-10-08 LAB — CUP PACEART REMOTE DEVICE CHECK
Battery Remaining Longevity: 152 mo
Battery Voltage: 3.05 V
Brady Statistic AP VP Percent: 0.03 %
Brady Statistic AP VS Percent: 63.19 %
Brady Statistic AS VP Percent: 0.05 %
Brady Statistic AS VS Percent: 36.72 %
Brady Statistic RA Percent Paced: 63.26 %
Brady Statistic RV Percent Paced: 0.08 %
Date Time Interrogation Session: 20230831103802
Implantable Pulse Generator Implant Date: 20220420
Lead Channel Impedance Value: 342 Ohm
Lead Channel Impedance Value: 380 Ohm
Lead Channel Impedance Value: 513 Ohm
Lead Channel Impedance Value: 589 Ohm
Lead Channel Pacing Threshold Amplitude: 0.5 V
Lead Channel Pacing Threshold Amplitude: 1.375 V
Lead Channel Pacing Threshold Pulse Width: 0.4 ms
Lead Channel Pacing Threshold Pulse Width: 0.4 ms
Lead Channel Sensing Intrinsic Amplitude: 2.625 mV
Lead Channel Sensing Intrinsic Amplitude: 2.625 mV
Lead Channel Sensing Intrinsic Amplitude: 7.625 mV
Lead Channel Sensing Intrinsic Amplitude: 7.625 mV
Lead Channel Setting Pacing Amplitude: 1.5 V
Lead Channel Setting Pacing Amplitude: 2.75 V
Lead Channel Setting Pacing Pulse Width: 0.4 ms
Lead Channel Setting Sensing Sensitivity: 1.2 mV

## 2021-10-08 NOTE — Patient Instructions (Signed)
Medication Instructions:  Your physician recommends that you continue on your current medications as directed. Please refer to the Current Medication list given to you today.  *If you need a refill on your cardiac medications before your next appointment, please call your pharmacy*   Lab Work: NONE   If you have labs (blood work) drawn today and your tests are completely normal, you will receive your results only by: MyChart Message (if you have MyChart) OR A paper copy in the mail If you have any lab test that is abnormal or we need to change your treatment, we will call you to review the results.   Testing/Procedures: NONE    Follow-Up: At [facility], you and your health needs are our priority.  As part of our continuing mission to provide you with exceptional heart care, we have created designated Provider Care Teams.  These Care Teams include your primary Cardiologist (physician) and Advanced Practice Providers (APPs -  Physician Assistants and Nurse Practitioners) who all work together to provide you with the care you need, when you need it.  We recommend signing up for the patient portal called "MyChart".  Sign up information is provided on this After Visit Summary.  MyChart is used to connect with patients for Virtual Visits (Telemedicine).  Patients are able to view lab/test results, encounter notes, upcoming appointments, etc.  Non-urgent messages can be sent to your provider as well.   To learn more about what you can do with MyChart, go to [email or web address].    Your next appointment:   1 year(s)  The format for your next appointment:   In Person  Provider:   Gregg Taylor, MD    Other Instructions Thank you for choosing [facility]!    Important Information About Sugar       
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
## 2021-10-15 ENCOUNTER — Other Ambulatory Visit: Payer: Self-pay

## 2021-10-15 DIAGNOSIS — E0800 Diabetes mellitus due to underlying condition with hyperosmolarity without nonketotic hyperglycemic-hyperosmolar coma (NKHHC): Secondary | ICD-10-CM

## 2021-10-15 MED ORDER — METFORMIN HCL 1000 MG PO TABS: 1000.0000 mg | ORAL_TABLET | Freq: Two times a day (BID) | ORAL | 3 refills | Status: DC

## 2021-10-15 MED ORDER — SITAGLIPTIN PHOSPHATE 100 MG PO TABS: 100.0000 mg | ORAL_TABLET | Freq: Every day | ORAL | 3 refills | Status: DC

## 2021-10-21 ENCOUNTER — Other Ambulatory Visit: Payer: Self-pay | Admitting: Family Medicine

## 2021-10-21 NOTE — Progress Notes (Signed)
Remote pacemaker transmission.   

## 2021-11-20 ENCOUNTER — Other Ambulatory Visit: Payer: Self-pay | Admitting: Family Medicine

## 2021-11-20 NOTE — Telephone Encounter (Signed)
Last OV 09/09/21, will refill medication.  Requested Prescriptions  Pending Prescriptions Disp Refills  . terazosin (HYTRIN) 5 MG capsule [Pharmacy Med Name: TERAZOSIN '5MG'$  CAP] 90 capsule 0    Sig: TAKE 1 CAPSULE BY MOUTH EVERY EVENING.     Cardiovascular:  Alpha Blockers Failed - 11/20/2021 11:45 AM      Failed - Valid encounter within last 6 months    Recent Outpatient Visits          1 year ago Controlled type 2 diabetes mellitus with complication, without long-term current use of insulin ([place])   [place] Pickard, Cammie Mcgee, MD   1 year ago Rash and nonspecific skin eruption   [place] Eulogio Bear, NP   1 year ago Vitamin B12 deficiency   [place] Pickard, Cammie Mcgee, MD   1 year ago Anemia, unspecified type   [place] [name], MD   2 years ago Weight loss, unintentional   [place], Cammie Mcgee, MD             Passed - Last BP in normal range    BP Readings from Last 1 Encounters:  10/08/21 110/60         . folic acid (FOLVITE) 1 MG tablet [Pharmacy Med Name: FOLIC ACID 1 MG TABLETS] 30 tablet 0    Sig: TAKE (1) TABLET BY MOUTH ONCE DAILY.     Endocrinology:  Vitamins Failed - 11/20/2021 11:45 AM      Failed - Valid encounter within last 12 months    Recent Outpatient Visits          1 year ago Controlled type 2 diabetes mellitus with complication, without long-term current use of insulin ([place])   [place] Pickard, Cammie Mcgee, MD   1 year ago Rash and nonspecific skin eruption   [place] Eulogio Bear, NP   1 year ago Vitamin B12 deficiency   [place] Pickard, Cammie Mcgee, MD   1 year ago Anemia, unspecified type   [place], Warren T, MD   2 years ago Weight loss, unintentional   [place], Cammie Mcgee, MD

## 2021-12-03 ENCOUNTER — Encounter: Payer: Self-pay | Admitting: Family Medicine

## 2021-12-03 ENCOUNTER — Ambulatory Visit (INDEPENDENT_AMBULATORY_CARE_PROVIDER_SITE_OTHER): Payer: Medicare Other | Admitting: Family Medicine

## 2021-12-03 VITALS — BP 112/58 | HR 61 | Ht 70.0 in | Wt 177.8 lb

## 2021-12-03 DIAGNOSIS — I2510 Atherosclerotic heart disease of native coronary artery without angina pectoris: Secondary | ICD-10-CM

## 2021-12-03 DIAGNOSIS — I10 Essential (primary) hypertension: Secondary | ICD-10-CM

## 2021-12-03 DIAGNOSIS — Z23 Encounter for immunization: Secondary | ICD-10-CM

## 2021-12-03 DIAGNOSIS — E1149 Type 2 diabetes mellitus with other diabetic neurological complication: Secondary | ICD-10-CM

## 2021-12-03 DIAGNOSIS — C220 Liver cell carcinoma: Secondary | ICD-10-CM

## 2021-12-03 DIAGNOSIS — Z9861 Coronary angioplasty status: Secondary | ICD-10-CM | POA: Diagnosis not present
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
## 2021-12-03 NOTE — Addendum Note (Signed)
Addended by: Randal Buba K on: 12/03/2021 11:45 AM   Modules accepted: Orders

## 2021-12-04 ENCOUNTER — Other Ambulatory Visit: Payer: Self-pay

## 2021-12-04 LAB — LIPID PANEL
Cholesterol: 93 mg/dL (ref <200)
HDL: 33 mg/dL — ABNORMAL LOW (ref >=40)
LDL Cholesterol (Calc): 40 mg/dL (calc)
Non-HDL Cholesterol (Calc): 60 mg/dL (calc) (ref <130)
Total CHOL/HDL Ratio: 2.8 (calc) (ref <5.0)
Triglycerides: 115 mg/dL (ref <150)

## 2021-12-04 LAB — COMPLETE METABOLIC PANEL WITH GFR
AG Ratio: 1.4 (calc) (ref 1.0–2.5)
ALT: 13 U/L (ref 9–46)
AST: 14 U/L (ref 10–35)
Albumin: 4.2 g/dL (ref 3.6–5.1)
Alkaline phosphatase (APISO): 108 U/L (ref 35–144)
BUN: 22 mg/dL (ref 7–25)
CO2: 25 mmol/L (ref 20–32)
Calcium: 9.7 mg/dL (ref 8.6–10.3)
Chloride: 99 mmol/L (ref 98–110)
Creat: 0.88 mg/dL (ref 0.70–1.22)
Globulin: 2.9 g/dL (calc) (ref 1.9–3.7)
Glucose, Bld: 146 mg/dL — ABNORMAL HIGH (ref 65–99)
Potassium: 4.8 mmol/L (ref 3.5–5.3)
Sodium: 134 mmol/L — ABNORMAL LOW (ref 135–146)
Total Bilirubin: 0.6 mg/dL (ref 0.2–1.2)
Total Protein: 7.1 g/dL (ref 6.1–8.1)
eGFR: 86 mL/min/{1.73_m2} (ref >=60)

## 2021-12-04 LAB — HEMOGLOBIN A1C
Hgb A1c MFr Bld: 7.6 % of total Hgb — ABNORMAL HIGH (ref <5.7)
Mean Plasma Glucose: 171 mg/dL
eAG (mmol/L): 9.5 mmol/L

## 2021-12-04 LAB — PROTEIN / CREATININE RATIO, URINE
Creatinine, Urine: 325 mg/dL — ABNORMAL HIGH (ref 20–320)
Protein/Creat Ratio: 77 mg/g creat (ref 25–148)
Protein/Creatinine Ratio: 0.077 mg/mg creat (ref 0.025–0.148)
Total Protein, Urine: 25 mg/dL (ref 5–25)

## 2021-12-04 MED ORDER — PIOGLITAZONE HCL 30 MG PO TABS: 30.0000 mg | ORAL_TABLET | Freq: Every day | ORAL | 0 refills | Status: DC

## 2021-12-31 ENCOUNTER — Ambulatory Visit (INDEPENDENT_AMBULATORY_CARE_PROVIDER_SITE_OTHER): Payer: Medicare Other

## 2021-12-31 DIAGNOSIS — I495 Sick sinus syndrome: Secondary | ICD-10-CM

## 2021-12-31 LAB — CUP PACEART REMOTE DEVICE CHECK
Battery Remaining Longevity: 149 mo
Battery Voltage: 3.03 V
Brady Statistic AP VP Percent: 0.03 %
Brady Statistic AP VS Percent: 65.34 %
Brady Statistic AS VP Percent: 0.05 %
Brady Statistic AS VS Percent: 34.58 %
Brady Statistic RA Percent Paced: 65.39 %
Brady Statistic RV Percent Paced: 0.08 %
Date Time Interrogation Session: 20231127212232
Implantable Pulse Generator Implant Date: 20220420
Lead Channel Impedance Value: 342 Ohm
Lead Channel Impedance Value: 361 Ohm
Lead Channel Impedance Value: 494 Ohm
Lead Channel Impedance Value: 570 Ohm
Lead Channel Pacing Threshold Amplitude: 0.5 V
Lead Channel Pacing Threshold Amplitude: 1.375 V
Lead Channel Pacing Threshold Pulse Width: 0.4 ms
Lead Channel Pacing Threshold Pulse Width: 0.4 ms
Lead Channel Sensing Intrinsic Amplitude: 2.25 mV
Lead Channel Sensing Intrinsic Amplitude: 2.25 mV
Lead Channel Sensing Intrinsic Amplitude: 7.125 mV
Lead Channel Sensing Intrinsic Amplitude: 7.125 mV
Lead Channel Setting Pacing Amplitude: 1.5 V
Lead Channel Setting Pacing Amplitude: 2.75 V
Lead Channel Setting Pacing Pulse Width: 0.4 ms
Lead Channel Setting Sensing Sensitivity: 1.2 mV
Zone Setting Status: 755011
Zone Setting Status: 755011

## 2022-01-06 ENCOUNTER — Other Ambulatory Visit: Payer: Self-pay

## 2022-01-06 DIAGNOSIS — E0800 Diabetes mellitus due to underlying condition with hyperosmolarity without nonketotic hyperglycemic-hyperosmolar coma (NKHHC): Secondary | ICD-10-CM

## 2022-01-06 MED ORDER — PIOGLITAZONE HCL 30 MG PO TABS: 30.0000 mg | ORAL_TABLET | Freq: Every day | ORAL | 0 refills | Status: DC

## 2022-01-06 MED ORDER — SITAGLIPTIN PHOSPHATE 100 MG PO TABS: 100.0000 mg | ORAL_TABLET | Freq: Every day | ORAL | 3 refills | Status: DC

## 2022-01-23 ENCOUNTER — Other Ambulatory Visit: Payer: Self-pay | Admitting: Family Medicine

## 2022-01-23 NOTE — Telephone Encounter (Signed)
PT NEED CPE APPT W/PCP FOR FUTURE REFILLS  

## 2022-01-30 NOTE — Progress Notes (Signed)
Remote pacemaker transmission.   

## 2022-02-05 ENCOUNTER — Other Ambulatory Visit: Payer: Self-pay

## 2022-02-05 DIAGNOSIS — Z79899 Other long term (current) drug therapy: Secondary | ICD-10-CM

## 2022-02-06 ENCOUNTER — Other Ambulatory Visit: Payer: Self-pay | Admitting: Family Medicine

## 2022-02-13 NOTE — Progress Notes (Signed)
Care Management & Coordination Services Pharmacy Note  02/20/2022 Name:  [name] [name] MRN:  [id number] DOB:  19-Jan-1941  Summary: PharmD Initial visit.  A1c slightly above goal, recent start of Actos.  Not checking his glucose at home, daughter requested another meter.  No fasting sugars to discuss.  Tolerating Actos well.  Is having some increased urination and wetting himself occasionally.  Physical in Feb.  Recommendations/Changes made from today's visit: Consider SGLT-2 (elevated urine creatinine) if remains elevated and A1c above goal at physical. Send rx for meter/strips  Follow up plan: FU 3 months CMA to check glucose once he gets meter   Subjective: [name] [name] is an 82 y.o. year old male who is a primary patient of Pickard, Cammie Mcgee, MD.  The care coordination team was consulted for assistance with disease management and care coordination needs.    Engaged with patient by telephone for initial visit.  Recent office visits:  12/03/21 Jenna Luo, MD - Family Medicine - Hypertension - Labs were ordered. Flu vaccine administered. Metformin D/C'd Januvia and Actos added to regimen. Follow up as scheduled.    09/09/21 Jenna Luo, MD - Family Medicine - Diabetes - Metformin (GLUCOPHAGE) 1000 MG tablet  prescribed. Januvia 100 mg daily added and uptitrating his metformin to 1000 mg twice daily.  Follow up in 3 months.    Recent consult visits:  10/08/21 Cristopher Peru, MD - Cardiology - Sinus mode dysfunction - EKG performed. Metformin (GLUCOPHAGE) 1000 MG tablet  prescribed, Follow up in 1 year.   Hospital visits:  None in previous 6 months   Objective:  Lab Results  Component Value Date   CREATININE 0.88 12/03/2021   BUN 22 12/03/2021   EGFR 86 12/03/2021   GFRNONAA 83 06/15/2020   GFRAA 96 06/15/2020   NA 134 (L) 12/03/2021   K 4.8 12/03/2021   CALCIUM 9.7 12/03/2021   CO2 25 12/03/2021   GLUCOSE 146 (H) 12/03/2021    Lab Results  Component Value  Date/Time   HGBA1C 7.6 (H) 12/03/2021 11:24 AM   HGBA1C 8.9 (H) 08/13/2021 12:07 PM   MICROALBUR CANCELED 06/15/2020 09:11 AM   MICROALBUR 1.9 06/15/2020 09:11 AM    Last diabetic Eye exam: No results found for: "HMDIABEYEEXA"  Last diabetic Foot exam: No results found for: "HMDIABFOOTEX"   Lab Results  Component Value Date   CHOL 93 12/03/2021   HDL 33 (L) 12/03/2021   LDLCALC 40 12/03/2021   TRIG 115 12/03/2021   CHOLHDL 2.8 12/03/2021       Latest Ref Rng & Units 12/03/2021   11:24 AM 08/13/2021   12:07 PM 06/15/2020    9:11 AM  Hepatic Function  Total Protein 6.1 - 8.1 g/dL 7.1  7.0  7.3   AST 10 - 35 U/L '14  13  19   '$ ALT 9 - 46 U/L '13  11  17   '$ Total Bilirubin 0.2 - 1.2 mg/dL 0.6  0.6  0.7     Lab Results  Component Value Date/Time   TSH 3.25 09/23/2018 10:34 AM   TSH 2.80 09/24/2017 12:09 PM       Latest Ref Rng & Units 08/13/2021   12:07 PM 06/15/2020    9:11 AM 05/23/2020    8:57 AM  CBC  WBC 3.8 - 10.8 Thousand/uL 5.9  5.8  5.2   Hemoglobin 13.2 - 17.1 g/dL 12.7  12.6  12.5   Hematocrit 38.5 - 50.0 % 38.0  39.4  38.2  Platelets 140 - 400 Thousand/uL 111  130  121     Lab Results  Component Value Date/Time   VITAMINB12 1,005 03/09/2020 02:22 PM   VITAMINB12 >2,000 (H) 04/08/2019 12:28 PM    Clinical ASCVD: Yes  The ASCVD Risk score (Arnett DK, et al., 2019) failed to calculate for the following reasons:   The 2019 ASCVD risk score is only valid for ages 67 to 5        12/03/2021   11:00 AM 09/09/2021   10:31 AM 07/05/2020    8:55 AM  Depression screen PHQ 2/9  Decreased Interest 1 0 0  Down, Depressed, Hopeless 1 0 0  PHQ - 2 Score 2 0 0  Altered sleeping 1  0  Tired, decreased energy 1  0  Change in appetite 1  0  Feeling bad or failure about yourself  1  0  Trouble concentrating 1  0  Moving slowly or fidgety/restless 1  0  Suicidal thoughts 0  0  PHQ-9 Score 8  0  Difficult doing work/chores Not difficult at all  Not difficult at all      Social History   Tobacco Use  Smoking Status Former   Types: Cigarettes   Quit date: 02/04/2003   Years since quitting: 19.0  Smokeless Tobacco Never   BP Readings from Last 3 Encounters:  12/03/21 (!) 112/58  10/08/21 110/60  09/09/21 (!) 120/56   Pulse Readings from Last 3 Encounters:  12/03/21 61  10/08/21 61  09/09/21 63   Wt Readings from Last 3 Encounters:  12/03/21 177 lb 12.8 oz (80.6 kg)  10/08/21 183 lb 6.4 oz (83.2 kg)  09/09/21 181 lb 3.2 oz (82.2 kg)   BMI Readings from Last 3 Encounters:  12/03/21 25.51 kg/m  10/08/21 26.32 kg/m  09/09/21 26.00 kg/m    No Known Allergies  Medications Reviewed Today     Reviewed by Edythe Clarity, RPH (Pharmacist) on 02/20/22 at 1246  Med List Status: <None>   Medication Order Taking? Sig Documenting Provider Last Dose Status Informant  aspirin 81 MG tablet [id number] Yes Take 81 mg by mouth daily. [provider] Taking Active Self  atorvastatin (LIPITOR) 40 MG tablet [id number] Yes Take 1 tablet (40 mg total) by mouth daily. [name], MD Taking Active   buPROPion (WELLBUTRIN XL) 150 MG 24 hr tablet [id number] Yes TAKE ONE TABLET BY MOUTH ONCE DAILY. [name], MD Taking Active   donepezil (ARICEPT) 5 MG tablet [id number] Yes TAKE (1) TABLET BY MOUTH AT BEDTIME. [name], MD Taking Active   fluticasone [facility] 50 MCG/ACT nasal spray [id number] Yes Place 2 sprays into both nostrils daily. [name], MD Taking Active   folic acid (FOLVITE) 1 MG tablet [id number] Yes TAKE (1) TABLET BY MOUTH ONCE DAILY. [name], MD Taking Active   furosemide (LASIX) 40 MG tablet [id number] Yes Take 1 tablet (40 mg total) by mouth daily as needed (leg swelling). [name], MD Taking Active   lisinopril (ZESTRIL) 2.5 MG tablet [id number] Yes Take 1 tablet (2.5 mg total) by mouth daily. [name], MD Taking Active   metoprolol tartrate (LOPRESSOR) 50 MG tablet [id number] Yes  TAKE (1) TABLET BY MOUTH TWICE DAILY. [name], MD Taking Active   nitroGLYCERIN (NITROSTAT) 0.4 MG SL tablet [id number] Yes Place 1 tablet (0.4 mg total) under the tongue every 5 (five) minutes x 3 doses as needed for chest pain. Cecilie Kicks  R, NP Taking Active   omeprazole (PRILOSEC) 20 MG capsule [id number] Yes TAKE 1 CAPSULE BY MOUTH TWICE DAILY. [name], MD Taking Active   pioglitazone (ACTOS) 30 MG tablet [id number] Yes TAKE ONE TABLET BY MOUTH ONCE DAILY. [name], MD Taking Active   sitaGLIPtin (JANUVIA) 100 MG tablet [id number] Yes Take 1 tablet (100 mg total) by mouth daily. [name], MD Taking Active   terazosin (HYTRIN) 5 MG capsule [id number] Yes TAKE 1 CAPSULE BY MOUTH EVERY EVENING. [name], MD Taking Active   venlafaxine XR (EFFEXOR-XR) 75 MG 24 hr capsule [id number] Yes TAKE 1 CAPSULE BY MOUTH ONCE DAILY. [name], MD Taking Active   vitamin B-12 (CYANOCOBALAMIN) 1000 MCG tablet [id number] Yes Take 1 tablet (1,000 mcg total) by mouth daily. [name], MD Taking Active             SDOH:  (Social Determinants of Health) assessments and interventions performed: Yes Financial Resource Strain: Low Risk  (02/20/2022)   Overall Financial Resource Strain (CARDIA)    Difficulty of Paying Living Expenses: Not hard at all   Food Insecurity: No Food Insecurity (02/20/2022)   Hunger Vital Sign    Worried About Running Out of Food in the Last Year: Never true    Ran Out of Food in the Last Year: Never true    SDOH Interventions    Flowsheet Row Clinical Support from 07/05/2020 in [place] Interventions   Food Insecurity Interventions Intervention Not Indicated  Housing Interventions Intervention Not Indicated  Transportation Interventions Intervention Not Indicated  Financial Strain Interventions Intervention Not Indicated  Physical Activity Interventions Intervention Not Indicated  Stress  Interventions Intervention Not Indicated  Social Connections Interventions Intervention Not Indicated      SDOH Screenings   Food Insecurity: No Food Insecurity (02/20/2022)  Housing: Low Risk  (07/05/2020)  Transportation Needs: No Transportation Needs (07/05/2020)  Alcohol Screen: Low Risk  (07/05/2020)  Depression (PHQ2-9): Medium Risk (12/03/2021)  Financial Resource Strain: Low Risk  (02/20/2022)  Physical Activity: Inactive (07/05/2020)  Social Connections: Socially Isolated (07/05/2020)  Stress: No Stress Concern Present (07/05/2020)  Tobacco Use: Medium Risk (12/03/2021)    Medication Assistance: None required.  Patient affirms current coverage meets needs.  Medication Access: Within the past 30 days, how often has patient missed a dose of medication? occasionally Is a pillbox or other method used to improve adherence? Yes  Factors that may affect medication adherence? transportation problems Are meds synced by current pharmacy? No  Are meds delivered by current pharmacy? No  Does patient experience delays in picking up medications due to transportation concerns? No   [facility] Reviewed: Is patient disadvantaged to use [facility]?: Yes  Current Rx insurance plan: Medicare/Medicaid Name and location of Current pharmacy:  [address], [address] [place] [address] Phone: [phone] Fax: [phone]  [facility] services reviewed with patient today?: Yes  Patient requests to transfer care to [facility]?: Yes  Reason patient declined to change pharmacies:  Interested in switch to [facility]  Compliance/Adherence/Medication fill history: Care Gaps: Care Gaps: Annual wellness visit in last year? Last one 07/05/20 due 09/10/22  Star Rating Drugs:  Medication:                Last Fill:         Day Supply   sitaGLIPtin '100mg'$       01/06/22  90 lisinopril 2.'5mg'$             12/24/21           90 atorvastatin '40mg'$        12/24/21          90 pioglitazone 30 MG     02/06/22              30   Assessment/Plan Hypertension/CAD(BP goal <130/80) -Controlled -Current treatment: Lisinopril 2.'5mg'$  Appropriate, Effective, Safe, Accessible Metoprolol tartrate '50mg'$  BID Appropriate, Effective, Safe, Accessible -Medications previously tried: Imdur  -Current home readings: not checking at home, office BP well controlled -Current dietary habits: eats what his kids bring him or usually frozen meals that he buys at the store -Current exercise habits: minimal, does not like to go out much -Denies hypotensive/hypertensive symptoms -Educated on BP goals and benefits of medications for prevention of heart attack, stroke and kidney damage; Daily salt intake goal < 2300 mg; Exercise goal of 150 minutes per week; Importance of home blood pressure monitoring; -Counseled to monitor BP at home if able to, document, and provide log at future appointments -Recommended to continue current medication Work to limit salt in processed foods, watch for dizziness or HA.  Hyperlipidemia: (LDL goal < 70) -Controlled -Current treatment: Atorvastatin '40mg'$  Appropriate, Effective, Safe, Accessible -Medications previously tried: none noted  -Current dietary patterns: see HTn -Current exercise habits: minimal -Educated on Cholesterol goals;  Benefits of statin for ASCVD risk reduction; Importance of limiting foods high in cholesterol; Exercise goal of 150 minutes per week; -Recommended to continue current medication LDL controlled at 40 most recently. No changes at this time, continue routine lipid screenings.  Diabetes (A1c goal <7%) -Uncontrolled -Current medications: Pioglitazone '30mg'$  Appropriate, Query effective, ,  Januvia '100mg'$  Appropriate, Query effective, ,  -Medications previously tried: metformin  -Current home glucose readings fasting glucose: no logs post prandial glucose: not checking -Denies  hypoglycemic/hyperglycemic symptoms -Current meal patterns:  breakfast: bacon, eggs, "basic stuff"  lunch: sandwiches, burritos  dinner: sandwiches, burritos, family home cooked meals snacks:  drinks:  -Current exercise: minimal to none -Educated on A1c and blood sugar goals; Complications of diabetes including kidney damage, retinal damage, and cardiovascular disease; Exercise goal of 150 minutes per week; Prevention and management of hypoglycemic episodes; Benefits of routine self-monitoring of blood sugar; -Counseled to check feet daily and get yearly eye exams -Recommended to continue current medication He has upcoming physical next month recheck A1c  - also recommend rechecking UACR.  Patient may benefit from addition of SGLT-2 such as jardiance due to higher urine creatinine over 300.  Only hesitation would be increased urination which he is already struggling with wetting his clothes.  Recheck labs and evaluate therapy after next physical.  Continue to work on diet mods.  Atrial Fibrillation (Goal: prevent stroke and major bleeding) -Controlled -CHADSVASC: 4 -Current treatment: Rate control: Metoprolol tartrate '50mg'$  BID Appropriate, Effective, Safe, Accessible Anticoagulation: none (may need to discuss with cardio) -Medications previously tried: none noted -Home BP and HR readings: not checking at home  -Counseled on increased risk of stroke due to Afib and benefits of anticoagulation for stroke prevention; -Recommended to continue current medication Will consult with [place] about anticoagulation based on CHADSASC score of at least 4.  No history of [place] noted.  Depression/Anxiety (Goal: Control symptoms) -Controlled -Current treatment: Venlafaxine ER '75mg'$  Appropriate, Effective, Safe, Accessible Bupropion XL '150mg'$  Appropriate, Effective, Safe, Accessible -Medications previously tried/failed: none noted -PHQ9:     12/03/2021   11:00  AM 09/09/2021   10:31 AM 07/05/2020     8:55 AM  PHQ9 SCORE ONLY  PHQ-9 Total Score 8 0 0   -Educated on Benefits of medication for symptom control -Recommended to continue current medication Recommend routine PHQ-9.  Discussed benefits of exercise on mental health encouraged him to get outside at least once a day.  Beverly Milch, PharmD, CPP Clinical Pharmacist Practitioner [place] [phone]

## 2022-02-19 ENCOUNTER — Telehealth: Payer: Self-pay | Admitting: Pharmacist

## 2022-02-19 NOTE — Progress Notes (Signed)
Care Management & Coordination Services Pharmacy Team  Reason for Encounter: Appointment Reminder  Contacted patient on 02/19/2022   Recent office visits:  12/03/21 Jenna Luo, MD - Family Medicine - Hypertension - Labs were ordered. Flu vaccine administered. Metformin D/C'd Januvia and Actos added to regimen. Follow up as scheduled.   09/09/21 Jenna Luo, MD - Family Medicine - Diabetes - Metformin (GLUCOPHAGE) 1000 MG tablet  prescribed. Januvia 100 mg daily added and uptitrating his metformin to 1000 mg twice daily.  Follow up in 3 months.   Recent consult visits:  10/08/21 Cristopher Peru, MD - Cardiology - Sinus mode dysfunction - EKG performed. Metformin (GLUCOPHAGE) 1000 MG tablet  prescribed, Follow up in 1 year.  Hospital visits:  None in previous 6 months  Medications: Outpatient Encounter Medications as of 02/19/2022  Medication Sig   aspirin 81 MG tablet Take 81 mg by mouth daily.   atorvastatin (LIPITOR) 40 MG tablet Take 1 tablet (40 mg total) by mouth daily.   buPROPion (WELLBUTRIN XL) 150 MG 24 hr tablet TAKE ONE TABLET BY MOUTH ONCE DAILY.   donepezil (ARICEPT) 5 MG tablet TAKE (1) TABLET BY MOUTH AT BEDTIME.   fluticasone (FLONASE) 50 MCG/ACT nasal spray Place 2 sprays into both nostrils daily.   folic acid (FOLVITE) 1 MG tablet TAKE (1) TABLET BY MOUTH ONCE DAILY.   furosemide (LASIX) 40 MG tablet Take 1 tablet (40 mg total) by mouth daily as needed (leg swelling).   lisinopril (ZESTRIL) 2.5 MG tablet Take 1 tablet (2.5 mg total) by mouth daily.   metoprolol tartrate (LOPRESSOR) 50 MG tablet TAKE (1) TABLET BY MOUTH TWICE DAILY.   nitroGLYCERIN (NITROSTAT) 0.4 MG SL tablet Place 1 tablet (0.4 mg total) under the tongue every 5 (five) minutes x 3 doses as needed for chest pain.   omeprazole (PRILOSEC) 20 MG capsule TAKE 1 CAPSULE BY MOUTH TWICE DAILY.   pioglitazone (ACTOS) 30 MG tablet TAKE ONE TABLET BY MOUTH ONCE DAILY.   sitaGLIPtin (JANUVIA) 100 MG tablet Take 1  tablet (100 mg total) by mouth daily.   terazosin (HYTRIN) 5 MG capsule TAKE 1 CAPSULE BY MOUTH EVERY EVENING.   venlafaxine XR (EFFEXOR-XR) 75 MG 24 hr capsule TAKE 1 CAPSULE BY MOUTH ONCE DAILY.   vitamin B-12 (CYANOCOBALAMIN) 1000 MCG tablet Take 1 tablet (1,000 mcg total) by mouth daily.   No facility-administered encounter medications on file as of 02/19/2022.   Lab Results  Component Value Date/Time   HGBA1C 7.6 (H) 12/03/2021 11:24 AM   HGBA1C 8.9 (H) 08/13/2021 12:07 PM   MICROALBUR CANCELED 06/15/2020 09:11 AM   MICROALBUR 1.9 06/15/2020 09:11 AM    BP Readings from Last 3 Encounters:  12/03/21 (!) 112/58  10/08/21 110/60  09/09/21 (!) 120/56      Patient contacted to confirm in office appointment with Leata Mouse  PharmD, on 02/20/22 at 9:00am.  Do you have any problems getting your medications? No If yes what types of problems are you experiencing?  Daughter would like for patient to be on an auto refill   What is your top health concern you would like to discuss at your upcoming visit?  Daughter and son are involved in helping patient. He currently lives alone and they help with laundry, grocery shopping, and transportation.   Have you seen any other providers since your last visit with PCP? No    Star Rating Drugs:  Medication:  Last Fill: Day Supply  sitaGLIPtin '100mg'$  01/06/22 90 lisinopril 2.'5mg'$  12/24/21  90 atorvastatin '40mg'$  12/24/21 90 pioglitazone 30 MG  02/06/22  30   Care Gaps: Annual wellness visit in last year? Last one 07/05/20 due 09/10/22  If Diabetic: Last eye exam / retinopathy screening: unknown Last diabetic foot exam: overdue   Future Appointments  Date Time Provider [place]  02/20/2022  9:00 AM Edythe Clarity, [facility] None  03/10/2022  8:00 AM [facility]  03/17/2022  3:00 PM [name], MD [facility]  03/28/2022  7:25 AM [facility] DEVICE REMOTES [facility]  06/27/2022  7:25 AM  [facility] DEVICE REMOTES [facility]  09/26/2022  7:25 AM [facility] DEVICE REMOTES [facility]  12/26/2022  7:25 AM [facility] DEVICE REMOTES [facility]

## 2022-02-20 ENCOUNTER — Other Ambulatory Visit: Payer: Self-pay | Admitting: Family Medicine

## 2022-02-20 ENCOUNTER — Ambulatory Visit: Payer: Medicare Other | Admitting: Pharmacist

## 2022-02-20 NOTE — Telephone Encounter (Signed)
Requested Prescriptions  Pending Prescriptions Disp Refills   folic acid (FOLVITE) 1 MG tablet [Pharmacy Med Name: FOLIC ACID 1 MG TABLET] 90 tablet 0    Sig: TAKE (1) TABLET BY MOUTH ONCE DAILY.     Endocrinology:  Vitamins Failed - 02/20/2022  2:17 PM      Failed - Valid encounter within last 12 months    Recent Outpatient Visits           1 year ago Controlled type 2 diabetes mellitus with complication, without long-term current use of insulin ([place])   [place] Pickard, Cammie Mcgee, MD   1 year ago Rash and nonspecific skin eruption   [place] Eulogio Bear, NP   1 year ago Vitamin B12 deficiency   [place] Pickard, Cammie Mcgee, MD   1 year ago Anemia, unspecified type   [place] [name], MD   2 years ago Weight loss, unintentional   [place], Cammie Mcgee, MD       Future Appointments             In 3 weeks Pickard, Cammie Mcgee, MD [place]

## 2022-02-24 ENCOUNTER — Other Ambulatory Visit: Payer: Self-pay

## 2022-02-24 DIAGNOSIS — E118 Type 2 diabetes mellitus with unspecified complications: Secondary | ICD-10-CM

## 2022-02-24 DIAGNOSIS — F0150 Vascular dementia without behavioral disturbance: Secondary | ICD-10-CM

## 2022-02-24 DIAGNOSIS — J302 Other seasonal allergic rhinitis: Secondary | ICD-10-CM

## 2022-02-24 DIAGNOSIS — E0800 Diabetes mellitus due to underlying condition with hyperosmolarity without nonketotic hyperglycemic-hyperosmolar coma (NKHHC): Secondary | ICD-10-CM

## 2022-02-24 DIAGNOSIS — N138 Other obstructive and reflux uropathy: Secondary | ICD-10-CM

## 2022-02-24 DIAGNOSIS — I2510 Atherosclerotic heart disease of native coronary artery without angina pectoris: Secondary | ICD-10-CM

## 2022-02-24 DIAGNOSIS — F322 Major depressive disorder, single episode, severe without psychotic features: Secondary | ICD-10-CM

## 2022-02-24 DIAGNOSIS — I10 Essential (primary) hypertension: Secondary | ICD-10-CM

## 2022-02-24 DIAGNOSIS — K219 Gastro-esophageal reflux disease without esophagitis: Secondary | ICD-10-CM

## 2022-02-24 DIAGNOSIS — E538 Deficiency of other specified B group vitamins: Secondary | ICD-10-CM

## 2022-02-24 MED ORDER — NITROGLYCERIN 0.4 MG SL SUBL: 0.4000 mg | SUBLINGUAL_TABLET | SUBLINGUAL | 4 refills | Status: DC | PRN

## 2022-02-24 MED ORDER — FLUTICASONE PROPIONATE 50 MCG/ACT NA SUSP: 2.0000 | Freq: Every day | NASAL | 6 refills | Status: DC

## 2022-02-24 MED ORDER — LISINOPRIL 2.5 MG PO TABS: 2.5000 mg | ORAL_TABLET | Freq: Every day | ORAL | 3 refills | Status: DC

## 2022-02-24 MED ORDER — BUPROPION HCL ER (XL) 150 MG PO TB24: 150.0000 mg | ORAL_TABLET | Freq: Every day | ORAL | 3 refills | Status: DC

## 2022-02-24 MED ORDER — FUROSEMIDE 40 MG PO TABS: 40.0000 mg | ORAL_TABLET | Freq: Every day | ORAL | 3 refills | Status: DC | PRN

## 2022-02-24 MED ORDER — DONEPEZIL HCL 5 MG PO TABS: ORAL_TABLET | ORAL | 3 refills | Status: DC

## 2022-02-24 MED ORDER — METOPROLOL TARTRATE 50 MG PO TABS: ORAL_TABLET | ORAL | 3 refills | Status: DC

## 2022-02-24 MED ORDER — VENLAFAXINE HCL ER 75 MG PO CP24: 75.0000 mg | ORAL_CAPSULE | Freq: Every day | ORAL | 3 refills | Status: DC

## 2022-02-24 MED ORDER — VITAMIN B-12 1000 MCG PO TABS: 1000.0000 ug | ORAL_TABLET | Freq: Every day | ORAL | 3 refills | Status: AC

## 2022-02-24 MED ORDER — PIOGLITAZONE HCL 30 MG PO TABS: 30.0000 mg | ORAL_TABLET | Freq: Every day | ORAL | 3 refills | Status: DC

## 2022-02-24 MED ORDER — OMEPRAZOLE 20 MG PO CPDR: 20.0000 mg | DELAYED_RELEASE_CAPSULE | Freq: Two times a day (BID) | ORAL | 3 refills | Status: DC

## 2022-02-24 MED ORDER — SITAGLIPTIN PHOSPHATE 100 MG PO TABS: 100.0000 mg | ORAL_TABLET | Freq: Every day | ORAL | 3 refills | Status: DC

## 2022-02-24 MED ORDER — TERAZOSIN HCL 5 MG PO CAPS: 5.0000 mg | ORAL_CAPSULE | Freq: Every evening | ORAL | 3 refills | Status: DC

## 2022-02-24 MED ORDER — FOLIC ACID 1 MG PO TABS: ORAL_TABLET | ORAL | 3 refills | Status: DC

## 2022-02-24 MED ORDER — ATORVASTATIN CALCIUM 40 MG PO TABS: 40.0000 mg | ORAL_TABLET | Freq: Every day | ORAL | 3 refills | Status: DC

## 2022-03-03 ENCOUNTER — Telehealth: Payer: Self-pay | Admitting: *Deleted

## 2022-03-03 NOTE — Patient Outreach (Signed)
  Care Coordination   03/03/2022 Name: [name] [name] MRN: [id number] DOB: Feb 20, 1940   Care Coordination Outreach Attempts:  An unsuccessful telephone outreach was attempted today to offer the patient information about available care coordination services as a benefit of their health plan.  Also sent text with Spanish translation. Pt or family member responded, "NO THANK YOU." Sent several follow up texts to find out why and to try other ways for pt to engage.  Follow Up Plan:  No further outreach attempts will be made at this time. We have been unable to contact the patient to offer or enroll patient in care coordination services  Encounter Outcome:  Pt. Refused   Care Coordination Interventions:  No, not indicated    SIG Zendayah Hardgrave C. Myrtie Neither, MSN, [facility] Gerontological Nurse Practitioner [facility] Care Management [phone]

## 2022-03-10 ENCOUNTER — Other Ambulatory Visit: Payer: Medicare Other

## 2022-03-10 DIAGNOSIS — E118 Type 2 diabetes mellitus with unspecified complications: Secondary | ICD-10-CM | POA: Diagnosis not present

## 2022-03-10 DIAGNOSIS — I25700 Atherosclerosis of coronary artery bypass graft(s), unspecified, with unstable angina pectoris: Secondary | ICD-10-CM | POA: Diagnosis not present

## 2022-03-10 DIAGNOSIS — I10 Essential (primary) hypertension: Secondary | ICD-10-CM

## 2022-03-11 LAB — CBC WITH DIFFERENTIAL/PLATELET
Absolute Monocytes: 419 cells/uL (ref 200–950)
Basophils Absolute: 42 cells/uL (ref 0–200)
Basophils Relative: 0.8 %
Eosinophils Absolute: 170 cells/uL (ref 15–500)
Eosinophils Relative: 3.2 %
HCT: 38.2 % — ABNORMAL LOW (ref 38.5–50.0)
Hemoglobin: 12.8 g/dL — ABNORMAL LOW (ref 13.2–17.1)
Lymphs Abs: 1076 cells/uL (ref 850–3900)
MCH: 30.1 pg (ref 27.0–33.0)
MCHC: 33.5 g/dL (ref 32.0–36.0)
MCV: 89.9 fL (ref 80.0–100.0)
MPV: 12.8 fL — ABNORMAL HIGH (ref 7.5–12.5)
Monocytes Relative: 7.9 %
Neutro Abs: 3593 cells/uL (ref 1500–7800)
Neutrophils Relative %: 67.8 %
Platelets: 112 10*3/uL — ABNORMAL LOW (ref 140–400)
RBC: 4.25 10*6/uL (ref 4.20–5.80)
RDW: 13 % (ref 11.0–15.0)
Total Lymphocyte: 20.3 %
WBC: 5.3 10*3/uL (ref 3.8–10.8)

## 2022-03-11 LAB — COMPLETE METABOLIC PANEL WITH GFR
AG Ratio: 1.2 (calc) (ref 1.0–2.5)
ALT: 12 U/L (ref 9–46)
AST: 14 U/L (ref 10–35)
Albumin: 3.8 g/dL (ref 3.6–5.1)
Alkaline phosphatase (APISO): 91 U/L (ref 35–144)
BUN: 22 mg/dL (ref 7–25)
CO2: 22 mmol/L (ref 20–32)
Calcium: 9.4 mg/dL (ref 8.6–10.3)
Chloride: 101 mmol/L (ref 98–110)
Creat: 0.93 mg/dL (ref 0.70–1.22)
Globulin: 3.1 g/dL (calc) (ref 1.9–3.7)
Glucose, Bld: 256 mg/dL — ABNORMAL HIGH (ref 65–99)
Potassium: 4.5 mmol/L (ref 3.5–5.3)
Sodium: 133 mmol/L — ABNORMAL LOW (ref 135–146)
Total Bilirubin: 0.4 mg/dL (ref 0.2–1.2)
Total Protein: 6.9 g/dL (ref 6.1–8.1)
eGFR: 82 mL/min/{1.73_m2} (ref >=60)

## 2022-03-11 LAB — HEMOGLOBIN A1C
Hgb A1c MFr Bld: 9.8 % of total Hgb — ABNORMAL HIGH (ref <5.7)
Mean Plasma Glucose: 235 mg/dL
eAG (mmol/L): 13 mmol/L

## 2022-03-13 ENCOUNTER — Telehealth: Payer: Self-pay | Admitting: Pharmacist

## 2022-03-13 NOTE — Progress Notes (Signed)
Care Management & Coordination Services Pharmacy Team   Reason for Encounter: Medication coordination and delivery  Contacted patient to discuss medications and coordinate delivery from [facility]. Spoke with patient on 03/14/2022  Cycle dispensing form sent to Leata Mouse for review.   Last adherence delivery date:      Patient is due for next adherence delivery on:  03/25/22  This delivery to include: Adherence Packaging  30 Days   Atorvastatin 633m Bupropion XL 1569mDonepezil 33m38molic Acid 1g Lisinopril 2.33mg333mtoprolol 50mg81mprazole 20mg 133mlitazone 30mg J34mia 100mg Te433msin 33mg Venl33mxine ER 733mg   Pa33mt declined the following medications this month:   No refill request needed.  Delivery scheduled for 03/25/22. Unable to speak with patient to confirm date.    Any concerns about your medications? No  How often do you forget or accidentally miss a dose? Rarely  Do you use a pillbox? No  Is patient in packaging Yes  If yes  What is the date on your next pill pack?  Any concerns or issues with your packaging?   Recent blood pressure readings are as follows: pt reports does not check  Recent blood glucose readings are as follows: 240 fasting (reports he had blood work done this week awaiting results)    Medications: Outpatient Encounter Medications as of 03/13/2022  Medication Sig   aspirin 81 MG tablet Take 81 mg by mouth daily.   atorvastatin (LIPITOR) 40 MG tablet Take 1 tablet (40 mg total) by mouth daily.   buPROPion (WELLBUTRIN XL) 150 MG 24 hr tablet Take 1 tablet (150 mg total) by mouth daily.   cyanocobalamin (VITAMIN B12) 1000 MCG tablet Take 1 tablet (1,000 mcg total) by mouth daily.   donepezil (ARICEPT) 5 MG tablet TAKE 1 (ONE) TABLET BY MOUTH AT BEDTIME.   fluticasone (FLONASE) 50 MCG/ACT nasal spray Place 2 sprays into both nostrils daily.   folic acid (FOLVITE) 1 MG tablet TAKE (1) TABLET BY MOUTH ONCE DAILY.   furosemide  (LASIX) 40 MG tablet Take 1 tablet (40 mg total) by mouth daily as needed (leg swelling).   lisinopril (ZESTRIL) 2.5 MG tablet Take 1 tablet (2.5 mg total) by mouth daily.   metoprolol tartrate (LOPRESSOR) 50 MG tablet TAKE (1) TABLET BY MOUTH TWICE DAILY.   nitroGLYCERIN (NITROSTAT) 0.4 MG SL tablet Place 1 tablet (0.4 mg total) under the tongue every 5 (five) minutes x 3 doses as needed for chest pain.   omeprazole (PRILOSEC) 20 MG capsule Take 1 capsule (20 mg total) by mouth 2 (two) times daily.   pioglitazone (ACTOS) 30 MG tablet Take 1 tablet (30 mg total) by mouth daily.   sitaGLIPtin (JANUVIA) 100 MG tablet Take 1 tablet (100 mg total) by mouth daily.   terazosin (HYTRIN) 5 MG capsule Take 1 capsule (5 mg total) by mouth every evening.   venlafaxine XR (EFFEXOR-XR) 75 MG 24 hr capsule Take 1 capsule (75 mg total) by mouth daily.   No facility-administered encounter medications on file as of 03/13/2022.   BP Readings from Last 3 Encounters:  12/03/21 (!) 112/58  10/08/21 110/60  09/09/21 (!) 120/56    Pulse Readings from Last 3 Encounters:  12/03/21 61  10/08/21 61  09/09/21 63    Lab Results  Component Value Date/Time   HGBA1C 9.8 (H) 03/10/2022 07:58 AM   HGBA1C 7.6 (H) 12/03/2021 11:24 AM   Lab Results  Component Value Date   CREATININE 0.93 03/10/2022   BUN 22 03/10/2022  GFRNONAA 83 06/15/2020   GFRAA 96 06/15/2020   NA 133 (L) 03/10/2022   K 4.5 03/10/2022   CALCIUM 9.4 03/10/2022   CO2 22 03/10/2022     Future Appointments  Date Time Provider [place]  03/17/2022  3:00 PM [name], MD [facility]  03/28/2022  7:25 AM [facility] DEVICE REMOTES [facility]  05/22/2022  2:00 PM Edythe Clarity, RPH [facility] None  06/27/2022  7:25 AM [facility] DEVICE REMOTES [facility]  09/26/2022  7:25 AM [facility] DEVICE REMOTES [facility]  12/26/2022  7:25 AM [facility] DEVICE REMOTES [facility]     [facility]

## 2022-03-17 ENCOUNTER — Ambulatory Visit (INDEPENDENT_AMBULATORY_CARE_PROVIDER_SITE_OTHER): Payer: Medicare Other | Admitting: Family Medicine

## 2022-03-17 ENCOUNTER — Encounter: Payer: Self-pay | Admitting: Family Medicine

## 2022-03-17 VITALS — BP 130/68 | HR 72 | Temp 98.6°F | Ht 70.0 in | Wt 185.0 lb

## 2022-03-17 DIAGNOSIS — I2510 Atherosclerotic heart disease of native coronary artery without angina pectoris: Secondary | ICD-10-CM

## 2022-03-17 DIAGNOSIS — F0150 Vascular dementia without behavioral disturbance: Secondary | ICD-10-CM | POA: Diagnosis not present

## 2022-03-17 DIAGNOSIS — I10 Essential (primary) hypertension: Secondary | ICD-10-CM

## 2022-03-17 DIAGNOSIS — E119 Type 2 diabetes mellitus without complications: Secondary | ICD-10-CM

## 2022-03-17 DIAGNOSIS — Z9861 Coronary angioplasty status: Secondary | ICD-10-CM | POA: Diagnosis not present

## 2022-03-17 DIAGNOSIS — E0800 Diabetes mellitus due to underlying condition with hyperosmolarity without nonketotic hyperglycemic-hyperosmolar coma (NKHHC): Secondary | ICD-10-CM

## 2022-03-17 DIAGNOSIS — Z0000 Encounter for general adult medical examination without abnormal findings: Secondary | ICD-10-CM

## 2022-03-17 MED ORDER — METFORMIN HCL 500 MG PO TABS: 1000.0000 mg | ORAL_TABLET | Freq: Two times a day (BID) | ORAL | 3 refills | Status: DC

## 2022-03-17 MED ORDER — SEMAGLUTIDE(0.25 OR 0.5MG/DOS) 2 MG/3ML ~~LOC~~ SOPN: 0.5000 mg | PEN_INJECTOR | SUBCUTANEOUS | 3 refills | Status: DC
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
## 2022-03-24 NOTE — Addendum Note (Signed)
Addended by: Jenna Luo T on: 03/24/2022 06:39 AM   Modules accepted: Level of Service

## 2022-04-11 ENCOUNTER — Telehealth: Payer: Self-pay | Admitting: Pharmacist

## 2022-04-11 NOTE — Progress Notes (Cosign Needed)
Care Management & Coordination Services Pharmacy Team   Reason for Encounter: Medication coordination and delivery   Contacted patient to discuss medications and coordinate delivery from [facility]. Spoke with patient on 04/11/2022  Cycle dispensing form sent to Leata Mouse, CPP for review.   Last adherence delivery date: recently onboarded      Patient is due for next adherence delivery on: 04/23/22  This delivery to include: Adherence Packaging  30 Days   Atorvastatin   40 mg    1 tablet daily  Bupropion XL  150 mg   1 tablet daily  Donepezil   5 mg    1 tablet daily Folic Acid   1 mg    1 tablet daily  Lisinopril   2.5 mg   1 tablet daily  Metoprolol   50 mg   1 tablet BID Omeprazole    20 mg    1 tablet BID Pioglitazone   30 mg     1 tablet daily  metformin   '500mg'$     2 tablets BID Terazosin   5 mg    1 tablet daily  Venlafaxine ER  75 mg   1 tablet daily  ozempic   '2mg'$ /48m  Inject weekly  Patient declined the following medications this month: None  No refill request needed.  Confirmed delivery date of 04/23/22, advised patient that pharmacy will contact them the morning of delivery.   Any concerns about your medications? No  How often do you forget or accidentally miss a dose? Rarely  Do you use a pillbox? No  Is patient in packaging Yes  If yes  What is the date on your next pill pack? 04/25/22  Any concerns or issues with your packaging?    Recent blood pressure readings are as follows: N/A  Recent blood glucose readings are as follows: 123 yesterday am fasting    Medications: Outpatient Encounter Medications as of 04/11/2022  Medication Sig   aspirin 81 MG tablet Take 81 mg by mouth daily.   atorvastatin (LIPITOR) 40 MG tablet Take 1 tablet (40 mg total) by mouth daily.   buPROPion (WELLBUTRIN XL) 150 MG 24 hr tablet Take 1 tablet (150 mg total) by mouth daily.   cyanocobalamin (VITAMIN B12) 1000 MCG tablet Take 1 tablet (1,000 mcg total) by mouth  daily.   donepezil (ARICEPT) 5 MG tablet TAKE 1 (ONE) TABLET BY MOUTH AT BEDTIME.   folic acid (FOLVITE) 1 MG tablet TAKE (1) TABLET BY MOUTH ONCE DAILY.   lisinopril (ZESTRIL) 2.5 MG tablet Take 1 tablet (2.5 mg total) by mouth daily.   metFORMIN (GLUCOPHAGE) 500 MG tablet Take 2 tablets (1,000 mg total) by mouth 2 (two) times daily with a meal.   metoprolol tartrate (LOPRESSOR) 50 MG tablet TAKE (1) TABLET BY MOUTH TWICE DAILY.   nitroGLYCERIN (NITROSTAT) 0.4 MG SL tablet Place 1 tablet (0.4 mg total) under the tongue every 5 (five) minutes x 3 doses as needed for chest pain.   omeprazole (PRILOSEC) 20 MG capsule Take 1 capsule (20 mg total) by mouth 2 (two) times daily.   pioglitazone (ACTOS) 30 MG tablet Take 1 tablet (30 mg total) by mouth daily.   Semaglutide,0.25 or 0.'5MG'$ /DOS, 2 MG/3ML SOPN Inject 0.5 mg into the skin once a week. Quit [place]  terazosin (HYTRIN) 5 MG capsule Take 1 capsule (5 mg total) by mouth every evening.   venlafaxine XR (EFFEXOR-XR) 75 MG 24 hr capsule Take 1 capsule (75 mg total) by mouth  daily.   No facility-administered encounter medications on file as of 04/11/2022.   BP Readings from Last 3 Encounters:  03/17/22 130/68  12/03/21 (!) 112/58  10/08/21 110/60    Pulse Readings from Last 3 Encounters:  03/17/22 72  12/03/21 61  10/08/21 61    Lab Results  Component Value Date/Time   HGBA1C 9.8 (H) 03/10/2022 07:58 AM   HGBA1C 7.6 (H) 12/03/2021 11:24 AM   Lab Results  Component Value Date   CREATININE 0.93 03/10/2022   BUN 22 03/10/2022   GFRNONAA 83 06/15/2020   GFRAA 96 06/15/2020   NA 133 (L) 03/10/2022   K 4.5 03/10/2022   CALCIUM 9.4 03/10/2022   CO2 22 03/10/2022     Future Appointments  Date Time Provider [place]  05/22/2022  2:00 PM Edythe Clarity, [facility] None  06/16/2022  3:45 PM [name], MD [facility]  06/27/2022  7:25 AM [facility] DEVICE REMOTES [facility]  09/26/2022  7:25 AM  [facility] DEVICE REMOTES [facility]  12/26/2022  7:25 AM [facility] DEVICE REMOTES [facility]    [facility], [facility]

## 2022-05-08 DIAGNOSIS — Z950 Presence of cardiac pacemaker: Secondary | ICD-10-CM | POA: Diagnosis not present

## 2022-05-08 DIAGNOSIS — C787 Secondary malignant neoplasm of liver and intrahepatic bile duct: Secondary | ICD-10-CM | POA: Diagnosis not present

## 2022-05-08 DIAGNOSIS — K5730 Diverticulosis of large intestine without perforation or abscess without bleeding: Secondary | ICD-10-CM | POA: Diagnosis not present

## 2022-05-09 ENCOUNTER — Telehealth: Payer: Self-pay | Admitting: Pharmacist

## 2022-05-09 ENCOUNTER — Telehealth: Payer: Self-pay

## 2022-05-09 NOTE — Progress Notes (Signed)
Care Management & Coordination Services Pharmacy Team   Reason for Encounter: Medication coordination and delivery   Contacted patient to discuss medications and coordinate delivery from [facility] pharmacy. Spoke with patient on 05/09/2022  Cycle dispensing form sent to Erskine Emery, CPP for review.   Last adherence delivery date: 04/23/22      Patient is due for next adherence delivery on: 05/22/22  This delivery to include: Adherence Packaging  30 Days   Atorvastatin   40 mg  Bupropion XL  150 mg  Donepezil   5 mg  Folic Acid   1 mg  Lisinopril   2.5 mg  Metoprolol   50 mg Omeprazole    20 mg  Pioglitazone   30 mg  metformin   500mg  Terazosin   5 mg  Venlafaxine ER  75 mg  *pt stopped Ozempic due to severe nausea and will be calling PCP office to discuss alternative*  Patient declined the following medications this month: None  No refill request needed.  Confirmed delivery date of 05/22/22, advised patient that pharmacy will contact them the morning of delivery.   Any concerns about your medications? No  How often do you forget or accidentally miss a dose? Rarely  Do you use a pillbox? No  Is patient in packaging Yes  If yes  What is the date on your next pill pack?  Any concerns or issues with your packaging?   Medications: Outpatient Encounter Medications as of 05/09/2022  Medication Sig   aspirin 81 MG tablet Take 81 mg by mouth daily.   atorvastatin (LIPITOR) 40 MG tablet Take 1 tablet (40 mg total) by mouth daily.   buPROPion (WELLBUTRIN XL) 150 MG 24 hr tablet Take 1 tablet (150 mg total) by mouth daily.   cyanocobalamin (VITAMIN B12) 1000 MCG tablet Take 1 tablet (1,000 mcg total) by mouth daily.   donepezil (ARICEPT) 5 MG tablet TAKE 1 (ONE) TABLET BY MOUTH AT BEDTIME.   folic acid (FOLVITE) 1 MG tablet TAKE (1) TABLET BY MOUTH ONCE DAILY.   lisinopril (ZESTRIL) 2.5 MG tablet Take 1 tablet (2.5 mg total) by mouth daily.   metFORMIN (GLUCOPHAGE) 500 MG  tablet Take 2 tablets (1,000 mg total) by mouth 2 (two) times daily with a meal.   metoprolol tartrate (LOPRESSOR) 50 MG tablet TAKE (1) TABLET BY MOUTH TWICE DAILY.   nitroGLYCERIN (NITROSTAT) 0.4 MG SL tablet Place 1 tablet (0.4 mg total) under the tongue every 5 (five) minutes x 3 doses as needed for chest pain.   omeprazole (PRILOSEC) 20 MG capsule Take 1 capsule (20 mg total) by mouth 2 (two) times daily.   pioglitazone (ACTOS) 30 MG tablet Take 1 tablet (30 mg total) by mouth daily.   Semaglutide,0.25 or 0.5MG /DOS, 2 MG/3ML SOPN Inject 0.5 mg into the skin once a week. Quit [place]   terazosin (HYTRIN) 5 MG capsule Take 1 capsule (5 mg total) by mouth every evening.   venlafaxine XR (EFFEXOR-XR) 75 MG 24 hr capsule Take 1 capsule (75 mg total) by mouth daily.   No facility-administered encounter medications on file as of 05/09/2022.   BP Readings from Last 3 Encounters:  03/17/22 130/68  12/03/21 (!) 112/58  10/08/21 110/60    Pulse Readings from Last 3 Encounters:  03/17/22 72  12/03/21 61  10/08/21 61    Lab Results  Component Value Date/Time   HGBA1C 9.8 (H) 03/10/2022 07:58 AM   HGBA1C 7.6 (H) 12/03/2021 11:24 AM   Lab Results  Component Value Date   CREATININE 0.93 03/10/2022   BUN 22 03/10/2022   GFRNONAA 83 06/15/2020   GFRAA 96 06/15/2020   NA 133 (L) 03/10/2022   K 4.5 03/10/2022   CALCIUM 9.4 03/10/2022   CO2 22 03/10/2022     Future Appointments  Date Time Provider Department Center  05/22/2022  2:00 PM Erroll LunaDavis, Christian L, [facility] None  06/16/2022  3:45 PM Donita BrooksPickard, Warren T, MD [facility]  06/27/2022  7:25 AM [facility] DEVICE REMOTES [facility]  09/26/2022  7:25 AM [facility] DEVICE REMOTES [facility]  12/26/2022  7:25 AM [facility] DEVICE REMOTES [facility]    [facility], [facility]

## 2022-05-09 NOTE — Telephone Encounter (Signed)
Pt's daughter called in stating that pt has stopped taking this med Semaglutide,0.25 or 0.5MG /DOS, 2 MG/3ML SOPN [[id number]] due to the diarrhea and constant upset stomach. Pt's daughter just wants to know is this ok being that pt is already on other meds to help with diabetes. Please advise.  Cb#: [phone]

## 2022-05-12 ENCOUNTER — Other Ambulatory Visit: Payer: Self-pay

## 2022-05-12 DIAGNOSIS — B182 Chronic viral hepatitis C: Secondary | ICD-10-CM

## 2022-05-12 DIAGNOSIS — C220 Liver cell carcinoma: Secondary | ICD-10-CM | POA: Diagnosis not present

## 2022-05-12 DIAGNOSIS — C787 Secondary malignant neoplasm of liver and intrahepatic bile duct: Secondary | ICD-10-CM | POA: Diagnosis not present

## 2022-05-12 DIAGNOSIS — E0800 Diabetes mellitus due to underlying condition with hyperosmolarity without nonketotic hyperglycemic-hyperosmolar coma (NKHHC): Secondary | ICD-10-CM

## 2022-05-12 MED ORDER — SITAGLIPTIN PHOSPHATE 100 MG PO TABS
100.0000 mg | ORAL_TABLET | Freq: Every day | ORAL | 1 refills | Status: DC
Start: 2022-05-12 — End: 2022-10-17

## 2022-05-21 ENCOUNTER — Telehealth: Payer: Self-pay | Admitting: Pharmacist

## 2022-05-21 NOTE — Progress Notes (Signed)
Care Management & Coordination Services Pharmacy Team   Reason for Encounter: Appointment Reminder   Contacted patient to confirm telephone appointment with Erskine Emery, PharmD on 05/22/22 at 2 PM. Spoke with patient on 05/21/2022   Do you have any problems getting your medications? No If yes what types of problems are you experiencing?  N/A  What is your top health concern you would like to discuss at your upcoming visit?  Patient denied any new health concerns to discuss.   Have you seen any other providers since your last visit with PCP? No     Future Appointments  Date Time Provider Department Center  05/22/2022  2:00 PM Erroll Luna, [facility] None  05/22/2022  2:30 PM Donita Brooks, MD [facility]  06/16/2022  3:45 PM Donita Brooks, MD [facility]  06/27/2022  7:25 AM [facility] DEVICE REMOTES [facility]  09/26/2022  7:25 AM [facility] DEVICE REMOTES [facility]  12/26/2022  7:25 AM [facility] DEVICE REMOTES [facility]    [employer], [facility]

## 2022-05-22 ENCOUNTER — Ambulatory Visit: Payer: Medicare Other | Admitting: Pharmacist

## 2022-05-22 ENCOUNTER — Ambulatory Visit: Payer: Medicare Other | Admitting: Family Medicine

## 2022-05-22 NOTE — Progress Notes (Signed)
Care Management & Coordination Services Pharmacy Note  05/22/2022 Name:  [name] [name] MRN:  [id number] DOB:  24-Aug-1940  Summary: PharmD FU visit.  Patient had to stop Ozempic due to nausea and vomiting.  Resumed Januvia to replace this.  Reports glucose controlled in the 120s - no specific logs reviewed.  Has 3 month FU in may.  Recommendations/Changes made from today's visit: Recheck A1c in May, if it has dropped we could consider SGLT-2 for more glucose control.  Would caution if A1c > 9 due to UTI risk.  Follow up plan: FU 3 months    Subjective: [name] [name] is an 82 y.o. year old male who is a primary patient of Pickard, Priscille Heidelberg, MD.  The care coordination team was consulted for assistance with disease management and care coordination needs.    Engaged with patient by telephone for initial visit.  Recent office visits:  12/03/21 Lynnea Ferrier, MD - Family Medicine - Hypertension - Labs were ordered. Flu vaccine administered. Metformin D/C'd Januvia and Actos added to regimen. Follow up as scheduled.    09/09/21 Lynnea Ferrier, MD - Family Medicine - Diabetes - Metformin (GLUCOPHAGE) 1000 MG tablet  prescribed. Januvia 100 mg daily added and uptitrating his metformin to 1000 mg twice daily.  Follow up in 3 months.    Recent consult visits:  10/08/21 Lewayne Bunting, MD - Cardiology - Sinus mode dysfunction - EKG performed. Metformin (GLUCOPHAGE) 1000 MG tablet  prescribed, Follow up in 1 year.   Hospital visits:  None in previous 6 months   Objective:  Lab Results  Component Value Date   CREATININE 0.93 03/10/2022   BUN 22 03/10/2022   EGFR 82 03/10/2022   GFRNONAA 83 06/15/2020   GFRAA 96 06/15/2020   NA 133 (L) 03/10/2022   K 4.5 03/10/2022   CALCIUM 9.4 03/10/2022   CO2 22 03/10/2022   GLUCOSE 256 (H) 03/10/2022    Lab Results  Component Value Date/Time   HGBA1C 9.8 (H) 03/10/2022 07:58 AM   HGBA1C 7.6 (H) 12/03/2021 11:24 AM   MICROALBUR CANCELED 06/15/2020  09:11 AM   MICROALBUR 1.9 06/15/2020 09:11 AM    Last diabetic Eye exam: No results found for: "HMDIABEYEEXA"  Last diabetic Foot exam: No results found for: "HMDIABFOOTEX"   Lab Results  Component Value Date   CHOL 93 12/03/2021   HDL 33 (L) 12/03/2021   LDLCALC 40 12/03/2021   TRIG 115 12/03/2021   CHOLHDL 2.8 12/03/2021       Latest Ref Rng & Units 03/10/2022    7:58 AM 12/03/2021   11:24 AM 08/13/2021   12:07 PM  Hepatic Function  Total Protein 6.1 - 8.1 g/dL 6.9  7.1  7.0   AST 10 - 35 U/L 14  14  13    ALT 9 - 46 U/L 12  13  11    Total Bilirubin 0.2 - 1.2 mg/dL 0.4  0.6  0.6     Lab Results  Component Value Date/Time   TSH 3.25 09/23/2018 10:34 AM   TSH 2.80 09/24/2017 12:09 PM       Latest Ref Rng & Units 03/10/2022    7:58 AM 08/13/2021   12:07 PM 06/15/2020    9:11 AM  CBC  WBC 3.8 - 10.8 Thousand/uL 5.3  5.9  5.8   Hemoglobin 13.2 - 17.1 g/dL 40.9  81.1  91.4   Hematocrit 38.5 - 50.0 % 38.2  38.0  39.4   Platelets 140 - 400 Thousand/uL 112  111  130     Lab Results  Component Value Date/Time   VITAMINB12 1,005 03/09/2020 02:22 PM   VITAMINB12 >2,000 (H) 04/08/2019 12:28 PM    Clinical ASCVD: Yes  The ASCVD Risk score (Arnett DK, et al., 2019) failed to calculate for the following reasons:   The 2019 ASCVD risk score is only valid for ages 57 to 23        12/03/2021   11:00 AM 09/09/2021   10:31 AM 07/05/2020    8:55 AM  Depression screen PHQ 2/9  Decreased Interest 1 0 0  Down, Depressed, Hopeless 1 0 0  PHQ - 2 Score 2 0 0  Altered sleeping 1  0  Tired, decreased energy 1  0  Change in appetite 1  0  Feeling bad or failure about yourself  1  0  Trouble concentrating 1  0  Moving slowly or fidgety/restless 1  0  Suicidal thoughts 0  0  PHQ-9 Score 8  0  Difficult doing work/chores Not difficult at all  Not difficult at all     Social History   Tobacco Use  Smoking Status Former   Types: Cigarettes   Quit date: 02/04/2003   Years since  quitting: 19.3  Smokeless Tobacco Never   BP Readings from Last 3 Encounters:  03/17/22 130/68  12/03/21 (!) 112/58  10/08/21 110/60   Pulse Readings from Last 3 Encounters:  03/17/22 72  12/03/21 61  10/08/21 61   Wt Readings from Last 3 Encounters:  03/17/22 185 lb (83.9 kg)  12/03/21 177 lb 12.8 oz (80.6 kg)  10/08/21 183 lb 6.4 oz (83.2 kg)   BMI Readings from Last 3 Encounters:  03/17/22 26.54 kg/m  12/03/21 25.51 kg/m  10/08/21 26.32 kg/m    No Known Allergies  Medications Reviewed Today     Reviewed by Erroll Luna, [facility] (Pharmacist) on 05/22/22 at 1609  Med List Status: <None>   Medication Order Taking? Sig Documenting Provider Last Dose Status Informant  aspirin 81 MG tablet [id number] No Take 81 mg by mouth daily. [provider] Taking Active Self  atorvastatin (LIPITOR) 40 MG tablet [id number] No Take 1 tablet (40 mg total) by mouth daily. Donita Brooks, MD Taking Active   buPROPion (WELLBUTRIN XL) 150 MG 24 hr tablet [id number] No Take 1 tablet (150 mg total) by mouth daily. Donita Brooks, MD Taking Active   cyanocobalamin (VITAMIN B12) 1000 MCG tablet [id number] No Take 1 tablet (1,000 mcg total) by mouth daily. Donita Brooks, MD Taking Active   donepezil (ARICEPT) 5 MG tablet [id number] No TAKE 1 (ONE) TABLET BY MOUTH AT BEDTIME. Donita Brooks, MD Taking Active   folic acid (FOLVITE) 1 MG tablet [id number] No TAKE (1) TABLET BY MOUTH ONCE DAILY. Donita Brooks, MD Taking Active   lisinopril (ZESTRIL) 2.5 MG tablet [id number] No Take 1 tablet (2.5 mg total) by mouth daily. Donita Brooks, MD Taking Active   metFORMIN (GLUCOPHAGE) 500 MG tablet [id number]  Take 2 tablets (1,000 mg total) by mouth 2 (two) times daily with a meal. Donita Brooks, MD  Active   metoprolol tartrate (LOPRESSOR) 50 MG tablet [id number] No TAKE (1) TABLET BY MOUTH TWICE DAILY. Donita Brooks, MD Taking Active   nitroGLYCERIN (NITROSTAT) 0.4 MG SL tablet  [id number] No Place 1 tablet (0.4 mg total) under the tongue every 5 (five) minutes x 3 doses as needed for chest pain. Donita Brooks, MD Taking  Active   omeprazole (PRILOSEC) 20 MG capsule [id number] No Take 1 capsule (20 mg total) by mouth 2 (two) times daily. Donita Brooks, MD Taking Active   pioglitazone (ACTOS) 30 MG tablet [id number] No Take 1 tablet (30 mg total) by mouth daily. Donita Brooks, MD Taking Active   sitaGLIPtin (JANUVIA) 100 MG tablet [id number]  Take 1 tablet (100 mg total) by mouth daily. Donita Brooks, MD  Active   terazosin (HYTRIN) 5 MG capsule [id number] No Take 1 capsule (5 mg total) by mouth every evening. Donita Brooks, MD Taking Active   venlafaxine XR (EFFEXOR-XR) 75 MG 24 hr capsule [id number] No Take 1 capsule (75 mg total) by mouth daily. Donita Brooks, MD Taking Active             SDOH:  (Social Determinants of Health) assessments and interventions performed: Yes Financial Resource Strain: Low Risk  (02/20/2022)   Overall Financial Resource Strain (CARDIA)    Difficulty of Paying Living Expenses: Not hard at all   Food Insecurity: No Food Insecurity (02/20/2022)   Hunger Vital Sign    Worried About Running Out of Food in the Last Year: Never true    Ran Out of Food in the Last Year: Never true    SDOH Interventions    Flowsheet Row Clinical Support from 07/05/2020 in [name]  SDOH Interventions   Food Insecurity Interventions Intervention Not Indicated  Housing Interventions Intervention Not Indicated  Transportation Interventions Intervention Not Indicated  Financial Strain Interventions Intervention Not Indicated  Physical Activity Interventions Intervention Not Indicated  Stress Interventions Intervention Not Indicated  Social Connections Interventions Intervention Not Indicated      SDOH Screenings   Food Insecurity: No Food Insecurity (02/20/2022)  Housing: Low Risk  (07/05/2020)  Transportation Needs:  No Transportation Needs (07/05/2020)  Alcohol Screen: Low Risk  (07/05/2020)  Depression (PHQ2-9): Medium Risk (12/03/2021)  Financial Resource Strain: Low Risk  (02/20/2022)  Physical Activity: Inactive (07/05/2020)  Social Connections: Socially Isolated (07/05/2020)  Stress: No Stress Concern Present (07/05/2020)  Tobacco Use: Medium Risk (03/17/2022)    Medication Assistance: None required.  Patient affirms current coverage meets needs.  Medication Access: Within the past 30 days, how often has patient missed a dose of medication? occasionally Is a pillbox or other method used to improve adherence? Yes  Factors that may affect medication adherence? transportation problems Are meds synced by current pharmacy? No  Are meds delivered by current pharmacy? No  Does patient experience delays in picking up medications due to transportation concerns? No   [facility] Reviewed: Is patient disadvantaged to use [facility]?: Yes  Current Rx insurance plan: Medicare/Medicaid Name and location of Current pharmacy:  [facility] - [place], [place] - [address] [place] [address] Phone: [phone] Fax: [phone]  [facility] - [place], [place] - [address] [place] [address] Phone: [phone] Fax: [phone]  [facility] services reviewed with patient today?: Yes  Patient requests to transfer care to [facility]?: Yes  Reason patient declined to change pharmacies:  Interested in switch to [facility]  Compliance/Adherence/Medication fill history: Care Gaps: Care Gaps: Annual wellness visit in last year? Last one 07/05/20 due 09/10/22  Star Rating Drugs:  Medication:                Last Fill:         Day Supply  sitaGLIPtin       01/06/22            90 lisinopril 2.5mg             12/24/21          90 atorvastatin        12/24/21          90 pioglitazone 30 MG     02/06/22               30   Assessment/Plan Hypertension/CAD(BP goal <130/80) -Controlled -Current treatment: Lisinopril 2.5mg  Appropriate, Effective, Safe, Accessible Metoprolol tartrate  BID Appropriate, Effective, Safe, Accessible -Medications previously tried: Imdur  -Current home readings: not checking at home, office BP well controlled -Current dietary habits: eats what his kids bring him or usually frozen meals that he buys at the store -Current exercise habits: minimal, does not like to go out much -Denies hypotensive/hypertensive symptoms -Educated on BP goals and benefits of medications for prevention of heart attack, stroke and kidney damage; Daily salt intake goal < 2300 mg; Exercise goal of 150 minutes per week; Importance of home blood pressure monitoring; -Counseled to monitor BP at home if able to, document, and provide log at future appointments -Recommended to continue current medication Work to limit salt in processed foods, watch for dizziness or HA.  Hyperlipidemia: (LDL goal < 70) -Controlled -Current treatment: Atorvastatin  Appropriate, Effective, Safe, Accessible -Medications previously tried: none noted  -Current dietary patterns: see HTn -Current exercise habits: minimal -Educated on Cholesterol goals;  Benefits of statin for ASCVD risk reduction; Importance of limiting foods high in cholesterol; Exercise goal of 150 minutes per week; -Recommended to continue current medication LDL controlled at 40 most recently. No changes at this time, continue routine lipid screenings.  Diabetes (A1c goal <7%) 05/22/22 -Uncontrolled, based on most recent A1c -Current medications: Pioglitazone  Appropriate, Query effective, ,  Januvia  Appropriate, Query effective, ,  Metformin  BID Appropriate, Query effective, ,  -Medications previously tried: Ozempic (nausea, diarrhea) -Current home glucose readings fasting glucose: 120s recently per patient and  sister post prandial glucose: not checking -Denies hypoglycemic/hyperglycemic symptoms -Current meal patterns:  breakfast: bacon, eggs, "basic stuff"  lunch: sandwiches, burritos  dinner: sandwiches, burritos, family home cooked meals snacks:  drinks:  -Current exercise: minimal to none -Educated on A1c and blood sugar goals; Complications of diabetes including kidney damage, retinal damage, and cardiovascular disease; Exercise goal of 150 minutes per week; Prevention and management of hypoglycemic episodes; Benefits of routine self-monitoring of blood sugar; -Counseled to check feet daily and get yearly eye exams -Was unable to tolerate Ozempic.  They have started back on Januvia to replace it.  Reports lots of nausea, vomiting and some diarrhea with Ozempic after trying for about 4 weeks.  These have resolved with stopping the medication. They have FU next month.  Would recommend recheck A1c - if it has decreased below 9 we could consider SGLT-2 but caution for UTI if above this.  Atrial Fibrillation (Goal: prevent stroke and major bleeding) -Controlled -CHADSVASC: 4 -Current treatment: Rate control: Metoprolol tartrate  BID Appropriate, Effective, Safe, Accessible Anticoagulation: none (may need to discuss with cardio) -Medications previously tried: none noted -Home BP and HR readings: not checking at home  -Counseled on increased risk of stroke due to Afib and benefits of anticoagulation for stroke prevention; -Recommended to continue current medication Will consult with Nahser about anticoagulation based on CHADSASC score of at least 4.  No history of OAC  noted.  Depression/Anxiety (Goal: Control symptoms) -Controlled -Current treatment: Venlafaxine ER 75mg  Appropriate, Effective, Safe, Accessible Bupropion XL 150mg  Appropriate, Effective, Safe, Accessible -Medications previously tried/failed: none noted -PHQ9:     12/03/2021   11:00 AM 09/09/2021   10:31 AM 07/05/2020     8:55 AM  PHQ9 SCORE ONLY  PHQ-9 Total Score 8 0 0   -Educated on Benefits of medication for symptom control -Recommended to continue current medication Recommend routine PHQ-9.  Discussed benefits of exercise on mental health encouraged him to get outside at least once a day.  Willa Frater, PharmD, CPP Clinical Pharmacist Practitioner [facility] [phone]

## 2022-05-26 ENCOUNTER — Encounter: Payer: Self-pay | Admitting: Family Medicine

## 2022-05-26 ENCOUNTER — Ambulatory Visit (INDEPENDENT_AMBULATORY_CARE_PROVIDER_SITE_OTHER): Payer: Medicare Other | Admitting: Family Medicine

## 2022-05-26 VITALS — BP 120/68 | HR 67 | Temp 97.6°F | Ht 70.0 in | Wt 187.0 lb

## 2022-05-26 DIAGNOSIS — J310 Chronic rhinitis: Secondary | ICD-10-CM | POA: Diagnosis not present

## 2022-05-26 MED ORDER — AZELASTINE HCL 0.1 % NA SOLN: 2.0000 | Freq: Two times a day (BID) | NASAL | 12 refills | Status: DC
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
## 2022-06-10 ENCOUNTER — Telehealth: Payer: Self-pay | Admitting: Pharmacist

## 2022-06-10 NOTE — Progress Notes (Signed)
Care Management & Coordination Services Pharmacy Team   Reason for Encounter: Medication coordination and delivery   Contacted patient to discuss medications and coordinate delivery from [facility]. Spoke with patient on 06/10/2022  Cycle dispensing form sent to [name] [name], CPP for review.   Last adherence delivery date: 05/22/22      Patient is due for next adherence delivery on: 06/23/22  This delivery to include: Adherence Packaging  30 Days   Expand All Collapse All Care Management & Coordination Services Pharmacy Team     Reason for Encounter: Medication coordination and delivery   Contacted patient to discuss medications and coordinate delivery from [facility]. Spoke with patient on 05/09/2022  Cycle dispensing form sent to [name] [name], CPP for review.    Last adherence delivery date: 04/23/22       Patient is due for next adherence delivery on: 05/22/22   This delivery to include: Adherence Packaging  30 Days    Atorvastatin                           40 mg  Bupropion XL                        150 mg  Donepezil                               5 mg  Folic Acid                               1 mg  Lisinopril                                2.5 mg  Metoprolol                              50 mg Omeprazole                           20 mg  Pioglitazone                           30 mg  Metformin                              500mg  Terazosin                               5 mg  Venlafaxine ER                      75 mg    Asteline Nasal spray  0.10%   Patient declined the following medications this month: None  No refill request needed.  Confirmed delivery date of 06/23/22, advised patient that pharmacy will contact them the morning of delivery.   Any concerns about your medications? No  How often do you forget or accidentally miss a dose? Rarely  Do you use a pillbox? No  Is patient in packaging Yes  If yes  What is the date on your next pill  pack?  Any concerns  or issues with your packaging?    Medications: Outpatient Encounter Medications as of 06/10/2022  Medication Sig   aspirin 81 MG tablet Take 81 mg by mouth daily.   atorvastatin (LIPITOR) 40 MG tablet Take 1 tablet (40 mg total) by mouth daily.   azelastine (ASTELIN) 0.1 % nasal spray Place 2 sprays into both nostrils 2 (two) times daily. Use in each nostril as directed   buPROPion (WELLBUTRIN XL) 150 MG 24 hr tablet Take 1 tablet (150 mg total) by mouth daily.   cyanocobalamin (VITAMIN B12) 1000 MCG tablet Take 1 tablet (1,000 mcg total) by mouth daily.   donepezil (ARICEPT) 5 MG tablet TAKE 1 (ONE) TABLET BY MOUTH AT BEDTIME.   folic acid (FOLVITE) 1 MG tablet TAKE (1) TABLET BY MOUTH ONCE DAILY.   lisinopril (ZESTRIL) 2.5 MG tablet Take 1 tablet (2.5 mg total) by mouth daily.   metFORMIN (GLUCOPHAGE) 500 MG tablet Take 2 tablets (1,000 mg total) by mouth 2 (two) times daily with a meal.   metoprolol tartrate (LOPRESSOR) 50 MG tablet TAKE (1) TABLET BY MOUTH TWICE DAILY.   nitroGLYCERIN (NITROSTAT) 0.4 MG SL tablet Place 1 tablet (0.4 mg total) under the tongue every 5 (five) minutes x 3 doses as needed for chest pain.   omeprazole (PRILOSEC) 20 MG capsule Take 1 capsule (20 mg total) by mouth 2 (two) times daily.   pioglitazone (ACTOS) 30 MG tablet Take 1 tablet (30 mg total) by mouth daily.   sitaGLIPtin (JANUVIA) 100 MG tablet Take 1 tablet (100 mg total) by mouth daily.   terazosin (HYTRIN) 5 MG capsule Take 1 capsule (5 mg total) by mouth every evening.   venlafaxine XR (EFFEXOR-XR) 75 MG 24 hr capsule Take 1 capsule (75 mg total) by mouth daily.   No facility-administered encounter medications on file as of 06/10/2022.   BP Readings from Last 3 Encounters:  05/26/22 120/68  03/17/22 130/68  12/03/21 (!) 112/58    Pulse Readings from Last 3 Encounters:  05/26/22 67  03/17/22 72  12/03/21 61    Lab Results  Component Value Date/Time   HGBA1C 9.8 (H)  03/10/2022 07:58 AM   HGBA1C 7.6 (H) 12/03/2021 11:24 AM   Lab Results  Component Value Date   CREATININE 0.93 03/10/2022   BUN 22 03/10/2022   GFRNONAA 83 06/15/2020   GFRAA 96 06/15/2020   NA 133 (L) 03/10/2022   K 4.5 03/10/2022   CALCIUM 9.4 03/10/2022   CO2 22 03/10/2022     Future Appointments  Date Time Provider Department Center  06/16/2022  3:45 PM Donita Brooks, MD [facility]  06/27/2022  7:25 AM [facility] DEVICE REMOTES [facility]  08/21/2022 12:00 PM Erroll Luna, RPH [facility] None  09/26/2022  7:25 AM [facility] DEVICE REMOTES [facility]  12/26/2022  7:25 AM [facility] DEVICE REMOTES [facility]     [employer], [facility]

## 2022-06-16 ENCOUNTER — Encounter: Payer: Self-pay | Admitting: Family Medicine

## 2022-06-16 ENCOUNTER — Ambulatory Visit (INDEPENDENT_AMBULATORY_CARE_PROVIDER_SITE_OTHER): Payer: Medicare Other | Admitting: Family Medicine

## 2022-06-16 VITALS — BP 160/72 | HR 60 | Temp 97.8°F | Ht 70.0 in | Wt 183.0 lb

## 2022-06-16 DIAGNOSIS — I2510 Atherosclerotic heart disease of native coronary artery without angina pectoris: Secondary | ICD-10-CM | POA: Diagnosis not present

## 2022-06-16 DIAGNOSIS — R0602 Shortness of breath: Secondary | ICD-10-CM | POA: Diagnosis not present

## 2022-06-16 DIAGNOSIS — Z9861 Coronary angioplasty status: Secondary | ICD-10-CM | POA: Diagnosis not present

## 2022-06-16 DIAGNOSIS — E0800 Diabetes mellitus due to underlying condition with hyperosmolarity without nonketotic hyperglycemic-hyperosmolar coma (NKHHC): Secondary | ICD-10-CM
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
## 2022-06-17 ENCOUNTER — Ambulatory Visit: Payer: Medicare Other | Admitting: Family Medicine

## 2022-06-17 LAB — BRAIN NATRIURETIC PEPTIDE: Brain Natriuretic Peptide: 141 pg/mL — ABNORMAL HIGH (ref <100)

## 2022-06-17 LAB — COMPLETE METABOLIC PANEL WITH GFR
AG Ratio: 1.5 (calc) (ref 1.0–2.5)
ALT: 13 U/L (ref 9–46)
AST: 15 U/L (ref 10–35)
Albumin: 4 g/dL (ref 3.6–5.1)
Alkaline phosphatase (APISO): 70 U/L (ref 35–144)
BUN: 18 mg/dL (ref 7–25)
CO2: 25 mmol/L (ref 20–32)
Calcium: 9.8 mg/dL (ref 8.6–10.3)
Chloride: 101 mmol/L (ref 98–110)
Creat: 0.74 mg/dL (ref 0.70–1.22)
Globulin: 2.7 g/dL (calc) (ref 1.9–3.7)
Glucose, Bld: 97 mg/dL (ref 65–99)
Potassium: 4.3 mmol/L (ref 3.5–5.3)
Sodium: 137 mmol/L (ref 135–146)
Total Bilirubin: 0.6 mg/dL (ref 0.2–1.2)
Total Protein: 6.7 g/dL (ref 6.1–8.1)
eGFR: 90 mL/min/{1.73_m2} (ref >=60)

## 2022-06-17 LAB — HEMOGLOBIN A1C
Hgb A1c MFr Bld: 7 % of total Hgb — ABNORMAL HIGH (ref <5.7)
Mean Plasma Glucose: 154 mg/dL
eAG (mmol/L): 8.5 mmol/L

## 2022-06-18 ENCOUNTER — Other Ambulatory Visit: Payer: Self-pay

## 2022-06-18 DIAGNOSIS — R7989 Other specified abnormal findings of blood chemistry: Secondary | ICD-10-CM

## 2022-06-18 DIAGNOSIS — I509 Heart failure, unspecified: Secondary | ICD-10-CM

## 2022-06-18 DIAGNOSIS — E538 Deficiency of other specified B group vitamins: Secondary | ICD-10-CM

## 2022-06-23 ENCOUNTER — Encounter: Payer: Self-pay | Admitting: *Deleted

## 2022-06-23 ENCOUNTER — Telehealth: Payer: Self-pay | Admitting: *Deleted

## 2022-06-23 NOTE — Patient Outreach (Signed)
  Care Coordination   Health Equity Plan Initial Visit Note   06/23/2022 Name: [name] [name] MRN: [id number] DOB: July 16, 1940  [name] [name] is a 82 y.o. year old male who sees Pickard, Priscille Heidelberg, MD for primary care. I spoke with  [name] by phone today.  What matters to the patients health and wellness today?  Managing blood sugar    Goals Addressed             This Visit's Progress    Patient will decrease A1C by 5% over the next 6 months       Care Coordination Goals: Patient will follow-up with PCP every 3 months or as recommended Patient will keep all scheduled medical appointments Echo pending scheduling Needs repeat labs in 3 months per PCP OV note on 06/16/22 Patient will take medication as prescribed and reach out to provider with any negative side effects Advised to hold Actos at visit on 06/16/22 due to LE edema Patient will continue to monitor and record blood sugar daily and as needed with glucometer, and will call PCP with any readings outside of recommended range Patient will take blood sugar log and meter to provider visits for review Patient will follow a modified carbohydrate diet and decrease simple carbohydrates and sugars Patient will increase activity level as tolerated with an ultimate goal of at least 150 minutes of exercise per week Patient will check feet daily for sores, wounds, calluses, etc and will notify provider of any abnormal findings Patient will have yearly eye exams to check for or monitor diabetic retinopathy Patient will reach out to RN Care Coordinator [phone] with any care coordination or resource needs         SDOH assessments and interventions completed:  Yes SDOH Interventions Today    Flowsheet Row Most Recent Value  SDOH Interventions   Food Insecurity Interventions Intervention Not Indicated  Transportation Interventions Patient Resources (Friends/Family)        Care Coordination Interventions:  Yes, provided   Interventions Today    Flowsheet Row Most Recent Value  Chronic Disease   Chronic disease during today's visit Diabetes  General Interventions   General Interventions Discussed/Reviewed General Interventions Discussed, General Interventions Reviewed, Labs, Durable Medical Equipment (DME), Doctor Visits, Communication with  Labs Hgb A1c every 3 months, Kidney Function  Doctor Visits Discussed/Reviewed Doctor Visits Discussed, Doctor Visits Reviewed, PCP  Durable Medical Equipment (DME) Glucomoter  PCP/Specialist Visits Compliance with follow-up visit  Communication with PCP/Specialists  [facility] Message sent to Dr Caren Macadam nurse re: need to schedule 3 month f/u and repeat labs per lab result note from 5/13. Echo is pending schedluing.]  Exercise Interventions   Exercise Discussed/Reviewed Physical Activity  Physical Activity Discussed/Reviewed Physical Activity Discussed, Physical Activity Reviewed  Education Interventions   Education Provided Provided Printed Education, Provided Education  Provided Verbal Education On Labs  Labs Reviewed Hgb A1c  [A1C decreased from 9.8 to 7.0 on 06/16/22]  Nutrition Interventions   Nutrition Discussed/Reviewed Nutrition Discussed, Nutrition Reviewed, Carbohydrate meal planning, Portion sizes, Decreasing sugar intake  Pharmacy Interventions   Pharmacy Dicussed/Reviewed Pharmacy Topics Discussed, Pharmacy Topics Reviewed, Medications and their functions  [Holding Actos due to lower exremity edema]       Follow up plan: Follow up call scheduled for 09/23/22    Encounter Outcome:  Pt. Visit Completed   Demetrios Loll, BSN, RN-BC RN Care Coordinator [facility]  [facility] Direct Dial: [phone] Main #: [phone]

## 2022-06-27 ENCOUNTER — Ambulatory Visit (INDEPENDENT_AMBULATORY_CARE_PROVIDER_SITE_OTHER): Payer: Medicare Other

## 2022-06-27 DIAGNOSIS — I495 Sick sinus syndrome: Secondary | ICD-10-CM | POA: Diagnosis not present

## 2022-06-27 LAB — CUP PACEART REMOTE DEVICE CHECK
Battery Remaining Longevity: 143 mo
Battery Voltage: 3.02 V
Brady Statistic AP VP Percent: 0.05 %
Brady Statistic AP VS Percent: 57.9 %
Brady Statistic AS VP Percent: 0.06 %
Brady Statistic AS VS Percent: 41.99 %
Brady Statistic RA Percent Paced: 58.44 %
Brady Statistic RV Percent Paced: 0.11 %
Date Time Interrogation Session: 20240523213958
Implantable Pulse Generator Implant Date: 20220420
Lead Channel Impedance Value: 304 Ohm
Lead Channel Impedance Value: 342 Ohm
Lead Channel Impedance Value: 513 Ohm
Lead Channel Impedance Value: 570 Ohm
Lead Channel Pacing Threshold Amplitude: 0.5 V
Lead Channel Pacing Threshold Amplitude: 1.375 V
Lead Channel Pacing Threshold Pulse Width: 0.4 ms
Lead Channel Pacing Threshold Pulse Width: 0.4 ms
Lead Channel Sensing Intrinsic Amplitude: 2 mV
Lead Channel Sensing Intrinsic Amplitude: 2 mV
Lead Channel Sensing Intrinsic Amplitude: 7.625 mV
Lead Channel Sensing Intrinsic Amplitude: 7.625 mV
Lead Channel Setting Pacing Amplitude: 1.5 V
Lead Channel Setting Pacing Amplitude: 2.75 V
Lead Channel Setting Pacing Pulse Width: 0.4 ms
Lead Channel Setting Sensing Sensitivity: 1.2 mV
Zone Setting Status: 755011
Zone Setting Status: 755011

## 2022-07-01 ENCOUNTER — Other Ambulatory Visit: Payer: Self-pay

## 2022-07-01 ENCOUNTER — Telehealth: Payer: Self-pay

## 2022-07-01 DIAGNOSIS — I480 Paroxysmal atrial fibrillation: Secondary | ICD-10-CM

## 2022-07-01 DIAGNOSIS — R7989 Other specified abnormal findings of blood chemistry: Secondary | ICD-10-CM

## 2022-07-01 DIAGNOSIS — I10 Essential (primary) hypertension: Secondary | ICD-10-CM

## 2022-07-01 DIAGNOSIS — R0602 Shortness of breath: Secondary | ICD-10-CM

## 2022-07-01 MED ORDER — FUROSEMIDE 20 MG PO TABS
20.0000 mg | ORAL_TABLET | Freq: Every day | ORAL | 3 refills | Status: DC
Start: 2022-07-01 — End: 2022-10-17

## 2022-07-01 NOTE — Telephone Encounter (Signed)
Pt's daughter called in wanting to speak with nurse about pt's fluid meds. Pt's daughter states that pt's feet are still slightly swollen and sore to touch. Please advise.  Cb#: [phone]

## 2022-07-09 ENCOUNTER — Telehealth: Payer: Self-pay | Admitting: Pharmacist

## 2022-07-09 NOTE — Progress Notes (Signed)
Care Management & Coordination Services Pharmacy Team   Reason for Encounter: Medication coordination and delivery   Contacted patient to discuss medications and coordinate delivery from [facility]. Spoke with patient on 07/09/2022  Cycle dispensing form sent to Erskine Emery, CPP for review.   Last adherence delivery date: 06/23/22      Patient is due for next adherence delivery on: 07/22/22  This delivery to include: Adherence Packaging  30 Days   Atorvastatin                           40 mg  Bupropion XL                        150 mg  Donepezil                               5 mg  Folic Acid                               1 mg  Lisinopril                                2.5 mg  Metoprolol                              50 mg Omeprazole                           20 mg  Metformin                              500mg  Terazosin                               5 mg  Venlafaxine ER                      75 mg    Asteline Nasal spray             0.10% Furosemide    40mg  daily  Easy Max testing strips   Patient declined the following medications this month: None  No refill request needed.  Confirmed delivery date of 07/22/22, advised patient that pharmacy will contact them the morning of delivery.   Any concerns about your medications? No  How often do you forget or accidentally miss a dose? Rarely  Do you use a pillbox? No  Is patient in packaging Yes  If yes  What is the date on your next pill pack?  Any concerns or issues with your packaging?    Medications: Outpatient Encounter Medications as of 07/09/2022  Medication Sig   furosemide (LASIX) 20 MG tablet Take 1 tablet (20 mg total) by mouth daily.   aspirin 81 MG tablet Take 81 mg by mouth daily.   atorvastatin (LIPITOR) 40 MG tablet Take 1 tablet (40 mg total) by mouth daily.   azelastine (ASTELIN) 0.1 % nasal spray Place 2 sprays into both nostrils 2 (two) times daily. Use in each nostril as directed   buPROPion  (WELLBUTRIN XL) 150 MG 24 hr tablet  Take 1 tablet (150 mg total) by mouth daily.   cyanocobalamin (VITAMIN B12) 1000 MCG tablet Take 1 tablet (1,000 mcg total) by mouth daily.   donepezil (ARICEPT) 5 MG tablet TAKE 1 (ONE) TABLET BY MOUTH AT BEDTIME.   folic acid (FOLVITE) 1 MG tablet TAKE (1) TABLET BY MOUTH ONCE DAILY.   lisinopril (ZESTRIL) 2.5 MG tablet Take 1 tablet (2.5 mg total) by mouth daily.   metFORMIN (GLUCOPHAGE) 500 MG tablet Take 2 tablets (1,000 mg total) by mouth 2 (two) times daily with a meal.   metoprolol tartrate (LOPRESSOR) 50 MG tablet TAKE (1) TABLET BY MOUTH TWICE DAILY.   nitroGLYCERIN (NITROSTAT) 0.4 MG SL tablet Place 1 tablet (0.4 mg total) under the tongue every 5 (five) minutes x 3 doses as needed for chest pain.   omeprazole (PRILOSEC) 20 MG capsule Take 1 capsule (20 mg total) by mouth 2 (two) times daily.   pioglitazone (ACTOS) 30 MG tablet Take 1 tablet (30 mg total) by mouth daily.   sitaGLIPtin (JANUVIA) 100 MG tablet Take 1 tablet (100 mg total) by mouth daily.   terazosin (HYTRIN) 5 MG capsule Take 1 capsule (5 mg total) by mouth every evening.   venlafaxine XR (EFFEXOR-XR) 75 MG 24 hr capsule Take 1 capsule (75 mg total) by mouth daily.   No facility-administered encounter medications on file as of 07/09/2022.   BP Readings from Last 3 Encounters:  06/16/22 (!) 160/72  05/26/22 120/68  03/17/22 130/68    Pulse Readings from Last 3 Encounters:  06/16/22 60  05/26/22 67  03/17/22 72    Lab Results  Component Value Date/Time   HGBA1C 7.0 (H) 06/16/2022 03:59 PM   HGBA1C 9.8 (H) 03/10/2022 07:58 AM   Lab Results  Component Value Date   CREATININE 0.74 06/16/2022   BUN 18 06/16/2022   GFRNONAA 83 06/15/2020   GFRAA 96 06/15/2020   NA 137 06/16/2022   K 4.3 06/16/2022   CALCIUM 9.8 06/16/2022   CO2 25 06/16/2022     Future Appointments  Date Time Provider Department Center  08/21/2022 12:00 PM Erroll Luna, [facility] None  09/23/2022   3:00 PM Gwenith Daily, RN [facility] None  09/26/2022  7:25 AM [facility] DEVICE REMOTES [facility]  12/26/2022  7:25 AM [facility] DEVICE REMOTES [facility]     [employer], [facility]

## 2022-07-11 ENCOUNTER — Telehealth: Payer: Self-pay | Admitting: Family Medicine

## 2022-07-11 NOTE — Telephone Encounter (Signed)
Prescription Request  07/11/2022  LOV: 06/16/2022  What is the name of the medication or equipment?   LANCETS TEST STRIPS **Margaret unsure if patient will need a different meter**  Have you contacted your pharmacy to request a refill? Yes   Which pharmacy would you like this sent to?  [facility] - [place], [place] - [address] [place] [address] Phone: [phone] Fax: [phone]    Patient notified that their request is being sent to the clinical staff for review and that they should receive a response within 2 business days.   Please advise when refills sent at [phone]

## 2022-07-14 ENCOUNTER — Other Ambulatory Visit: Payer: Self-pay

## 2022-07-14 DIAGNOSIS — E1149 Type 2 diabetes mellitus with other diabetic neurological complication: Secondary | ICD-10-CM

## 2022-07-14 MED ORDER — BLOOD GLUCOSE TEST VI STRP
1.0000 | ORAL_STRIP | Freq: Three times a day (TID) | 0 refills | Status: DC
Start: 2022-07-14 — End: 2022-07-18

## 2022-07-14 MED ORDER — BLOOD GLUCOSE MONITORING SUPPL DEVI
1.0000 | Freq: Three times a day (TID) | 0 refills | Status: DC
Start: 2022-07-14 — End: 2022-07-18

## 2022-07-14 MED ORDER — LANCET DEVICE MISC
1.0000 | Freq: Three times a day (TID) | 0 refills | Status: DC
Start: 2022-07-14 — End: 2022-07-18

## 2022-07-14 MED ORDER — LANCETS MISC. MISC
1.0000 | Freq: Three times a day (TID) | 1 refills | Status: DC
Start: 2022-07-14 — End: 2022-07-18

## 2022-07-15 NOTE — Progress Notes (Signed)
Remote pacemaker transmission.   

## 2022-07-18 ENCOUNTER — Other Ambulatory Visit: Payer: Self-pay

## 2022-07-18 DIAGNOSIS — E1149 Type 2 diabetes mellitus with other diabetic neurological complication: Secondary | ICD-10-CM

## 2022-07-18 MED ORDER — LANCET DEVICE MISC
1.0000 | Freq: Three times a day (TID) | 0 refills | Status: AC
Start: 2022-07-18 — End: 2022-08-17

## 2022-07-18 MED ORDER — BLOOD GLUCOSE TEST VI STRP
1.0000 | ORAL_STRIP | Freq: Three times a day (TID) | 0 refills | Status: AC
Start: 2022-07-18 — End: 2022-08-20

## 2022-07-18 MED ORDER — BLOOD GLUCOSE MONITORING SUPPL DEVI
1.0000 | Freq: Three times a day (TID) | 0 refills | Status: DC
Start: 2022-07-18 — End: 2023-10-22

## 2022-07-18 MED ORDER — LANCETS MISC. MISC
1.0000 | Freq: Three times a day (TID) | 1 refills | Status: AC
Start: 2022-07-18 — End: 2022-08-17

## 2022-07-25 ENCOUNTER — Ambulatory Visit: Payer: Self-pay | Admitting: *Deleted

## 2022-07-25 NOTE — Patient Instructions (Signed)
Visit Information  Thank you for taking time to visit with me today. Please don't hesitate to contact me if I can be of assistance to you.   Following are the goals we discussed today:   Goals Addressed             This Visit's Progress    COMPLETED: [facility] Care coordination RN CM services   Not on track    Interventions Today    Flowsheet Row Most Recent Value  Chronic Disease   Chronic disease during today's visit Other  General Interventions   General Interventions Discussed/Reviewed Communication with, General Interventions Discussed  Communication with Pharmacists  Pharmacy Interventions   Pharmacy Dicussed/Reviewed Pharmacy Topics Discussed, Affording Medications, Referral to Pharmacist  Referral to Pharmacist Cannot afford medications  [EMAIL TO [facility]ADERSHIP + [facility]ICINE MANAGEMENT CONCERN AFTER PCP PHARMACY SERVICES CHANGED]       Patient to be assisted by office or [facility] pharmacy/leaderships staff         Our next appointment is  n/a  on n/a at n/a  Please call the care guide team at [phone] if you need to cancel or reschedule your appointment.   If you are experiencing a Mental Health or Behavioral Health Crisis or need someone to talk to, please call the Suicide and Crisis Lifeline: 988 call the [place] National Suicide Prevention Lifeline: (671) 586-5912 or TTY: 610-555-4683 TTY 639-382-4278) to talk to a trained counselor call 1-800-273-TALK (toll free, 24 hour hotline) call the [facility]: (405) [phone] call 911   N/a  No further follow up required: n/a  Jolly Bleicher L. Noelle Penner, RN, BSN, CCM [facility] Care Management Community Coordinator Office number [phone]

## 2022-07-25 NOTE — Patient Outreach (Signed)
  Care Coordination   COLLABORATION WITH PCP STAFF/[facility] LEADERSHIP  Visit Note   07/25/2022 LATE ENTRY FOR 07/21/22 Name: [name] [name] MRN: [id number] DOB: December 17, 1940  [name] [name] is a 82 y.o. year old male who sees Pickard, Priscille Heidelberg, MD for primary care. I   relayed a message related to voiced pcp staff concern to [facility] leadership staff   What matters to the patients health and wellness today?   Pcp staff reported patient called in to pcp office with medication management concern (out of medicine, need medicine assistance) and inquiring of assistance after changes with the pcp pharmacy staff services ([facility]) Pcp office will attempt to offer samples if possible   Patient can continue to outreach to [facility] vendor if patient prefers continued services     Goals Addressed             This Visit's Progress    COMPLETED: [facility] Care coordination RN CM services   Not on track    Interventions Today    Flowsheet Row Most Recent Value  Chronic Disease   Chronic disease during today's visit Other  General Interventions   General Interventions Discussed/Reviewed Communication with, General Interventions Discussed  Communication with Pharmacists  Pharmacy Interventions   Pharmacy Dicussed/Reviewed Pharmacy Topics Discussed, Affording Medications, Referral to Pharmacist  Referral to Pharmacist Cannot afford medications  [EMAIL TO [facility] LEADERSHIP + [facility] MEDICINE MANAGEMENT CONCERN AFTER PCP PHARMACY SERVICES CHANGED]       Patient to be assisted by office or [facility] pharmacy/leaderships staff         SDOH assessments and interventions completed:  No     Care Coordination Interventions:  Yes, provided   Follow up plan: No further intervention required.   Encounter Outcome:  Pt. Visit Completed   [name] L. Noelle Penner, RN, BSN, CCM [facility] Care Management Community Coordinator Office number [phone]

## 2022-08-06 ENCOUNTER — Other Ambulatory Visit: Payer: Self-pay | Admitting: Family Medicine

## 2022-08-14 ENCOUNTER — Other Ambulatory Visit: Payer: Self-pay | Admitting: Family Medicine

## 2022-08-19 ENCOUNTER — Other Ambulatory Visit: Payer: Self-pay | Admitting: Family Medicine

## 2022-08-21 ENCOUNTER — Encounter: Payer: Medicare Other | Admitting: Pharmacist

## 2022-09-23 ENCOUNTER — Ambulatory Visit: Payer: Self-pay | Admitting: *Deleted

## 2022-09-23 ENCOUNTER — Encounter: Payer: Self-pay | Admitting: *Deleted

## 2022-09-23 NOTE — Patient Outreach (Signed)
Care Coordination   Health Equity Plan Follow Up Visit Note   09/23/2022 Name: [name] [name] MRN: [id number] DOB: [birth year]  [name] [name] is a 82 y.o. year old male who sees Pickard, Priscille Heidelberg, MD for primary care. I  spoke with daughter, [name] [name], by telephone today.   What matters to the patients health and wellness today?  Managing blood sugar    Goals Addressed             This Visit's Progress    Patient will decrease A1C by 5% over the next 6 months   On track    Care Coordination Goals: Patient/sister will schedule 3 month follow-up with PCP for diabetes management with A1C Patient will eat 3 meals per day with 30 GM of carbohydrates and up to 2 snacks per day with less than 15 GM of carbohydrates Patient will check and record blood sugar twice daily and will have log available to discuss with RN Care Coordinator at next telephone visit Patient will increase activity level as tolerated with an ultimate goal of at least 150 minutes of exercise per week Patient will reach out to RN Care Coordinator [phone] with any care coordination or resource needs         SDOH assessments and interventions completed:  Yes  SDOH Interventions Today    Flowsheet Row Most Recent Value  SDOH Interventions   Food Insecurity Interventions Intervention Not Indicated  Housing Interventions Intervention Not Indicated  Transportation Interventions Patient Resources (Friends/Family)  [daughter, [name] [name], takes him to appts. He drives to grocery store.]  Utilities Interventions Intervention Not Indicated  Financial Strain Interventions Intervention Not Indicated  Physical Activity Interventions Patient Declined  Health Literacy Interventions Intervention Not Indicated        Care Coordination Interventions:  Yes, provided  Interventions Today    Flowsheet Row Most Recent Value  Chronic Disease   Chronic disease during today's visit Diabetes  General Interventions   General  Interventions Discussed/Reviewed General Interventions Discussed, General Interventions Reviewed, Durable Medical Equipment (DME), Labs, Doctor Visits, Communication with  Labs Hgb A1c every 3 months  Doctor Visits Discussed/Reviewed Doctor Visits Discussed, Doctor Visits Reviewed, PCP  Durable Medical Equipment (DME) Glucomoter  [Blood sugar averaging 120-180. None below 70 or over 200. Daughter did not have a recent reading available.]  PCP/Specialist Visits Compliance with follow-up visit  [Schedule 3 month F/U with PCP.]  Communication with PCP/Specialists  [Staff message sent to PCP office requesting that they reach out to patient to schedule a 3 month F/U Appt. & to clarify with daughter if he is to be taking Actos or still holding.]  Exercise Interventions   Exercise Discussed/Reviewed Exercise Discussed, Exercise Reviewed, Physical Activity  [Patient is able to perform ADLs independently and does not use assistive devices]  Physical Activity Discussed/Reviewed Physical Activity Reviewed, Physical Activity Discussed, Home Exercise Program (HEP)  [encouraged increased activity level as tolerated with a goal of 150 minutes per week. Has home exercise equipment (light had held weights and pedal bike) but has not been using the recently.]  Education Interventions   Education Provided Provided Education, Provided Printed Education  [Printed education on Diabetes nutrition and exercise. Emailed information on pharmacy options to [email or web address]]  Provided Verbal Education On Nutrition, When to see the doctor, Medication, Exercise, Blood Sugar Monitoring, Labs  Labs Reviewed Hgb A1c, Kidney Function  [A1C down to 7.0% on 06/16/22 from 9.8% on 03/10/22. Creatinine 0.74, eGFR 90]  Nutrition Interventions   Nutrition Discussed/Reviewed Nutrition Discussed,  Nutrition Reviewed, Carbohydrate meal planning, Portion sizes, Increasing proteins, Decreasing sugar intake, Decreasing fats, Adding fruits and  vegetables, Fluid intake  [follow a carb modified diet. 3 meals per day with 30 GM of CHO and up to 2 snacks per day with less than 15 GM of CHO]  Pharmacy Interventions   Pharmacy Dicussed/Reviewed Pharmacy Topics Discussed, Pharmacy Topics Reviewed, Medications and their functions  [Provided daughter a list of pharmacy alternatives to [facility] via personal email since they are closing. Patient receives prepackaged meds from them. Does not think Actos is inlcuded but will double check.]  Safety Interventions   Safety Discussed/Reviewed Safety Discussed, Safety Reviewed, Fall Risk, Home Safety  [has some difficulty with balance but does not want to use a cane or walker. did have a mechanical fall about a month ago without any injury.]  Home Safety Assistive Devices  [Cane would be beneficial for balance]       Follow up plan: Follow up call scheduled for 10/10/22    Encounter Outcome:  Pt. Visit Completed   Demetrios Loll, BSN, RN-BC RN Care Coordinator [facility]  [facility] Direct Dial: [phone] Main #: [phone]

## 2022-09-26 ENCOUNTER — Ambulatory Visit: Payer: Medicare Other

## 2022-09-29 ENCOUNTER — Encounter: Payer: Self-pay | Admitting: *Deleted

## 2022-09-29 NOTE — Patient Outreach (Signed)
  Care Coordination   Documentation  Visit Note   09/29/2022 Name: [name] [name] MRN: [id number] DOB: 1941-01-11  [name] [name] is a 82 y.o. year old male who sees Pickard, Priscille Heidelberg, MD for primary care. I  collaborated with Venia Carbon, LPN with [facility] by in-basket today to follow-up on need for 3 month F/U appointment with PCP in August that I discussed with the patient's daughter during our telephone visit on 09/23/22.  What matters to the patients health and wellness today?  Did not speak with patient or caregiver today   SDOH assessments and interventions completed:  No   Care Coordination Interventions:  Yes, provided  Interventions Today    Flowsheet Row Most Recent Value  General Interventions   General Interventions Discussed/Reviewed Communication with  Communication with PCP/Specialists  [Collaborated with PCP's nurse regarding need for 3 month follow-up in August. Received in-basket message today that patient has been scheduled for a F/U tomorrow and daughter was instructed to bring all medications for review. Thanked nurse for her help]      Follow up plan: Follow up call scheduled for 10/10/22    Encounter Outcome:  Pt. Visit Completed   Demetrios Loll, BSN, RN-BC RN Care Coordinator [facility]  [facility] Direct Dial: [phone] Main #: [phone]

## 2022-09-30 ENCOUNTER — Ambulatory Visit (INDEPENDENT_AMBULATORY_CARE_PROVIDER_SITE_OTHER): Payer: Medicare Other | Admitting: Family Medicine

## 2022-09-30 VITALS — BP 146/78 | HR 62 | Temp 97.7°F | Ht 70.0 in | Wt 172.4 lb

## 2022-09-30 DIAGNOSIS — E1149 Type 2 diabetes mellitus with other diabetic neurological complication: Secondary | ICD-10-CM

## 2022-09-30 DIAGNOSIS — F0150 Vascular dementia without behavioral disturbance: Secondary | ICD-10-CM | POA: Diagnosis not present

## 2022-09-30 DIAGNOSIS — I480 Paroxysmal atrial fibrillation: Secondary | ICD-10-CM

## 2022-09-30 DIAGNOSIS — Z9861 Coronary angioplasty status: Secondary | ICD-10-CM

## 2022-09-30 DIAGNOSIS — Z7984 Long term (current) use of oral hypoglycemic drugs: Secondary | ICD-10-CM | POA: Diagnosis not present

## 2022-09-30 DIAGNOSIS — I2510 Atherosclerotic heart disease of native coronary artery without angina pectoris: Secondary | ICD-10-CM
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
## 2022-10-01 LAB — CBC WITH DIFFERENTIAL/PLATELET
Absolute Monocytes: 595 {cells}/uL (ref 200–950)
Basophils Absolute: 49 cells/uL (ref 0–200)
Basophils Relative: 0.7 %
Eosinophils Absolute: 210 {cells}/uL (ref 15–500)
Eosinophils Relative: 3 %
HCT: 34.9 % — ABNORMAL LOW (ref 38.5–50.0)
Hemoglobin: 11.8 g/dL — ABNORMAL LOW (ref 13.2–17.1)
Lymphs Abs: 1344 cells/uL (ref 850–3900)
MCH: 30.7 pg (ref 27.0–33.0)
MCHC: 33.8 g/dL (ref 32.0–36.0)
MCV: 90.9 fL (ref 80.0–100.0)
MPV: 12.8 fL — ABNORMAL HIGH (ref 7.5–12.5)
Monocytes Relative: 8.5 %
Neutro Abs: 4802 cells/uL (ref 1500–7800)
Neutrophils Relative %: 68.6 %
Platelets: 123 10*3/uL — ABNORMAL LOW (ref 140–400)
RBC: 3.84 10*6/uL — ABNORMAL LOW (ref 4.20–5.80)
RDW: 13 % (ref 11.0–15.0)
Total Lymphocyte: 19.2 %
WBC: 7 10*3/uL (ref 3.8–10.8)

## 2022-10-01 LAB — COMPLETE METABOLIC PANEL WITH GFR
AG Ratio: 1.4 (calc) (ref 1.0–2.5)
ALT: 13 U/L (ref 9–46)
AST: 17 U/L (ref 10–35)
Albumin: 4.2 g/dL (ref 3.6–5.1)
Alkaline phosphatase (APISO): 77 U/L (ref 35–144)
BUN: 15 mg/dL (ref 7–25)
CO2: 26 mmol/L (ref 20–32)
Calcium: 9.7 mg/dL (ref 8.6–10.3)
Chloride: 100 mmol/L (ref 98–110)
Creat: 0.88 mg/dL (ref 0.70–1.22)
Globulin: 3 g/dL (ref 1.9–3.7)
Glucose, Bld: 100 mg/dL — ABNORMAL HIGH (ref 65–99)
Potassium: 4.5 mmol/L (ref 3.5–5.3)
Sodium: 136 mmol/L (ref 135–146)
Total Bilirubin: 0.4 mg/dL (ref 0.2–1.2)
Total Protein: 7.2 g/dL (ref 6.1–8.1)
eGFR: 86 mL/min/{1.73_m2} (ref >=60)

## 2022-10-01 LAB — LIPID PANEL
Cholesterol: 99 mg/dL (ref <200)
HDL: 42 mg/dL (ref >=40)
LDL Cholesterol (Calc): 39 mg/dL
Non-HDL Cholesterol (Calc): 57 mg/dL (ref <130)
Total CHOL/HDL Ratio: 2.4 (calc) (ref <5.0)
Triglycerides: 94 mg/dL (ref <150)

## 2022-10-01 LAB — CUP PACEART REMOTE DEVICE CHECK
Battery Remaining Longevity: 141 mo
Battery Voltage: 3.02 V
Brady Statistic AP VP Percent: 0.04 %
Brady Statistic AP VS Percent: 65.64 %
Brady Statistic AS VP Percent: 0.03 %
Brady Statistic AS VS Percent: 34.29 %
Brady Statistic RA Percent Paced: 66.31 %
Brady Statistic RV Percent Paced: 0.07 %
Date Time Interrogation Session: 20240827160318
Implantable Pulse Generator Implant Date: 20220420
Lead Channel Impedance Value: 361 Ohm
Lead Channel Impedance Value: 380 Ohm
Lead Channel Impedance Value: 570 Ohm
Lead Channel Impedance Value: 608 Ohm
Lead Channel Pacing Threshold Amplitude: 0.5 V
Lead Channel Pacing Threshold Amplitude: 1.375 V
Lead Channel Pacing Threshold Pulse Width: 0.4 ms
Lead Channel Pacing Threshold Pulse Width: 0.4 ms
Lead Channel Sensing Intrinsic Amplitude: 2.375 mV
Lead Channel Sensing Intrinsic Amplitude: 2.375 mV
Lead Channel Sensing Intrinsic Amplitude: 8.375 mV
Lead Channel Sensing Intrinsic Amplitude: 8.375 mV
Lead Channel Setting Pacing Amplitude: 1.5 V
Lead Channel Setting Pacing Amplitude: 2.75 V
Lead Channel Setting Pacing Pulse Width: 0.4 ms
Lead Channel Setting Sensing Sensitivity: 1.2 mV
Zone Setting Status: 755011
Zone Setting Status: 755011

## 2022-10-01 LAB — PROTEIN / CREATININE RATIO, URINE
Creatinine, Urine: 75 mg/dL (ref 20–320)
Protein/Creat Ratio: 80 mg/g{creat} (ref 25–148)
Protein/Creatinine Ratio: 0.08 mg/mg{creat} (ref 0.025–0.148)
Total Protein, Urine: 6 mg/dL (ref 5–25)

## 2022-10-01 LAB — HEMOGLOBIN A1C
Hgb A1c MFr Bld: 6.8 %{Hb} — ABNORMAL HIGH (ref <5.7)
Mean Plasma Glucose: 148 mg/dL
eAG (mmol/L): 8.2 mmol/L

## 2022-10-10 ENCOUNTER — Encounter: Payer: Self-pay | Admitting: *Deleted

## 2022-10-10 ENCOUNTER — Ambulatory Visit: Payer: Self-pay | Admitting: *Deleted

## 2022-10-10 NOTE — Patient Outreach (Signed)
Care Coordination   Health Equity Plan Follow Up Visit Note   10/10/2022 Name: [name] [name] MRN: [id number] DOB: Nov 27, 1940  Kelsy Murrow is a 82 y.o. year old male who sees Pickard, Priscille Heidelberg, MD for primary care. I  spoke with daughter, [name], by telephone today.   What matters to the patients health and wellness today? Continuing to lower A1C    Goals Addressed             This Visit's Progress    Patient will decrease A1C by 5% over the next 6 months   On track    Care Coordination Goals: Patient/daughter will schedule 3 month follow-up with PCP for diabetes management with A1C Patient will eat 3 meals per day with 30 grams of carbohydrates and up to 2 snacks per day with less than 15 grams of carbohydrates Patient will check and record blood sugar twice daily and will have log available to discuss with RN Care Coordinator at next telephone visit Patient will increase activity level as tolerated with an ultimate goal of at least 150 minutes of exercise per week Patient will reach out to RN Care Coordinator [phone] with any care coordination or resource needs      SDOH assessments and interventions completed:  Yes Interventions Today    Flowsheet Row Most Recent Value  Chronic Disease   Chronic disease during today's visit Diabetes  General Interventions   General Interventions Discussed/Reviewed Communication with  Labs Hgb A1c every 3 months  Doctor Visits Discussed/Reviewed Doctor Visits Discussed, Doctor Visits Reviewed, PCP  Durable Medical Equipment (DME) Glucomoter  [Daughter did not have blood sugar readings available but has been less than 160.]  PCP/Specialist Visits Compliance with follow-up visit  [Schedule a 3 month follow-up with Dr Pickard]  Exercise Interventions   Exercise Discussed/Reviewed Physical Activity, Exercise Discussed, Exercise Reviewed  [Patient is able to perform ADLs independently and does not use assistive devices]  Physical  Activity Discussed/Reviewed Physical Activity Discussed, Physical Activity Reviewed  [encouraged increased activity level as tolerated with a goal of 150 minutes per week. Has home exercise equipment (light had held weights and pedal bike)]  Education Interventions   Education Provided Provided Education  Provided Verbal Education On Nutrition, When to see the doctor, Blood Sugar Monitoring, Labs, Medication, Exercise  Labs Reviewed --  [09/30/22 A1C 6.8%. Down from 7.0% on 06/16/22. Provided encouragement.]  Nutrition Interventions   Nutrition Discussed/Reviewed Nutrition Discussed, Nutrition Reviewed, Portion sizes, Carbohydrate meal planning, Decreasing sugar intake, Fluid intake  [continue to limit sugar and simple carbs. Encouraged to eat 3 meals a day with 30 GM of CHO and up to 2 snacks per day, if needed, with less than 15 GM of CHO.]  Pharmacy Interventions   Pharmacy Dicussed/Reviewed Pharmacy Topics Discussed, Pharmacy Topics Reviewed, Medications and their functions  [Taking medications as prescribed without any concerns]  Safety Interventions   Safety Discussed/Reviewed Safety Discussed, Safety Reviewed, Fall Risk, Home Safety  [No falls since last telephone call]  Home Safety Assistive Devices  [encouraged use of cane for balance]       Care Coordination Interventions:  Yes, provided  Interventions Today    Flowsheet Row Most Recent Value  Chronic Disease   Chronic disease during today's visit Diabetes  General Interventions   General Interventions Discussed/Reviewed Communication with  Labs Hgb A1c every 3 months  Doctor Visits Discussed/Reviewed Doctor Visits Discussed, Doctor Visits Reviewed, PCP  Durable Medical Equipment (DME) Glucomoter  [Daughter did not have blood  sugar readings available but has been less than 160.]  PCP/Specialist Visits Compliance with follow-up visit  [Schedule a 3 month follow-up with Dr Pickard]  Exercise Interventions   Exercise  Discussed/Reviewed Physical Activity, Exercise Discussed, Exercise Reviewed  [Patient is able to perform ADLs independently and does not use assistive devices]  Physical Activity Discussed/Reviewed Physical Activity Discussed, Physical Activity Reviewed  [encouraged increased activity level as tolerated with a goal of 150 minutes per week. Has home exercise equipment (light had held weights and pedal bike)]  Education Interventions   Education Provided Provided Education  Provided Verbal Education On Nutrition, When to see the doctor, Blood Sugar Monitoring, Labs, Medication, Exercise  Labs Reviewed --  [09/30/22 A1C 6.8%. Down from 7.0% on 06/16/22. Provided encouragement.]  Nutrition Interventions   Nutrition Discussed/Reviewed Nutrition Discussed, Nutrition Reviewed, Portion sizes, Carbohydrate meal planning, Decreasing sugar intake, Fluid intake  [continue to limit sugar and simple carbs. Encouraged to eat 3 meals a day with 30 GM of CHO and up to 2 snacks per day, if needed, with less than 15 GM of CHO.]  Pharmacy Interventions   Pharmacy Dicussed/Reviewed Pharmacy Topics Discussed, Pharmacy Topics Reviewed, Medications and their functions  [Taking medications as prescribed without any concerns]  Safety Interventions   Safety Discussed/Reviewed Safety Discussed, Safety Reviewed, Fall Risk, Home Safety  [No falls since last telephone call]  Home Safety Assistive Devices  [encouraged use of cane for balance]       Follow up plan: Follow up call scheduled for 12/23/22    Encounter Outcome:  Patient Visit Completed   Demetrios Loll, RN, BSN Care Management Coordinator [facility]  [facility] Direct Dial: [phone] Main #: [phone]

## 2022-10-13 ENCOUNTER — Other Ambulatory Visit: Payer: Self-pay | Admitting: Family Medicine

## 2022-10-13 ENCOUNTER — Ambulatory Visit (HOSPITAL_COMMUNITY)
Admission: RE | Admit: 2022-10-13 | Discharge: 2022-10-13 | Disposition: A | Discharge status: Home / Self Care | Payer: Medicare Other | Source: Ambulatory Visit | Attending: Family Medicine | Admitting: Family Medicine

## 2022-10-13 DIAGNOSIS — I509 Heart failure, unspecified: Secondary | ICD-10-CM | POA: Diagnosis present

## 2022-10-13 DIAGNOSIS — R7989 Other specified abnormal findings of blood chemistry: Secondary | ICD-10-CM | POA: Diagnosis present

## 2022-10-13 DIAGNOSIS — I10 Essential (primary) hypertension: Secondary | ICD-10-CM

## 2022-10-13 DIAGNOSIS — B182 Chronic viral hepatitis C: Secondary | ICD-10-CM

## 2022-10-13 DIAGNOSIS — R0602 Shortness of breath: Secondary | ICD-10-CM

## 2022-10-13 DIAGNOSIS — E0800 Diabetes mellitus due to underlying condition with hyperosmolarity without nonketotic hyperglycemic-hyperosmolar coma (NKHHC): Secondary | ICD-10-CM

## 2022-10-13 LAB — ECHOCARDIOGRAM COMPLETE
AR max vel: 3.13 cm2
AV Area VTI: 3.02 cm2
AV Area mean vel: 2.83 cm2
AV Mean grad: 2.1 mmHg
AV Peak grad: 3.7 mmHg
Ao pk vel: 0.96 m/s
Area-P 1/2: 2.8 cm2
S' Lateral: 2.6 cm

## 2022-10-13 NOTE — Progress Notes (Signed)
*  PRELIMINARY RESULTS* Echocardiogram 2D Echocardiogram has been performed.  Stacey Drain 10/13/2022, 10:20 AM

## 2022-10-14 NOTE — Telephone Encounter (Signed)
Requested medication (s) are due for refill today: Yes  Requested medication (s) are on the active medication list: Yes  Last refill:  05/12/22  Future visit scheduled: No  Notes to clinic:  Pharmacy requests 1 year supply.    Requested Prescriptions  Pending Prescriptions Disp Refills   JANUVIA 100 MG tablet [Pharmacy Med Name: JANUVIA 100 MG TABLET 100 Tablet] 19 tablet 10    Sig: TAKE 1 TABLET BY MOUTH ONCE DAILY     Endocrinology:  Diabetes - DPP-4 Inhibitors Failed - 10/13/2022  8:41 AM      Failed - Valid encounter within last 6 months    Recent Outpatient Visits           2 years ago Controlled type 2 diabetes mellitus with complication, without long-term current use of insulin (HCC)   [facility] Pickard, Priscille Heidelberg, MD   2 years ago Rash and nonspecific skin eruption   [facility] Valentino Nose, NP   2 years ago Vitamin B12 deficiency   [facility] Pickard, Priscille Heidelberg, MD   2 years ago Anemia, unspecified type   [facility] Donita Brooks, MD   3 years ago Weight loss, unintentional   [facility] Pickard, Priscille Heidelberg, MD              Passed - HBA1C is between 0 and 7.9 and within 180 days    Hgb A1c MFr Bld  Date Value Ref Range Status  09/30/2022 6.8 (H) <5.7 % of total Hgb Final    Comment:    For someone without known diabetes, a hemoglobin A1c value of 6.5% or greater indicates that they may have  diabetes and this should be confirmed with a follow-up  test. . For someone with known diabetes, a value <7% indicates  that their diabetes is well controlled and a value  greater than or equal to 7% indicates suboptimal  control. A1c targets should be individualized based on  duration of diabetes, age, comorbid conditions, and  other considerations. . Currently, no consensus exists regarding use of hemoglobin A1c for diagnosis of diabetes for children. .           Passed - Cr in normal range and within 360 days    Creat  Date Value Ref Range Status  09/30/2022 0.88 0.70 - 1.22 mg/dL Final   Creatinine, Urine  Date Value Ref Range Status  09/30/2022 75 20 - 320 mg/dL Final          furosemide (LASIX) 20 MG tablet [Pharmacy Med Name: FUROSEMIDE 20 MG TABS 20 Tablet] 9 tablet 10    Sig: TAKE 1 TABLET BY MOUTH DAILY     Cardiovascular:  Diuretics - Loop Failed - 10/13/2022  8:41 AM      Failed - Mg Level in normal range and within 180 days    No results found for: "MG"       Failed - Last BP in normal range    BP Readings from Last 1 Encounters:  09/30/22 (!) 146/78         Failed - Valid encounter within last 6 months    Recent Outpatient Visits           2 years ago Controlled type 2 diabetes mellitus with complication, without long-term current use of insulin (HCC)   [facility] Pickard, Priscille Heidelberg, MD   2 years ago Rash and nonspecific  skin eruption   [facility] Valentino Nose, NP   2 years ago Vitamin B12 deficiency   [facility] Pickard, Priscille Heidelberg, MD   2 years ago Anemia, unspecified type   [facility] Tanya Nones, Priscille Heidelberg, MD   3 years ago Weight loss, unintentional   [facility] Pickard, Priscille Heidelberg, MD              Passed - K in normal range and within 180 days    Potassium  Date Value Ref Range Status  09/30/2022 4.5 3.5 - 5.3 mmol/L Final         Passed - Ca in normal range and within 180 days    Calcium  Date Value Ref Range Status  09/30/2022 9.7 8.6 - 10.3 mg/dL Final         Passed - Na in normal range and within 180 days    Sodium  Date Value Ref Range Status  09/30/2022 136 135 - 146 mmol/L Final         Passed - Cr in normal range and within 180 days    Creat  Date Value Ref Range Status  09/30/2022 0.88 0.70 - 1.22 mg/dL Final   Creatinine, Urine  Date Value Ref Range Status  09/30/2022 75 20 - 320 mg/dL Final          Passed - Cl in normal range and within 180 days    Chloride  Date Value Ref Range Status  09/30/2022 100 98 - 110 mmol/L Final

## 2022-10-16 ENCOUNTER — Ambulatory Visit (INDEPENDENT_AMBULATORY_CARE_PROVIDER_SITE_OTHER): Payer: Medicare Other

## 2022-10-16 ENCOUNTER — Other Ambulatory Visit: Payer: Self-pay | Admitting: Family Medicine

## 2022-10-16 VITALS — Ht 70.0 in | Wt 172.0 lb

## 2022-10-16 DIAGNOSIS — Z0001 Encounter for general adult medical examination with abnormal findings: Secondary | ICD-10-CM | POA: Diagnosis not present

## 2022-10-16 DIAGNOSIS — Z0100 Encounter for examination of eyes and vision without abnormal findings: Secondary | ICD-10-CM

## 2022-10-16 DIAGNOSIS — E1149 Type 2 diabetes mellitus with other diabetic neurological complication: Secondary | ICD-10-CM

## 2022-10-16 DIAGNOSIS — Z0000 Encounter for general adult medical examination without abnormal findings: Secondary | ICD-10-CM

## 2022-10-16 DIAGNOSIS — B182 Chronic viral hepatitis C: Secondary | ICD-10-CM

## 2022-10-16 DIAGNOSIS — E0800 Diabetes mellitus due to underlying condition with hyperosmolarity without nonketotic hyperglycemic-hyperosmolar coma (NKHHC): Secondary | ICD-10-CM

## 2022-10-16 DIAGNOSIS — R0602 Shortness of breath: Secondary | ICD-10-CM

## 2022-10-16 DIAGNOSIS — R7989 Other specified abnormal findings of blood chemistry: Secondary | ICD-10-CM

## 2022-10-16 DIAGNOSIS — I10 Essential (primary) hypertension: Secondary | ICD-10-CM
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
## 2022-10-16 NOTE — Patient Instructions (Signed)
[name] [name] , Thank you for taking time to come for your Medicare Wellness Visit. I appreciate your ongoing commitment to your health goals. Please review the following plan we discussed and let me know if I can assist you in the future.   Referrals/Orders/Follow-Ups/Clinician Recommendations: Aim for 30 minutes of exercise or brisk walking, 6-8 glasses of water, and 5 servings of fruits and vegetables each day.  A referral to see ophthalmology has been entered you should receive a call with an appointment within 2 weeks   This is a list of the screening recommended for you and due dates:  Health Maintenance  Topic Date Due   Eye exam for diabetics  Never done   DTaP/Tdap/Td vaccine (1 - Tdap) Never done   Zoster (Shingles) Vaccine (1 of 2) Never done   Complete foot exam   01/20/2015   COVID-19 Vaccine (3 - Pfizer risk series) 05/31/2019   Flu Shot  09/04/2022   Hemoglobin A1C  04/02/2023   Yearly kidney function blood test for diabetes  09/30/2023   Yearly kidney health urinalysis for diabetes  09/30/2023   Medicare Annual Wellness Visit  10/16/2023   Pneumonia Vaccine  Completed   HPV Vaccine  Aged Out   Hepatitis C Screening  Discontinued    Advanced directives: (ACP Link)Information on Advanced Care Planning can be found at [facility] Advance Health Care Directives Advance Health Care Directives ([email or web address]   Next Medicare Annual Wellness Visit scheduled for next year: Yes

## 2022-10-17 NOTE — Telephone Encounter (Signed)
Requested Prescriptions  Pending Prescriptions Disp Refills   sitaGLIPtin (JANUVIA) 100 MG tablet [Pharmacy Med Name: JANUVIA 100 MG TABLET 100 Tablet] 90 tablet 0    Sig: TAKE 1 TABLET BY MOUTH ONCE DAILY     Endocrinology:  Diabetes - DPP-4 Inhibitors Failed - 10/16/2022  3:17 PM      Failed - Valid encounter within last 6 months    Recent Outpatient Visits           2 years ago Controlled type 2 diabetes mellitus with complication, without long-term current use of insulin (HCC)   [facility] Pickard, Priscille Heidelberg, MD   2 years ago Rash and nonspecific skin eruption   [facility] Valentino Nose, NP   2 years ago Vitamin B12 deficiency   [facility] Pickard, Priscille Heidelberg, MD   2 years ago Anemia, unspecified type   [facility] Donita Brooks, MD   3 years ago Weight loss, unintentional   [facility] Pickard, Priscille Heidelberg, MD              Passed - HBA1C is between 0 and 7.9 and within 180 days    Hgb A1c MFr Bld  Date Value Ref Range Status  09/30/2022 6.8 (H) <5.7 % of total Hgb Final    Comment:    For someone without known diabetes, a hemoglobin A1c value of 6.5% or greater indicates that they may have  diabetes and this should be confirmed with a follow-up  test. . For someone with known diabetes, a value <7% indicates  that their diabetes is well controlled and a value  greater than or equal to 7% indicates suboptimal  control. A1c targets should be individualized based on  duration of diabetes, age, comorbid conditions, and  other considerations. . Currently, no consensus exists regarding use of hemoglobin A1c for diagnosis of diabetes for children. .          Passed - Cr in normal range and within 360 days    Creat  Date Value Ref Range Status  09/30/2022 0.88 0.70 - 1.22 mg/dL Final   Creatinine, Urine  Date Value Ref Range Status  09/30/2022 75 20 - 320 mg/dL Final           furosemide (LASIX) 20 MG tablet [Pharmacy Med Name: FUROSEMIDE 20 MG TABS 20 Tablet] 90 tablet 0    Sig: TAKE 1 TABLET BY MOUTH DAILY     Cardiovascular:  Diuretics - Loop Failed - 10/16/2022  3:17 PM      Failed - Mg Level in normal range and within 180 days    No results found for: "MG"       Failed - Last BP in normal range    BP Readings from Last 1 Encounters:  09/30/22 (!) 146/78         Failed - Valid encounter within last 6 months    Recent Outpatient Visits           2 years ago Controlled type 2 diabetes mellitus with complication, without long-term current use of insulin (HCC)   [facility] Donita Brooks, MD   2 years ago Rash and nonspecific skin eruption   [facility] Valentino Nose, NP   2 years ago Vitamin B12 deficiency   [facility] Pickard, Priscille Heidelberg, MD   2 years ago Anemia, unspecified type   [facility] Donita Brooks, MD   3 years ago Weight loss, unintentional   [facility] Pickard, Priscille Heidelberg, MD              Passed - K in normal range and within 180 days    Potassium  Date Value Ref Range Status  09/30/2022 4.5 3.5 - 5.3 mmol/L Final         Passed - Ca in normal range and within 180 days    Calcium  Date Value Ref Range Status  09/30/2022 9.7 8.6 - 10.3 mg/dL Final         Passed - Na in normal range and within 180 days    Sodium  Date Value Ref Range Status  09/30/2022 136 135 - 146 mmol/L Final         Passed - Cr in normal range and within 180 days    Creat  Date Value Ref Range Status  09/30/2022 0.88 0.70 - 1.22 mg/dL Final   Creatinine, Urine  Date Value Ref Range Status  09/30/2022 75 20 - 320 mg/dL Final         Passed - Cl in normal range and within 180 days    Chloride  Date Value Ref Range Status  09/30/2022 100 98 - 110 mmol/L Final

## 2022-10-22 ENCOUNTER — Telehealth: Payer: Self-pay | Admitting: Family Medicine

## 2022-10-22 NOTE — Telephone Encounter (Signed)
Received call from [facility] with [facility] to advise [place] that the patient doesn't want to be seen at their office; patient requesting an appointment at an office closer to his home address in [place]; patient doesn't like to drive too far.   Please advise patient at [phone]

## 2022-10-28 ENCOUNTER — Telehealth: Payer: Self-pay | Admitting: Family Medicine

## 2022-10-28 NOTE — Telephone Encounter (Signed)
Received call from [facility] with [facility] to follow up on referral received for patient to have a routine eye exam. Victorino Dike stated their office only receives patients who've been referred for eye surgery after seeing an optometrist for a routine eye exam.  Victorino Dike requesting for referral to be sent to a different location.

## 2022-10-28 NOTE — Telephone Encounter (Signed)
Received call from [place] with [facility] in [place] to advise they are not currently accepting patients. [place] advised the offices in [place] and [place] are accepting new patients. Please advise patient at [phone].

## 2022-11-04 DIAGNOSIS — H35373 Puckering of macula, bilateral: Secondary | ICD-10-CM | POA: Diagnosis not present

## 2022-11-04 DIAGNOSIS — E083293 Diabetes mellitus due to underlying condition with mild nonproliferative diabetic retinopathy without macular edema, bilateral: Secondary | ICD-10-CM | POA: Diagnosis not present

## 2022-11-04 DIAGNOSIS — Z961 Presence of intraocular lens: Secondary | ICD-10-CM | POA: Diagnosis not present

## 2022-12-23 ENCOUNTER — Ambulatory Visit: Payer: Self-pay | Admitting: *Deleted

## 2022-12-23 NOTE — Patient Outreach (Signed)
  Health [employer] Plan Care Coordination   12/23/2022 Name: Rhyatt Kluz MRN: [id number] DOB: [birth year]   Care Coordination Outreach Attempts:  An unsuccessful telephone outreach was attempted for a scheduled appointment today.  Follow Up Plan:  No further outreach attempts will be made at this time. We have been unable to contact the patient to offer or enroll patient in care coordination services  Encounter Outcome:  No Answer. Left HIPAA compliant VM.   Care Coordination Interventions:  No, not indicated. Staff message sent to care guide requesting outreach and rescheduling.     Demetrios Loll, RN, BSN Care Management Coordinator [facility]  [facility] Direct Dial: [phone] Main #: [phone]

## 2022-12-26 ENCOUNTER — Ambulatory Visit: Payer: Medicare Other | Attending: Internal Medicine

## 2022-12-26 ENCOUNTER — Other Ambulatory Visit: Payer: Self-pay | Admitting: Family Medicine

## 2022-12-26 DIAGNOSIS — I495 Sick sinus syndrome: Secondary | ICD-10-CM | POA: Diagnosis not present

## 2022-12-26 DIAGNOSIS — R7989 Other specified abnormal findings of blood chemistry: Secondary | ICD-10-CM

## 2022-12-26 DIAGNOSIS — F322 Major depressive disorder, single episode, severe without psychotic features: Secondary | ICD-10-CM

## 2022-12-26 DIAGNOSIS — E0800 Diabetes mellitus due to underlying condition with hyperosmolarity without nonketotic hyperglycemic-hyperosmolar coma (NKHHC): Secondary | ICD-10-CM

## 2022-12-26 DIAGNOSIS — N138 Other obstructive and reflux uropathy: Secondary | ICD-10-CM

## 2022-12-26 DIAGNOSIS — I10 Essential (primary) hypertension: Secondary | ICD-10-CM

## 2022-12-26 DIAGNOSIS — B182 Chronic viral hepatitis C: Secondary | ICD-10-CM

## 2022-12-26 DIAGNOSIS — R0602 Shortness of breath: Secondary | ICD-10-CM

## 2022-12-26 DIAGNOSIS — F0150 Vascular dementia without behavioral disturbance: Secondary | ICD-10-CM

## 2022-12-26 LAB — CUP PACEART REMOTE DEVICE CHECK
Battery Remaining Longevity: 137 mo
Battery Voltage: 3.02 V
Brady Statistic AP VP Percent: 0.04 %
Brady Statistic AP VS Percent: 60.36 %
Brady Statistic AS VP Percent: 0.05 %
Brady Statistic AS VS Percent: 39.54 %
Brady Statistic RA Percent Paced: 60.9 %
Brady Statistic RV Percent Paced: 0.09 %
Date Time Interrogation Session: 20241122005232
Implantable Pulse Generator Implant Date: 20220420
Lead Channel Impedance Value: 323 Ohm
Lead Channel Impedance Value: 342 Ohm
Lead Channel Impedance Value: 551 Ohm
Lead Channel Impedance Value: 608 Ohm
Lead Channel Pacing Threshold Amplitude: 0.5 V
Lead Channel Pacing Threshold Amplitude: 1.5 V
Lead Channel Pacing Threshold Pulse Width: 0.4 ms
Lead Channel Pacing Threshold Pulse Width: 0.4 ms
Lead Channel Sensing Intrinsic Amplitude: 2.25 mV
Lead Channel Sensing Intrinsic Amplitude: 2.25 mV
Lead Channel Sensing Intrinsic Amplitude: 8.375 mV
Lead Channel Sensing Intrinsic Amplitude: 8.375 mV
Lead Channel Setting Pacing Amplitude: 1.5 V
Lead Channel Setting Pacing Amplitude: 3 V
Lead Channel Setting Pacing Pulse Width: 0.4 ms
Lead Channel Setting Sensing Sensitivity: 1.2 mV
Zone Setting Status: 755011
Zone Setting Status: 755011

## 2022-12-29 NOTE — Telephone Encounter (Signed)
Requested Prescriptions  Pending Prescriptions Disp Refills   sitaGLIPtin (JANUVIA) 100 MG tablet [Pharmacy Med Name: JANUVIA 100 MG TABLET 100 Tablet] 90 tablet 0    Sig: TAKE 1 TABLET BY MOUTH ONCE DAILY     Endocrinology:  Diabetes - DPP-4 Inhibitors Failed - 12/26/2022  7:08 PM      Failed - Valid encounter within last 6 months    Recent Outpatient Visits           2 years ago Controlled type 2 diabetes mellitus with complication, without long-term current use of insulin (HCC)   [facility] Pickard, Priscille Heidelberg, MD   2 years ago Rash and nonspecific skin eruption   [facility] Valentino Nose, NP   2 years ago Vitamin B12 deficiency   [facility] Pickard, Priscille Heidelberg, MD   2 years ago Anemia, unspecified type   [facility] Donita Brooks, MD   3 years ago Weight loss, unintentional   [facility] Pickard, Priscille Heidelberg, MD              Passed - HBA1C is between 0 and 7.9 and within 180 days    Hgb A1c MFr Bld  Date Value Ref Range Status  09/30/2022 6.8 (H) <5.7 % of total Hgb Final    Comment:    For someone without known diabetes, a hemoglobin A1c value of 6.5% or greater indicates that they may have  diabetes and this should be confirmed with a follow-up  test. . For someone with known diabetes, a value <7% indicates  that their diabetes is well controlled and a value  greater than or equal to 7% indicates suboptimal  control. A1c targets should be individualized based on  duration of diabetes, age, comorbid conditions, and  other considerations. . Currently, no consensus exists regarding use of hemoglobin A1c for diagnosis of diabetes for children. .          Passed - Cr in normal range and within 360 days    Creat  Date Value Ref Range Status  09/30/2022 0.88 0.70 - 1.22 mg/dL Final   Creatinine, Urine  Date Value Ref Range Status  09/30/2022 75 20 - 320 mg/dL Final           buPROPion (WELLBUTRIN XL) 150 MG 24 hr tablet [Pharmacy Med Name: BUPROPION XL 150MG  TAB 150 Tablet] 90 tablet 0    Sig: TAKE 1 TABLET BY MOUTH ONCE DAILY     Psychiatry: Antidepressants - bupropion Failed - 12/26/2022  7:08 PM      Failed - Last BP in normal range    BP Readings from Last 1 Encounters:  09/30/22 (!) 146/78         Failed - Valid encounter within last 6 months    Recent Outpatient Visits           2 years ago Controlled type 2 diabetes mellitus with complication, without long-term current use of insulin (HCC)   [facility] Donita Brooks, MD   2 years ago Rash and nonspecific skin eruption   [facility] Valentino Nose, NP   2 years ago Vitamin B12 deficiency   [facility] Pickard, Priscille Heidelberg, MD   2 years ago Anemia, unspecified type   [facility] Donita Brooks, MD   3 years ago Weight loss, unintentional   [facility] [place], Broadus John  T, MD              Passed - Cr in normal range and within 360 days    Creat  Date Value Ref Range Status  09/30/2022 0.88 0.70 - 1.22 mg/dL Final   Creatinine, Urine  Date Value Ref Range Status  09/30/2022 75 20 - 320 mg/dL Final         Passed - AST in normal range and within 360 days    AST  Date Value Ref Range Status  09/30/2022 17 10 - 35 U/L Final         Passed - ALT in normal range and within 360 days    ALT  Date Value Ref Range Status  09/30/2022 13 9 - 46 U/L Final         Passed - Completed PHQ-2 or PHQ-9 in the last 360 days       furosemide (LASIX) 20 MG tablet [Pharmacy Med Name: FUROSEMIDE 20 MG TABS 20 Tablet] 90 tablet 0    Sig: TAKE 1 TABLET BY MOUTH DAILY     Cardiovascular:  Diuretics - Loop Failed - 12/26/2022  7:08 PM      Failed - Mg Level in normal range and within 180 days    No results found for: "MG"       Failed - Last BP in normal range    BP Readings from Last 1 Encounters:   09/30/22 (!) 146/78         Failed - Valid encounter within last 6 months    Recent Outpatient Visits           2 years ago Controlled type 2 diabetes mellitus with complication, without long-term current use of insulin (HCC)   [facility] Pickard, Priscille Heidelberg, MD   2 years ago Rash and nonspecific skin eruption   [facility] Valentino Nose, NP   2 years ago Vitamin B12 deficiency   [facility] Pickard, Priscille Heidelberg, MD   2 years ago Anemia, unspecified type   [facility] Donita Brooks, MD   3 years ago Weight loss, unintentional   [facility] Pickard, Priscille Heidelberg, MD              Passed - K in normal range and within 180 days    Potassium  Date Value Ref Range Status  09/30/2022 4.5 3.5 - 5.3 mmol/L Final         Passed - Ca in normal range and within 180 days    Calcium  Date Value Ref Range Status  09/30/2022 9.7 8.6 - 10.3 mg/dL Final         Passed - Na in normal range and within 180 days    Sodium  Date Value Ref Range Status  09/30/2022 136 135 - 146 mmol/L Final         Passed - Cr in normal range and within 180 days    Creat  Date Value Ref Range Status  09/30/2022 0.88 0.70 - 1.22 mg/dL Final   Creatinine, Urine  Date Value Ref Range Status  09/30/2022 75 20 - 320 mg/dL Final         Passed - Cl in normal range and within 180 days    Chloride  Date Value Ref Range Status  09/30/2022 100 98 - 110 mmol/L Final          folic acid (FOLVITE) 1  MG tablet [Pharmacy Med Name: FOLIC ACID 1 MG TABS 1 Tablet] 90 tablet 1    Sig: TAKE 1 TABLET BY MOUTH ONCE DAILY     Endocrinology:  Vitamins Failed - 12/26/2022  7:08 PM      Failed - Valid encounter within last 12 months    Recent Outpatient Visits           2 years ago Controlled type 2 diabetes mellitus with complication, without long-term current use of insulin (HCC)   [facility] Donita Brooks, MD   2 years ago Rash and nonspecific skin eruption   [facility] Valentino Nose, NP   2 years ago Vitamin B12 deficiency   [facility] Pickard, Priscille Heidelberg, MD   2 years ago Anemia, unspecified type   [facility] Medicine Donita Brooks, MD   3 years ago Weight loss, unintentional   [facility] Pickard, Priscille Heidelberg, MD               donepezil (ARICEPT) 5 MG tablet [Pharmacy Med Name: DONEPEZIL 5MG  TABS 5 Tablet] 90 tablet 0    Sig: TAKE 1 TABLET BY MOUTH EVERY DAY AT BEDTIME     Neurology:  Alzheimer's Agents Failed - 12/26/2022  7:08 PM      Failed - Valid encounter within last 6 months    Recent Outpatient Visits           2 years ago Controlled type 2 diabetes mellitus with complication, without long-term current use of insulin (HCC)   [facility] Tanya Nones, Priscille Heidelberg, MD   2 years ago Rash and nonspecific skin eruption   [facility] Valentino Nose, NP   2 years ago Vitamin B12 deficiency   [facility] Pickard, Priscille Heidelberg, MD   2 years ago Anemia, unspecified type   [facility] Tanya Nones, Priscille Heidelberg, MD   3 years ago Weight loss, unintentional   [facility] Pickard, Priscille Heidelberg, MD               terazosin (HYTRIN) 5 MG capsule [Pharmacy Med Name: TERAZOSIN 5MG  CAPS 5 Capsule] 30 capsule 10    Sig: TAKE 1 CAPSULE BY MOUTH EVERY DAY AT BEDTIME     Cardiovascular:  Alpha Blockers Failed - 12/26/2022  7:08 PM      Failed - Last BP in normal range    BP Readings from Last 1 Encounters:  09/30/22 (!) 146/78         Failed - Valid encounter within last 6 months    Recent Outpatient Visits           2 years ago Controlled type 2 diabetes mellitus with complication, without long-term current use of insulin (HCC)   [facility] Donita Brooks, MD   2 years ago Rash and nonspecific skin eruption   [facility] Valentino Nose, NP   2 years ago Vitamin B12 deficiency   [facility] Pickard, Priscille Heidelberg, MD   2 years ago Anemia, unspecified type   [facility] Donita Brooks, MD   3 years ago Weight loss, unintentional   [facility] Pickard, Priscille Heidelberg, MD

## 2023-01-15 NOTE — Progress Notes (Signed)
Remote pacemaker transmission.   

## 2023-01-26 ENCOUNTER — Encounter: Payer: Self-pay | Admitting: *Deleted

## 2023-01-26 ENCOUNTER — Ambulatory Visit: Payer: Self-pay | Admitting: *Deleted

## 2023-01-26 NOTE — Patient Outreach (Signed)
Care Coordination   Health Equity Plan Follow Up Visit Note   01/26/2023 Name: [name] [name] MRN: [id number] DOB: Oct 17, 1940  [name] [name] is a 82 y.o. year old male who sees Pickard, Priscille Heidelberg, MD for primary care. I  spoke with patient's daughter, [name], by telephone today.  What matters to the patients health and wellness today?  Continuing to manage blood sugar well    Goals Addressed             This Visit's Progress    Patient Will Maintain an A1C of less of 7% Over the Next 6 Months   On track    Care Coordination Goals: Patient/daughter will schedule 3 month follow-up with PCP for diabetes management with A1C Patient will continue to limit or avoid sugary foods and drinks and will be mindful of carbohydrate intake during the Holiday Season. Recognize that it is a little harder to manage during this time.  3 meals per day with 30 grams of carbohydrates and up to 2 snacks per day, if needed, with less than 15 grams of carbohydrates. Patient will increase activity level as tolerated with an ultimate goal of at least 150 minutes of exercise per week Patient will reach out to RN Care Coordinator [phone] with any care coordination or resource needs         SDOH assessments and interventions completed:  Yes  SDOH Interventions Today    Flowsheet Row Most Recent Value  SDOH Interventions   Housing Interventions Intervention Not Indicated  Transportation Interventions Patient Resources (Friends/Family)  Financial Strain Interventions Intervention Not Indicated        Care Coordination Interventions:  Yes, provided  Interventions Today    Flowsheet Row Most Recent Value  Chronic Disease   Chronic disease during today's visit Diabetes  General Interventions   General Interventions Discussed/Reviewed General Interventions Discussed, General Interventions Reviewed, Labs, Annual Eye Exam, Annual Foot Exam, Durable Medical Equipment (DME), Doctor Visits,  Communication with  Labs Hgb A1c every 3 months, Kidney Function  Doctor Visits Discussed/Reviewed Doctor Visits Discussed, Doctor Visits Reviewed, Annual Wellness Visits, PCP, Specialist  [Reviewed and discussed PCP visit in August 2024. Eye exam is up-to-date. Does not see podiatrist.]  Durable Medical Equipment (DME) Glucomoter  [Blood sugar is well controlled. Highest reading has been 120. None below 70.]  PCP/Specialist Visits Compliance with follow-up visit  [Needs to schedule 3 month F/U with Dr Tanya Nones. Keep upcoming appointment with cardiologist on 02/11/23 and interventional radiologist at [facility] in February 2025.]  Communication with PCP/Specialists  [staff message sent to Dr Caren Macadam Nurse, Venia Carbon, asking if their office can reach out to patient to schedule follow-up.]  Exercise Interventions   Exercise Discussed/Reviewed Exercise Discussed, Exercise Reviewed, Physical Activity, Assistive device use and maintanence  Physical Activity Discussed/Reviewed Physical Activity Discussed, Physical Activity Reviewed  [walks around apartment and uses pedal bike. Encouraged to increase as tolerated with a goal of 150 minutes per week.]  Education Interventions   Education Provided Provided Education  Provided Verbal Education On Nutrition, Foot Care, Eye Care, Labs, Blood Sugar Monitoring, Exercise, Medication, When to see the doctor, Other  [Daughter to continue to check feet for any calusses, sores, wounds, etc. and will report any findings to PCP. Encouraged to schedule next follow-up with PCP when they are checking out at visit instead of calling or waiting for a call to schedule.]  Labs Reviewed Hgb A1c  [09/30/22 A1C 6.8%. Improved from previous reading of 7.0%. Provided praise on  improvement.]  Nutrition Interventions   Nutrition Discussed/Reviewed Nutrition Discussed, Nutrition Reviewed, Portion sizes, Fluid intake, Carbohydrate meal planning, Adding fruits and vegetables, Decreasing  sugar intake, Increasing proteins  [Manage carbohydrate intake during Holiday Season. Try to maintain intake of 3 meals per day with 30 GM of CHO and upt to 2 snacks per day, if needed, with less than 15 GM of CHO.]  Pharmacy Interventions   Pharmacy Dicussed/Reviewed Pharmacy Topics Discussed, Pharmacy Topics Reviewed, Medications and their functions  [Taking medication as prescribed. No medication changes at last PCP visit because A1C was improved.]  Safety Interventions   Safety Discussed/Reviewed Safety Discussed, Safety Reviewed, Fall Risk, Home Safety  [one mechanical fall since last telephone visit. Tripped over his Crocs. Encouraged secure fitting footware.]  Home Safety Assistive Devices  [Encouarged use of cane or walker. Patient continues to refuse.]       Follow up plan: Follow up call scheduled for 02/24/23    Encounter Outcome:  Patient Visit Completed   Demetrios Loll, RN, BSN Care Manager [place]  [facility]  Population Health  Direct Dial: [phone] Main #: [phone]

## 2023-01-27 ENCOUNTER — Other Ambulatory Visit: Payer: Self-pay | Admitting: Family Medicine

## 2023-01-27 DIAGNOSIS — I10 Essential (primary) hypertension: Secondary | ICD-10-CM

## 2023-01-27 DIAGNOSIS — E118 Type 2 diabetes mellitus with unspecified complications: Secondary | ICD-10-CM

## 2023-01-27 DIAGNOSIS — I2510 Atherosclerotic heart disease of native coronary artery without angina pectoris: Secondary | ICD-10-CM

## 2023-01-27 DIAGNOSIS — F322 Major depressive disorder, single episode, severe without psychotic features: Secondary | ICD-10-CM

## 2023-01-27 DIAGNOSIS — K219 Gastro-esophageal reflux disease without esophagitis: Secondary | ICD-10-CM

## 2023-01-27 DIAGNOSIS — F0150 Vascular dementia without behavioral disturbance: Secondary | ICD-10-CM

## 2023-01-29 NOTE — Telephone Encounter (Signed)
Labs in date.   Has appt coming up in Jan. 2025 so a 3 month supply given for all 5.  Requested Prescriptions  Pending Prescriptions Disp Refills   metFORMIN (GLUCOPHAGE) 500 MG tablet [Pharmacy Med Name: METFORMIN 500 MG TABS 500 Tablet] 120 tablet 2    Sig: TAKE TWO (2) TABLETS BY MOUTH AT BREAKFAST AND AT BEDTIME     Endocrinology:  Diabetes - Biguanides Failed - 01/29/2023  1:44 PM      Failed - B12 Level in normal range and within 720 days    Vitamin B-12  Date Value Ref Range Status  03/09/2020 1,005 200 - 1,100 pg/mL Final         Failed - Valid encounter within last 6 months    Recent Outpatient Visits           2 years ago Controlled type 2 diabetes mellitus with complication, without long-term current use of insulin (HCC)   [facility] Medicine Pickard, Priscille Heidelberg, MD   2 years ago Rash and nonspecific skin eruption   [facility] Medicine Valentino Nose, NP   2 years ago Vitamin B12 deficiency   [facility] Medicine Pickard, Priscille Heidelberg, MD   2 years ago Anemia, unspecified type   [facility] Medicine Donita Brooks, MD   3 years ago Weight loss, unintentional   [facility] Pickard, Priscille Heidelberg, MD       Future Appointments             In 2 weeks Pickard, Priscille Heidelberg, MD [place] [facility]            Passed - Cr in normal range and within 360 days    Creat  Date Value Ref Range Status  09/30/2022 0.88 0.70 - 1.22 mg/dL Final   Creatinine, Urine  Date Value Ref Range Status  09/30/2022 75 20 - 320 mg/dL Final         Passed - HBA1C is between 0 and 7.9 and within 180 days    Hgb A1c MFr Bld  Date Value Ref Range Status  09/30/2022 6.8 (H) <5.7 % of total Hgb Final    Comment:    For someone without known diabetes, a hemoglobin A1c value of 6.5% or greater indicates that they may have  diabetes and this should be confirmed with a follow-up  test. . For someone with known  diabetes, a value <7% indicates  that their diabetes is well controlled and a value  greater than or equal to 7% indicates suboptimal  control. A1c targets should be individualized based on  duration of diabetes, age, comorbid conditions, and  other considerations. . Currently, no consensus exists regarding use of hemoglobin A1c for diagnosis of diabetes for children. .          Passed - eGFR in normal range and within 360 days    GFR, Est African American  Date Value Ref Range Status  06/15/2020 96 > OR = 60 mL/min/1.67m2 Final   GFR, Est Non African American  Date Value Ref Range Status  06/15/2020 83 > OR = 60 mL/min/1.77m2 Final   eGFR  Date Value Ref Range Status  09/30/2022 86 > OR = 60 mL/min/1.66m2 Final         Passed - CBC within normal limits and completed in the last 12 months    WBC  Date Value Ref Range Status  09/30/2022 7.0 3.8 -  10.8 Thousand/uL Final   RBC  Date Value Ref Range Status  09/30/2022 3.84 (L) 4.20 - 5.80 Million/uL Final   Hemoglobin  Date Value Ref Range Status  09/30/2022 11.8 (L) 13.2 - 17.1 g/dL Final   HCT  Date Value Ref Range Status  09/30/2022 34.9 (L) 38.5 - 50.0 % Final   MCHC  Date Value Ref Range Status  09/30/2022 33.8 32.0 - 36.0 g/dL Final   [facility]  Date Value Ref Range Status  09/30/2022 30.7 27.0 - 33.0 pg Final   MCV  Date Value Ref Range Status  09/30/2022 90.9 80.0 - 100.0 fL Final   No results found for: "PLTCOUNTKUC", "LABPLAT", "POCPLA" RDW  Date Value Ref Range Status  09/30/2022 13.0 11.0 - 15.0 % Final          metoprolol tartrate (LOPRESSOR) 50 MG tablet [Pharmacy Med Name: METOPROLOL TART 50MG  TABLET 50 Tablet] 60 tablet 2    Sig: TAKE 1 TABLET BY MOUTH AT BREAKFAST AND AT BEDTIME     Cardiovascular:  Beta Blockers Failed - 01/29/2023  1:44 PM      Failed - Last BP in normal range    BP Readings from Last 1 Encounters:  09/30/22 (!) 146/78         Failed - Valid encounter within last 6  months    Recent Outpatient Visits           2 years ago Controlled type 2 diabetes mellitus with complication, without long-term current use of insulin (HCC)   [facility] Donita Brooks, MD   2 years ago Rash and nonspecific skin eruption   [facility] Valentino Nose, NP   2 years ago Vitamin B12 deficiency   [facility] Tanya Nones, Priscille Heidelberg, MD   2 years ago Anemia, unspecified type   [facility] Donita Brooks, MD   3 years ago Weight loss, unintentional   [facility] Pickard, Priscille Heidelberg, MD       Future Appointments             In 2 weeks Tanya Nones, Priscille Heidelberg, MD [place] [facility]            Passed - Last Heart Rate in normal range    Pulse Readings from Last 1 Encounters:  09/30/22 62          omeprazole (PRILOSEC) 20 MG capsule [Pharmacy Med Name: OMEPRAZOLE DR 20 MG CAPSULE 20 Capsule] 60 capsule 2    Sig: TAKE 1 CAPSULE BY MOUTH AT BREAKFAST AND AT BEDTIME     Gastroenterology: Proton Pump Inhibitors Failed - 01/29/2023  1:44 PM      Failed - Valid encounter within last 12 months    Recent Outpatient Visits           2 years ago Controlled type 2 diabetes mellitus with complication, without long-term current use of insulin (HCC)   [facility] Medicine Donita Brooks, MD   2 years ago Rash and nonspecific skin eruption   [facility] Medicine Valentino Nose, NP   2 years ago Vitamin B12 deficiency   [facility] Pickard, Priscille Heidelberg, MD   2 years ago Anemia, unspecified type   [facility] Donita Brooks, MD   3 years ago Weight loss, unintentional   [facility] Pickard, Priscille Heidelberg, MD  Future Appointments             In 2 weeks Pickard, Priscille Heidelberg, MD [place] [facility]             lisinopril (ZESTRIL) 2.5 MG tablet [Pharmacy Med  Name: LISINOPRIL 2.5 MG TABS 2.5 Tablet] 30 tablet 2    Sig: TAKE 1 TABLET BY MOUTH ONCE DAILY     Cardiovascular:  ACE Inhibitors Failed - 01/29/2023  1:44 PM      Failed - Last BP in normal range    BP Readings from Last 1 Encounters:  09/30/22 (!) 146/78         Failed - Valid encounter within last 6 months    Recent Outpatient Visits           2 years ago Controlled type 2 diabetes mellitus with complication, without long-term current use of insulin (HCC)   [facility] Medicine Pickard, Priscille Heidelberg, MD   2 years ago Rash and nonspecific skin eruption   [facility] Medicine Valentino Nose, NP   2 years ago Vitamin B12 deficiency   [facility] Pickard, Priscille Heidelberg, MD   2 years ago Anemia, unspecified type   [facility] Medicine Donita Brooks, MD   3 years ago Weight loss, unintentional   [facility] Pickard, Priscille Heidelberg, MD       Future Appointments             In 2 weeks Tanya Nones, Priscille Heidelberg, MD [place] [facility]            Passed - Cr in normal range and within 180 days    Creat  Date Value Ref Range Status  09/30/2022 0.88 0.70 - 1.22 mg/dL Final   Creatinine, Urine  Date Value Ref Range Status  09/30/2022 75 20 - 320 mg/dL Final         Passed - K in normal range and within 180 days    Potassium  Date Value Ref Range Status  09/30/2022 4.5 3.5 - 5.3 mmol/L Final         Passed - Patient is not pregnant       venlafaxine XR (EFFEXOR-XR) 75 MG 24 hr capsule [Pharmacy Med Name: VENLAFAXINE ER 75MG  CAP 75 Capsule] 30 capsule 2    Sig: TAKE 1 CAPSULE BY MOUTH ONCE DAILY     Psychiatry: Antidepressants - SNRI - desvenlafaxine & venlafaxine Failed - 01/29/2023  1:44 PM      Failed - Last BP in normal range    BP Readings from Last 1 Encounters:  09/30/22 (!) 146/78         Failed - Valid encounter within last 6 months    Recent Outpatient Visits           2 years ago  Controlled type 2 diabetes mellitus with complication, without long-term current use of insulin (HCC)   [facility] Medicine Donita Brooks, MD   2 years ago Rash and nonspecific skin eruption   [facility] Medicine Valentino Nose, NP   2 years ago Vitamin B12 deficiency   [facility] Medicine Pickard, Priscille Heidelberg, MD   2 years ago Anemia, unspecified type   [facility] Medicine Donita Brooks, MD   3 years ago Weight loss, unintentional   [facility] Pickard, Priscille Heidelberg, MD  Future Appointments             In 2 weeks Pickard, Priscille Heidelberg, MD [facility]            Failed - Lipid Panel in normal range within the last 12 months    Cholesterol  Date Value Ref Range Status  09/30/2022 99 <200 mg/dL Final   LDL Cholesterol (Calc)  Date Value Ref Range Status  09/30/2022 39 mg/dL (calc) Final    Comment:    Reference range: <100 . Desirable range <100 mg/dL for primary prevention;   <70 mg/dL for patients with CHD or diabetic patients  with > or = 2 CHD risk factors. Marland Kitchen LDL-C is now calculated using the Martin-Hopkins  calculation, which is a validated novel method providing  better accuracy than the Friedewald equation in the  estimation of LDL-C.  Horald Pollen et al. Lenox Ahr. 4098;119(14): 2061-2068  (http://education.[facility].com/faq/FAQ164)    HDL  Date Value Ref Range Status  09/30/2022 42 > OR = 40 mg/dL Final   Triglycerides  Date Value Ref Range Status  09/30/2022 94 <150 mg/dL Final         Passed - Cr in normal range and within 360 days    Creat  Date Value Ref Range Status  09/30/2022 0.88 0.70 - 1.22 mg/dL Final   Creatinine, Urine  Date Value Ref Range Status  09/30/2022 75 20 - 320 mg/dL Final         Passed - Completed PHQ-2 or PHQ-9 in the last 360 days      Refused Prescriptions Disp Refills   atorvastatin (LIPITOR) 40 MG tablet [Pharmacy Med  Name: ATORVASTATIN 40MG  TABLET 40 Tablet] 30 tablet 10    Sig: TAKE 1 TABLET BY MOUTH EVERY DAY AT BEDTIME     Cardiovascular:  Antilipid - Statins Failed - 01/29/2023  1:44 PM      Failed - Valid encounter within last 12 months    Recent Outpatient Visits           2 years ago Controlled type 2 diabetes mellitus with complication, without long-term current use of insulin (HCC)   [facility] Donita Brooks, MD   2 years ago Rash and nonspecific skin eruption   [facility] Medicine Valentino Nose, NP   2 years ago Vitamin B12 deficiency   [facility] Pickard, Priscille Heidelberg, MD   2 years ago Anemia, unspecified type   [facility] Medicine Donita Brooks, MD   3 years ago Weight loss, unintentional   [facility] Medicine Pickard, Priscille Heidelberg, MD       Future Appointments             In 2 weeks Tanya Nones, Priscille Heidelberg, MD [place] [facility]            Failed - Lipid Panel in normal range within the last 12 months    Cholesterol  Date Value Ref Range Status  09/30/2022 99 <200 mg/dL Final   LDL Cholesterol (Calc)  Date Value Ref Range Status  09/30/2022 39 mg/dL (calc) Final    Comment:    Reference range: <100 . Desirable range <100 mg/dL for primary prevention;   <70 mg/dL for patients with CHD or diabetic patients  with > or = 2 CHD risk factors. Marland Kitchen LDL-C is now calculated using the Martin-Hopkins  calculation, which is a validated novel method providing  better accuracy than the Friedewald equation in the  estimation of LDL-C.  Horald Pollen et al. Lenox Ahr. 1610;960(45): 2061-2068  (http://education.[facility].com/faq/FAQ164)    HDL  Date Value Ref Range Status  09/30/2022 42 > OR = 40 mg/dL Final   Triglycerides  Date Value Ref Range Status  09/30/2022 94 <150 mg/dL Final         Passed - Patient is not pregnant

## 2023-01-29 NOTE — Telephone Encounter (Signed)
Requested by interface surescripts. Medication discontinued 09/30/22. Future visit in 2 weeks . Requested Prescriptions  Pending Prescriptions Disp Refills   metFORMIN (GLUCOPHAGE) 500 MG tablet [Pharmacy Med Name: METFORMIN 500 MG TABS 500 Tablet] 120 tablet 10    Sig: TAKE TWO (2) TABLETS BY MOUTH AT BREAKFAST AND AT BEDTIME     Endocrinology:  Diabetes - Biguanides Failed - 01/29/2023  1:18 PM      Failed - B12 Level in normal range and within 720 days    Vitamin B-12  Date Value Ref Range Status  03/09/2020 1,005 200 - 1,100 pg/mL Final         Failed - Valid encounter within last 6 months    Recent Outpatient Visits           2 years ago Controlled type 2 diabetes mellitus with complication, without long-term current use of insulin (HCC)   [facility] Pickard, Priscille Heidelberg, MD   2 years ago Rash and nonspecific skin eruption   [facility] Medicine Valentino Nose, NP   2 years ago Vitamin B12 deficiency   [facility] Pickard, Priscille Heidelberg, MD   2 years ago Anemia, unspecified type   [facility] Donita Brooks, MD   3 years ago Weight loss, unintentional   [facility] Pickard, Priscille Heidelberg, MD       Future Appointments             In 2 weeks Pickard, Priscille Heidelberg, MD [facility]            Passed - Cr in normal range and within 360 days    Creat  Date Value Ref Range Status  09/30/2022 0.88 0.70 - 1.22 mg/dL Final   Creatinine, Urine  Date Value Ref Range Status  09/30/2022 75 20 - 320 mg/dL Final         Passed - HBA1C is between 0 and 7.9 and within 180 days    Hgb A1c MFr Bld  Date Value Ref Range Status  09/30/2022 6.8 (H) <5.7 % of total Hgb Final    Comment:    For someone without known diabetes, a hemoglobin A1c value of 6.5% or greater indicates that they may have  diabetes and this should be confirmed with a follow-up  test. . For someone with  known diabetes, a value <7% indicates  that their diabetes is well controlled and a value  greater than or equal to 7% indicates suboptimal  control. A1c targets should be individualized based on  duration of diabetes, age, comorbid conditions, and  other considerations. . Currently, no consensus exists regarding use of hemoglobin A1c for diagnosis of diabetes for children. .          Passed - eGFR in normal range and within 360 days    GFR, Est African American  Date Value Ref Range Status  06/15/2020 96 > OR = 60 mL/min/1.16m2 Final   GFR, Est Non African American  Date Value Ref Range Status  06/15/2020 83 > OR = 60 mL/min/1.69m2 Final   eGFR  Date Value Ref Range Status  09/30/2022 86 > OR = 60 mL/min/1.93m2 Final         Passed - CBC within normal limits and completed in the last 12 months    WBC  Date Value Ref Range Status  09/30/2022 7.0 3.8 - 10.8 Thousand/uL Final   RBC  Date  Value Ref Range Status  09/30/2022 3.84 (L) 4.20 - 5.80 Million/uL Final   Hemoglobin  Date Value Ref Range Status  09/30/2022 11.8 (L) 13.2 - 17.1 g/dL Final   HCT  Date Value Ref Range Status  09/30/2022 34.9 (L) 38.5 - 50.0 % Final   MCHC  Date Value Ref Range Status  09/30/2022 33.8 32.0 - 36.0 g/dL Final   [facility]  Date Value Ref Range Status  09/30/2022 30.7 27.0 - 33.0 pg Final   MCV  Date Value Ref Range Status  09/30/2022 90.9 80.0 - 100.0 fL Final   No results found for: "PLTCOUNTKUC", "LABPLAT", "POCPLA" RDW  Date Value Ref Range Status  09/30/2022 13.0 11.0 - 15.0 % Final          metoprolol tartrate (LOPRESSOR) 50 MG tablet [Pharmacy Med Name: METOPROLOL TART 50MG  TABLET 50 Tablet] 60 tablet 10    Sig: TAKE 1 TABLET BY MOUTH AT BREAKFAST AND AT BEDTIME     Cardiovascular:  Beta Blockers Failed - 01/29/2023  1:18 PM      Failed - Last BP in normal range    BP Readings from Last 1 Encounters:  09/30/22 (!) 146/78         Failed - Valid encounter within  last 6 months    Recent Outpatient Visits           2 years ago Controlled type 2 diabetes mellitus with complication, without long-term current use of insulin (HCC)   [facility] Donita Brooks, MD   2 years ago Rash and nonspecific skin eruption   [facility] Valentino Nose, NP   2 years ago Vitamin B12 deficiency   [facility] Tanya Nones, Priscille Heidelberg, MD   2 years ago Anemia, unspecified type   [facility] Donita Brooks, MD   3 years ago Weight loss, unintentional   [facility] Pickard, Priscille Heidelberg, MD       Future Appointments             In 2 weeks Tanya Nones, Priscille Heidelberg, MD [place]            Passed - Last Heart Rate in normal range    Pulse Readings from Last 1 Encounters:  09/30/22 62          omeprazole (PRILOSEC) 20 MG capsule [Pharmacy Med Name: OMEPRAZOLE DR 20 MG CAPSULE 20 Capsule] 60 capsule 10    Sig: TAKE 1 CAPSULE BY MOUTH AT BREAKFAST AND AT BEDTIME     Gastroenterology: Proton Pump Inhibitors Failed - 01/29/2023  1:18 PM      Failed - Valid encounter within last 12 months    Recent Outpatient Visits           2 years ago Controlled type 2 diabetes mellitus with complication, without long-term current use of insulin (HCC)   [facility] Medicine Donita Brooks, MD   2 years ago Rash and nonspecific skin eruption   [facility] Medicine Valentino Nose, NP   2 years ago Vitamin B12 deficiency   [facility] Pickard, Priscille Heidelberg, MD   2 years ago Anemia, unspecified type   [facility] Donita Brooks, MD   3 years ago Weight loss, unintentional   [facility] Pickard, Priscille Heidelberg, MD       Future Appointments  In 2 weeks Pickard, Priscille Heidelberg, MD [place] [facility]             lisinopril (ZESTRIL) 2.5 MG tablet  [Pharmacy Med Name: LISINOPRIL 2.5 MG TABS 2.5 Tablet] 30 tablet 10    Sig: TAKE 1 TABLET BY MOUTH ONCE DAILY     Cardiovascular:  ACE Inhibitors Failed - 01/29/2023  1:18 PM      Failed - Last BP in normal range    BP Readings from Last 1 Encounters:  09/30/22 (!) 146/78         Failed - Valid encounter within last 6 months    Recent Outpatient Visits           2 years ago Controlled type 2 diabetes mellitus with complication, without long-term current use of insulin (HCC)   [facility] Pickard, Priscille Heidelberg, MD   2 years ago Rash and nonspecific skin eruption   [facility] Medicine Valentino Nose, NP   2 years ago Vitamin B12 deficiency   [facility] Pickard, Priscille Heidelberg, MD   2 years ago Anemia, unspecified type   [facility] Donita Brooks, MD   3 years ago Weight loss, unintentional   [facility] Pickard, Priscille Heidelberg, MD       Future Appointments             In 2 weeks Tanya Nones, Priscille Heidelberg, MD [place] [facility]            Passed - Cr in normal range and within 180 days    Creat  Date Value Ref Range Status  09/30/2022 0.88 0.70 - 1.22 mg/dL Final   Creatinine, Urine  Date Value Ref Range Status  09/30/2022 75 20 - 320 mg/dL Final         Passed - K in normal range and within 180 days    Potassium  Date Value Ref Range Status  09/30/2022 4.5 3.5 - 5.3 mmol/L Final         Passed - Patient is not pregnant       venlafaxine XR (EFFEXOR-XR) 75 MG 24 hr capsule [Pharmacy Med Name: VENLAFAXINE ER 75MG  CAP 75 Capsule] 30 capsule 10    Sig: TAKE 1 CAPSULE BY MOUTH ONCE DAILY     Psychiatry: Antidepressants - SNRI - desvenlafaxine & venlafaxine Failed - 01/29/2023  1:18 PM      Failed - Last BP in normal range    BP Readings from Last 1 Encounters:  09/30/22 (!) 146/78         Failed - Valid encounter within last 6 months    Recent Outpatient Visits            2 years ago Controlled type 2 diabetes mellitus with complication, without long-term current use of insulin (HCC)   [facility] Medicine Donita Brooks, MD   2 years ago Rash and nonspecific skin eruption   [facility] Medicine Valentino Nose, NP   2 years ago Vitamin B12 deficiency   [facility] Pickard, Priscille Heidelberg, MD   2 years ago Anemia, unspecified type   [facility] Medicine Donita Brooks, MD   3 years ago Weight loss, unintentional   [facility] Pickard, Priscille Heidelberg, MD       Future Appointments  In 2 weeks Donita Brooks, MD [facility]            Failed - Lipid Panel in normal range within the last 12 months    Cholesterol  Date Value Ref Range Status  09/30/2022 99 <200 mg/dL Final   LDL Cholesterol (Calc)  Date Value Ref Range Status  09/30/2022 39 mg/dL (calc) Final    Comment:    Reference range: <100 . Desirable range <100 mg/dL for primary prevention;   <70 mg/dL for patients with CHD or diabetic patients  with > or = 2 CHD risk factors. Marland Kitchen LDL-C is now calculated using the Martin-Hopkins  calculation, which is a validated novel method providing  better accuracy than the Friedewald equation in the  estimation of LDL-C.  Horald Pollen et al. Lenox Ahr. 0454;098(11): 2061-2068  (http://education.[facility].com/faq/FAQ164)    HDL  Date Value Ref Range Status  09/30/2022 42 > OR = 40 mg/dL Final   Triglycerides  Date Value Ref Range Status  09/30/2022 94 <150 mg/dL Final         Passed - Cr in normal range and within 360 days    Creat  Date Value Ref Range Status  09/30/2022 0.88 0.70 - 1.22 mg/dL Final   Creatinine, Urine  Date Value Ref Range Status  09/30/2022 75 20 - 320 mg/dL Final         Passed - Completed PHQ-2 or PHQ-9 in the last 360 days      Refused Prescriptions Disp Refills   atorvastatin (LIPITOR) 40 MG tablet  [Pharmacy Med Name: ATORVASTATIN 40MG  TABLET 40 Tablet] 30 tablet 10    Sig: TAKE 1 TABLET BY MOUTH EVERY DAY AT BEDTIME     Cardiovascular:  Antilipid - Statins Failed - 01/29/2023  1:18 PM      Failed - Valid encounter within last 12 months    Recent Outpatient Visits           2 years ago Controlled type 2 diabetes mellitus with complication, without long-term current use of insulin (HCC)   [facility] Donita Brooks, MD   2 years ago Rash and nonspecific skin eruption   [facility] Medicine Valentino Nose, NP   2 years ago Vitamin B12 deficiency   [facility] Medicine Pickard, Priscille Heidelberg, MD   2 years ago Anemia, unspecified type   [facility] Donita Brooks, MD   3 years ago Weight loss, unintentional   [facility] Medicine Pickard, Priscille Heidelberg, MD       Future Appointments             In 2 weeks Tanya Nones, Priscille Heidelberg, MD [place] [facility]            Failed - Lipid Panel in normal range within the last 12 months    Cholesterol  Date Value Ref Range Status  09/30/2022 99 <200 mg/dL Final   LDL Cholesterol (Calc)  Date Value Ref Range Status  09/30/2022 39 mg/dL (calc) Final    Comment:    Reference range: <100 . Desirable range <100 mg/dL for primary prevention;   <70 mg/dL for patients with CHD or diabetic patients  with > or = 2 CHD risk factors. Marland Kitchen LDL-C is now calculated using the Martin-Hopkins  calculation, which is a validated novel method providing  better accuracy than the Friedewald equation in the  estimation of LDL-C.  Horald Pollen et al. Lenox Ahr. 9604;540(98): 2061-2068  (http://education.[facility].com/faq/FAQ164)    HDL  Date Value Ref Range Status  09/30/2022 42 > OR = 40 mg/dL Final   Triglycerides  Date Value Ref Range Status  09/30/2022 94 <150 mg/dL Final         Passed - Patient is not pregnant

## 2023-02-11 ENCOUNTER — Ambulatory Visit: Payer: 59 | Attending: Internal Medicine | Admitting: Internal Medicine

## 2023-02-11 ENCOUNTER — Encounter: Payer: Self-pay | Admitting: Internal Medicine

## 2023-02-11 VITALS — BP 118/74 | HR 61 | Ht 70.0 in | Wt 179.2 lb

## 2023-02-11 DIAGNOSIS — I495 Sick sinus syndrome: Secondary | ICD-10-CM | POA: Diagnosis not present
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
## 2023-02-11 NOTE — Patient Instructions (Signed)

## 2023-02-12 ENCOUNTER — Other Ambulatory Visit: Payer: 59

## 2023-02-12 ENCOUNTER — Telehealth: Payer: Self-pay

## 2023-02-12 DIAGNOSIS — I10 Essential (primary) hypertension: Secondary | ICD-10-CM | POA: Diagnosis not present

## 2023-02-12 DIAGNOSIS — E1149 Type 2 diabetes mellitus with other diabetic neurological complication: Secondary | ICD-10-CM

## 2023-02-12 DIAGNOSIS — F0150 Vascular dementia without behavioral disturbance: Secondary | ICD-10-CM

## 2023-02-12 DIAGNOSIS — I2510 Atherosclerotic heart disease of native coronary artery without angina pectoris: Secondary | ICD-10-CM | POA: Diagnosis not present

## 2023-02-12 DIAGNOSIS — Z9861 Coronary angioplasty status: Secondary | ICD-10-CM | POA: Diagnosis not present

## 2023-02-12 NOTE — Telephone Encounter (Signed)
 Pt had a Relay monitor since 09/23/2022.

## 2023-02-12 NOTE — Telephone Encounter (Signed)
 Patient would like bedside remote monitor d/t app continuing to erase from phone for unknown reason. Family requested to call Blanton Kardell at [phone].

## 2023-02-13 ENCOUNTER — Ambulatory Visit (INDEPENDENT_AMBULATORY_CARE_PROVIDER_SITE_OTHER): Payer: Medicaid Other | Admitting: Family Medicine

## 2023-02-13 VITALS — BP 140/72 | HR 60 | Temp 97.7°F | Ht 70.0 in | Wt 177.6 lb

## 2023-02-13 DIAGNOSIS — Z23 Encounter for immunization: Secondary | ICD-10-CM | POA: Diagnosis not present

## 2023-02-13 DIAGNOSIS — F0150 Vascular dementia without behavioral disturbance: Secondary | ICD-10-CM | POA: Diagnosis not present

## 2023-02-13 DIAGNOSIS — I2510 Atherosclerotic heart disease of native coronary artery without angina pectoris: Secondary | ICD-10-CM

## 2023-02-13 DIAGNOSIS — Z7984 Long term (current) use of oral hypoglycemic drugs: Secondary | ICD-10-CM

## 2023-02-13 DIAGNOSIS — E1149 Type 2 diabetes mellitus with other diabetic neurological complication: Secondary | ICD-10-CM | POA: Diagnosis not present

## 2023-02-13 DIAGNOSIS — Z9861 Coronary angioplasty status: Secondary | ICD-10-CM | POA: Diagnosis not present

## 2023-02-13 LAB — COMPLETE METABOLIC PANEL WITH GFR
AG Ratio: 1.3 (calc) (ref 1.0–2.5)
ALT: 25 U/L (ref 9–46)
AST: 22 U/L (ref 10–35)
Albumin: 3.9 g/dL (ref 3.6–5.1)
Alkaline phosphatase (APISO): 103 U/L (ref 35–144)
BUN: 20 mg/dL (ref 7–25)
CO2: 27 mmol/L (ref 20–32)
Calcium: 9.2 mg/dL (ref 8.6–10.3)
Chloride: 102 mmol/L (ref 98–110)
Creat: 0.8 mg/dL (ref 0.70–1.22)
Globulin: 3.1 g/dL (ref 1.9–3.7)
Glucose, Bld: 145 mg/dL — ABNORMAL HIGH (ref 65–99)
Potassium: 4 mmol/L (ref 3.5–5.3)
Sodium: 138 mmol/L (ref 135–146)
Total Bilirubin: 0.7 mg/dL (ref 0.2–1.2)
Total Protein: 7 g/dL (ref 6.1–8.1)
eGFR: 88 mL/min/{1.73_m2} (ref >=60)

## 2023-02-13 LAB — CBC WITH DIFFERENTIAL/PLATELET
Absolute Lymphocytes: 1170 {cells}/uL (ref 850–3900)
Absolute Monocytes: 494 {cells}/uL (ref 200–950)
Basophils Absolute: 59 {cells}/uL (ref 0–200)
Basophils Relative: 0.9 %
Eosinophils Absolute: 202 {cells}/uL (ref 15–500)
Eosinophils Relative: 3.1 %
HCT: 34.9 % — ABNORMAL LOW (ref 38.5–50.0)
Hemoglobin: 11.5 g/dL — ABNORMAL LOW (ref 13.2–17.1)
MCH: 29.9 pg (ref 27.0–33.0)
MCHC: 33 g/dL (ref 32.0–36.0)
MCV: 90.9 fL (ref 80.0–100.0)
MPV: 12.9 fL — ABNORMAL HIGH (ref 7.5–12.5)
Monocytes Relative: 7.6 %
Neutro Abs: 4576 {cells}/uL (ref 1500–7800)
Neutrophils Relative %: 70.4 %
Platelets: 119 10*3/uL — ABNORMAL LOW (ref 140–400)
RBC: 3.84 10*6/uL — ABNORMAL LOW (ref 4.20–5.80)
RDW: 13 % (ref 11.0–15.0)
Total Lymphocyte: 18 %
WBC: 6.5 10*3/uL (ref 3.8–10.8)

## 2023-02-13 LAB — HEMOGLOBIN A1C
Hgb A1c MFr Bld: 7.1 %{Hb} — ABNORMAL HIGH (ref <5.7)
Mean Plasma Glucose: 157 mg/dL
eAG (mmol/L): 8.7 mmol/L

## 2023-02-13 LAB — LIPID PANEL
Cholesterol: 93 mg/dL (ref <200)
HDL: 40 mg/dL (ref >=40)
LDL Cholesterol (Calc): 37 mg/dL
Non-HDL Cholesterol (Calc): 53 mg/dL (ref <130)
Total CHOL/HDL Ratio: 2.3 (calc) (ref <5.0)
Triglycerides: 80 mg/dL (ref <150)
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
## 2023-02-24 ENCOUNTER — Ambulatory Visit: Payer: Self-pay | Admitting: *Deleted

## 2023-02-24 ENCOUNTER — Encounter: Payer: Self-pay | Admitting: *Deleted

## 2023-02-24 ENCOUNTER — Other Ambulatory Visit: Payer: Self-pay | Admitting: Family Medicine

## 2023-02-24 MED ORDER — ATORVASTATIN CALCIUM 40 MG PO TABS: 40.0000 mg | ORAL_TABLET | Freq: Every day | ORAL | 2 refills | Status: DC

## 2023-02-24 NOTE — Telephone Encounter (Signed)
Copied from CRM [id number]. Topic: Clinical - Medication Refill >> Feb 24, 2023  9:13 AM Bobbye Morton wrote: Most Recent Primary Care Visit:  Provider: Lynnea Ferrier T  Department: [facility]  Visit Type: OFFICE VISIT  Date: 02/13/2023  Medication: atorvastatin (LIPITOR) 40 MG tablet  Has the patient contacted their pharmacy? Yes (Agent: If no, request that the patient contact the pharmacy for the refill. If patient does not wish to contact the pharmacy document the reason why and proceed with request.) (Agent: If yes, when and what did the pharmacy advise?)  Is this the correct pharmacy for this prescription? Yes If no, delete pharmacy and type the correct one.  This is the patient's preferred pharmacy:   [facility], [place] - [address] [place] [address] Phone: [phone] Fax: [phone]   Has the prescription been filled recently? No  Is the patient out of the medication? No, delivery is for next month.   Has the patient been seen for an appointment in the last year OR does the patient have an upcoming appointment? Yes  Can we respond through MyChart? No  Agent: Please be advised that Rx refills may take up to 3 business days. We ask that you follow-up with your pharmacy.

## 2023-02-24 NOTE — Patient Outreach (Signed)
Health Equity Plan Care Coordination   Follow Up Visit Note   02/24/2023 Name: [name] [name] MRN: [id number] DOB: [birth year]  [name] [name] is a 83 y.o. year old male who sees Pickard, Priscille Heidelberg, MD for primary care. I  spoke with patient's daughter, [name] [name], by telephone today.  What matters to the patients health and wellness today?  Managing blood sugar    Goals Addressed             This Visit's Progress    Patient Will Maintain an A1C of less of 7% Over the Next 6 Months   Not on track    Care Coordination Goals: Patient/daughter will schedule 3 month follow-up with PCP for diabetes management with A1C Patient will continue to limit or avoid sugary foods and drinks and will be mindful of carbohydrate intake during   3 meals per day with 30 grams of carbohydrates and up to 2 snacks per day, if needed, with less than 15 grams of carbohydrates. Patient will increase activity level as tolerated with an ultimate goal of at least 150 minutes of exercise per week Patient will reach out to RN Care Coordinator [phone] with any care coordination or resource needs         SDOH assessments and interventions completed:  Yes  SDOH Interventions Today    Flowsheet Row Most Recent Value  SDOH Interventions   Transportation Interventions Patient Resources (Friends/Family)  Physical Activity Interventions Other (Comments)  [limited but walks inside home]        Care Coordination Interventions:  Yes, provided  Interventions Today    Flowsheet Row Most Recent Value  Chronic Disease   Chronic disease during today's visit Diabetes, Other  [hyperlipidemia]  General Interventions   General Interventions Discussed/Reviewed General Interventions Discussed, General Interventions Reviewed, Labs, Durable Medical Equipment (DME), Lipid Profile, Doctor Visits, Communication with, Vaccines, Annual Eye Exam  Labs Hgb A1c every 3 months  Vaccines Flu, Pneumonia  [received vaccines at  visit on 02/13/23]  Doctor Visits Discussed/Reviewed Doctor Visits Discussed, Doctor Visits Reviewed, Annual Wellness Visits, PCP, Specialist  [reviewed visit with PCP on 02/13/23 and cardiologist on 02/11/23]  Durable Medical Equipment (DME) Glucomoter  [blood sugar was a little more elevated during the holidays but is better controlled now. No readings less than 70 or over 120.]  PCP/Specialist Visits Compliance with follow-up visit  [schedule 3 month f/u with PCP]  Communication with PCP/Specialists  [XBMWU message sent to PCP office Re: need to schedule follow-up appointment and requested outreach.]  Exercise Interventions   Exercise Discussed/Reviewed Physical Activity  Physical Activity Discussed/Reviewed Physical Activity Discussed, Physical Activity Reviewed  [encouraged to increase physical activity level with a goal of 150 minutes per week]  Education Interventions   Education Provided Provided Education  Provided Verbal Education On Nutrition, Labs, Eye Care, Foot Care, When to see the doctor, Exercise, Blood Sugar Monitoring, Medication  Labs Reviewed Hgb A1c  [02/12/23 A1C 7.1%. Increased from 6.8% in August. PCP was pleased wit results. Increase likely due to food intake over the holidays.]  Nutrition Interventions   Nutrition Discussed/Reviewed Nutrition Discussed, Nutrition Reviewed, Adding fruits and vegetables, Carbohydrate meal planning, Portion sizes, Decreasing sugar intake, Fluid intake, Increasing proteins  [3 meals per day with 30 GM of CHO and up to 2 snacks per day with 15 GM of CHO if needed]  Pharmacy Interventions   Pharmacy Dicussed/Reviewed Pharmacy Topics Discussed, Pharmacy Topics Reviewed, Medications and their functions  [taking medications as directed. Receives prepackaged  meds by mail monthly. Has call into PCP office requesting refill on Atorvastatin.]  Safety Interventions   Safety Discussed/Reviewed Safety Discussed, Safety Reviewed, Fall Risk, Home Safety  [No  falls since last telephone call]  Home Safety Assistive Devices       Follow up plan: Follow up call scheduled for 04/24/23    Encounter Outcome:  Patient Visit Completed   Demetrios Loll, RN, BSN [place]  [facility], [facility] RN Care Manager Direct Dial: [phone]

## 2023-03-12 DIAGNOSIS — C220 Liver cell carcinoma: Secondary | ICD-10-CM | POA: Diagnosis not present

## 2023-03-12 DIAGNOSIS — Z9889 Other specified postprocedural states: Secondary | ICD-10-CM | POA: Diagnosis not present

## 2023-03-12 DIAGNOSIS — C787 Secondary malignant neoplasm of liver and intrahepatic bile duct: Secondary | ICD-10-CM | POA: Diagnosis not present

## 2023-03-12 DIAGNOSIS — Z01818 Encounter for other preprocedural examination: Secondary | ICD-10-CM | POA: Diagnosis not present

## 2023-03-16 DIAGNOSIS — C220 Liver cell carcinoma: Secondary | ICD-10-CM | POA: Diagnosis not present

## 2023-03-27 ENCOUNTER — Ambulatory Visit (INDEPENDENT_AMBULATORY_CARE_PROVIDER_SITE_OTHER): Payer: Medicare Other

## 2023-03-27 DIAGNOSIS — I495 Sick sinus syndrome: Secondary | ICD-10-CM | POA: Diagnosis not present

## 2023-03-27 LAB — CUP PACEART REMOTE DEVICE CHECK
Battery Remaining Longevity: 134 mo
Battery Voltage: 3.02 V
Brady Statistic AP VP Percent: 0.03 %
Brady Statistic AP VS Percent: 62.85 %
Brady Statistic AS VP Percent: 0.05 %
Brady Statistic AS VS Percent: 37.08 %
Brady Statistic RA Percent Paced: 63.4 %
Brady Statistic RV Percent Paced: 0.07 %
Date Time Interrogation Session: 20250221020843
Implantable Pulse Generator Implant Date: 20220420
Lead Channel Impedance Value: 323 Ohm
Lead Channel Impedance Value: 342 Ohm
Lead Channel Impedance Value: 551 Ohm
Lead Channel Impedance Value: 608 Ohm
Lead Channel Pacing Threshold Amplitude: 0.5 V
Lead Channel Pacing Threshold Amplitude: 1.25 V
Lead Channel Pacing Threshold Pulse Width: 0.4 ms
Lead Channel Pacing Threshold Pulse Width: 0.4 ms
Lead Channel Sensing Intrinsic Amplitude: 2 mV
Lead Channel Sensing Intrinsic Amplitude: 2 mV
Lead Channel Sensing Intrinsic Amplitude: 7.75 mV
Lead Channel Sensing Intrinsic Amplitude: 7.75 mV
Lead Channel Setting Pacing Amplitude: 1.5 V
Lead Channel Setting Pacing Amplitude: 3 V
Lead Channel Setting Pacing Pulse Width: 0.4 ms
Lead Channel Setting Sensing Sensitivity: 1.2 mV
Zone Setting Status: 755011
Zone Setting Status: 755011

## 2023-03-30 ENCOUNTER — Other Ambulatory Visit: Payer: Self-pay | Admitting: Family Medicine

## 2023-03-30 DIAGNOSIS — E0800 Diabetes mellitus due to underlying condition with hyperosmolarity without nonketotic hyperglycemic-hyperosmolar coma (NKHHC): Secondary | ICD-10-CM

## 2023-03-30 DIAGNOSIS — R7989 Other specified abnormal findings of blood chemistry: Secondary | ICD-10-CM

## 2023-03-30 DIAGNOSIS — B182 Chronic viral hepatitis C: Secondary | ICD-10-CM

## 2023-03-30 DIAGNOSIS — F0150 Vascular dementia without behavioral disturbance: Secondary | ICD-10-CM

## 2023-03-30 DIAGNOSIS — F322 Major depressive disorder, single episode, severe without psychotic features: Secondary | ICD-10-CM

## 2023-03-30 DIAGNOSIS — K7460 Unspecified cirrhosis of liver: Secondary | ICD-10-CM

## 2023-03-30 DIAGNOSIS — R0602 Shortness of breath: Secondary | ICD-10-CM

## 2023-03-30 DIAGNOSIS — I10 Essential (primary) hypertension: Secondary | ICD-10-CM

## 2023-04-24 ENCOUNTER — Ambulatory Visit: Payer: Self-pay | Admitting: *Deleted

## 2023-04-24 NOTE — Patient Outreach (Signed)
 Health Equity Plan Care Coordination   04/24/2023 Name: [name] [name] MRN: [id number] DOB: Jul 14, 1940   Care Coordination Outreach Attempts:  An unsuccessful outreach was attempted for an appointment today.  Follow Up Plan:  Additional outreach attempts will be made to offer the patient complex care management information and services.   Encounter Outcome:  No Answer. Left HIPAA compliant VM.   Care Coordination Interventions:  No, not indicated. Staff message sent to Care Guide for outreach and rescheduling.    Demetrios Loll, RN, BSN [place]  [facility], [facility] RN Care Manager Direct Dial: [phone]

## 2023-04-28 ENCOUNTER — Other Ambulatory Visit: Payer: Self-pay | Admitting: Family Medicine

## 2023-04-28 DIAGNOSIS — E118 Type 2 diabetes mellitus with unspecified complications: Secondary | ICD-10-CM

## 2023-04-28 DIAGNOSIS — F322 Major depressive disorder, single episode, severe without psychotic features: Secondary | ICD-10-CM

## 2023-04-28 DIAGNOSIS — I2510 Atherosclerotic heart disease of native coronary artery without angina pectoris: Secondary | ICD-10-CM

## 2023-04-28 DIAGNOSIS — K219 Gastro-esophageal reflux disease without esophagitis: Secondary | ICD-10-CM

## 2023-04-28 DIAGNOSIS — I10 Essential (primary) hypertension: Secondary | ICD-10-CM

## 2023-04-28 DIAGNOSIS — F0150 Vascular dementia without behavioral disturbance: Secondary | ICD-10-CM

## 2023-04-28 NOTE — Progress Notes (Signed)
 Remote pacemaker transmission.

## 2023-04-29 ENCOUNTER — Telehealth: Payer: Self-pay | Admitting: *Deleted

## 2023-04-29 NOTE — Progress Notes (Signed)
 Health Equity Plan  Care Guide Note  04/29/2023 Name: [name] MRN: [id number] DOB: 11/02/1940  [name] [name] is a 83 y.o. year old male who is a primary care patient of Tanya Nones, Priscille Heidelberg, MD and is actively engaged with the care management team. I reached out to [name] by phone today to assist with re-scheduling  with the RN Case Manager.  Follow up plan: Unsuccessful telephone outreach attempt made. A HIPAA compliant phone message was left for the patient providing contact information and requesting a return call.  Gwenevere Ghazi  [facility]  [facility], [facility] Guide  Direct Dial: [phone]  Fax [phone]

## 2023-04-30 NOTE — Telephone Encounter (Signed)
 Last OV 02/28/23, within protocol.  Requested Prescriptions  Pending Prescriptions Disp Refills   venlafaxine XR (EFFEXOR-XR) 75 MG 24 hr capsule [Pharmacy Med Name: VENLAFAXINE ER 75MG  CAP 75 Capsule] 90 capsule 0    Sig: TAKE 1 CAPSULE BY MOUTH ONCE DAILY     Psychiatry: Antidepressants - SNRI - desvenlafaxine & venlafaxine Failed - 04/30/2023 12:09 PM      Failed - Last BP in normal range    BP Readings from Last 1 Encounters:  02/13/23 (!) 140/72         Failed - Lipid Panel in normal range within the last 12 months    Cholesterol  Date Value Ref Range Status  02/12/2023 93 <200 mg/dL Final   LDL Cholesterol (Calc)  Date Value Ref Range Status  02/12/2023 37 mg/dL (calc) Final    Comment:    Reference range: <100 . Desirable range <100 mg/dL for primary prevention;   <70 mg/dL for patients with CHD or diabetic patients  with > or = 2 CHD risk factors. Marland Kitchen LDL-C is now calculated using the Martin-Hopkins  calculation, which is a validated novel method providing  better accuracy than the Friedewald equation in the  estimation of LDL-C.  Horald Pollen et al. Lenox Ahr. 9147;829(56): 2061-2068  (http://education.[facility].com/faq/FAQ164)    HDL  Date Value Ref Range Status  02/12/2023 40 > OR = 40 mg/dL Final   Triglycerides  Date Value Ref Range Status  02/12/2023 80 <150 mg/dL Final         Passed - Cr in normal range and within 360 days    Creat  Date Value Ref Range Status  02/12/2023 0.80 0.70 - 1.22 mg/dL Final   Creatinine, Urine  Date Value Ref Range Status  09/30/2022 75 20 - 320 mg/dL Final         Passed - Completed PHQ-2 or PHQ-9 in the last 360 days      Passed - Valid encounter within last 6 months    Recent Outpatient Visits           2 months ago Flu vaccine need   [place] [facility] Medicine Donita Brooks, MD   7 months ago Type II diabetes mellitus with neurological manifestations [facility])   [place] [facility] Family  Medicine Donita Brooks, MD   10 months ago Diabetes mellitus due to underlying condition with hyperosmolarity without coma, without long-term current use of insulin [facility])   [place] [facility] Pickard, Priscille Heidelberg, MD   11 months ago Chronic rhinitis   [place] [facility] Family Medicine Donita Brooks, MD   1 year ago Diabetes mellitus due to underlying condition with hyperosmolarity without coma, without long-term current use of insulin (HCC)   [place] [facility] Family Medicine Pickard, Priscille Heidelberg, MD               omeprazole (PRILOSEC) 20 MG capsule [Pharmacy Med Name: OMEPRAZOLE DR 20 MG CAPSULE 20 Capsule] 180 capsule 0    Sig: TAKE 1 CAPSULE BY MOUTH AT BREAKFAST AND TAKE 1 CAPSULE BY MOUTH AT BEDTIME     Gastroenterology: Proton Pump Inhibitors Passed - 04/30/2023 12:09 PM      Passed - Valid encounter within last 12 months    Recent Outpatient Visits           2 months ago Flu vaccine need   [place] [facility] Medicine Pickard, Priscille Heidelberg, MD   7  months ago Type II diabetes mellitus with neurological manifestations [facility])   [place] [facility] Family Medicine Pickard, Priscille Heidelberg, MD   10 months ago Diabetes mellitus due to underlying condition with hyperosmolarity without coma, without long-term current use of insulin (HCC)   [place] [facility] Family Medicine Donita Brooks, MD   11 months ago Chronic rhinitis   [place] [facility] Family Medicine Donita Brooks, MD   1 year ago Diabetes mellitus due to underlying condition with hyperosmolarity without coma, without long-term current use of insulin [facility])   [place] [facility] Medicine Pickard, Priscille Heidelberg, MD               atorvastatin (LIPITOR) 40 MG tablet [Pharmacy Med Name: ATORVASTATIN 40MG  TABLET 40 Tablet] 90 tablet 0    Sig: TAKE 1 TABLET BY MOUTH AT BEDTIME     Cardiovascular:  Antilipid - Statins Failed - 04/30/2023 12:09 PM       Failed - Lipid Panel in normal range within the last 12 months    Cholesterol  Date Value Ref Range Status  02/12/2023 93 <200 mg/dL Final   LDL Cholesterol (Calc)  Date Value Ref Range Status  02/12/2023 37 mg/dL (calc) Final    Comment:    Reference range: <100 . Desirable range <100 mg/dL for primary prevention;   <70 mg/dL for patients with CHD or diabetic patients  with > or = 2 CHD risk factors. Marland Kitchen LDL-C is now calculated using the Martin-Hopkins  calculation, which is a validated novel method providing  better accuracy than the Friedewald equation in the  estimation of LDL-C.  Horald Pollen et al. Lenox Ahr. 4098;119(14): 2061-2068  (http://education.[facility].com/faq/FAQ164)    HDL  Date Value Ref Range Status  02/12/2023 40 > OR = 40 mg/dL Final   Triglycerides  Date Value Ref Range Status  02/12/2023 80 <150 mg/dL Final         Passed - Patient is not pregnant      Passed - Valid encounter within last 12 months    Recent Outpatient Visits           2 months ago Flu vaccine need   [place] [facility] Medicine Donita Brooks, MD   7 months ago Type II diabetes mellitus with neurological manifestations [facility])   [place] [facility] Family Medicine Donita Brooks, MD   10 months ago Diabetes mellitus due to underlying condition with hyperosmolarity without coma, without long-term current use of insulin [facility])   [place] [facility] Medicine Pickard, Priscille Heidelberg, MD   11 months ago Chronic rhinitis   [facility] [place] Family Medicine Donita Brooks, MD   1 year ago Diabetes mellitus due to underlying condition with hyperosmolarity without coma, without long-term current use of insulin [facility])   [place] [facility] Medicine Pickard, Priscille Heidelberg, MD               metoprolol tartrate (LOPRESSOR) 50 MG tablet [Pharmacy Med Name: METOPROLOL TART 50MG  TABLET 50 Tablet] 180 tablet 0    Sig: TAKE 1 TABLET BY MOUTH AT  BREAKFAST AND TAKE 1 TABLET BY MOUTH AT BEDTIME     Cardiovascular:  Beta Blockers Failed - 04/30/2023 12:09 PM      Failed - Last BP in normal range    BP Readings from Last 1 Encounters:  02/13/23 (!) 140/72         Passed -  Last Heart Rate in normal range    Pulse Readings from Last 1 Encounters:  02/13/23 60         Passed - Valid encounter within last 6 months    Recent Outpatient Visits           2 months ago Flu vaccine need   [place] [facility] Medicine Donita Brooks, MD   7 months ago Type II diabetes mellitus with neurological manifestations [facility])   [place] [facility] Family Medicine Pickard, Priscille Heidelberg, MD   10 months ago Diabetes mellitus due to underlying condition with hyperosmolarity without coma, without long-term current use of insulin [facility])   [place] [facility] Family Medicine Donita Brooks, MD   11 months ago Chronic rhinitis   [place] [facility] Family Medicine Pickard, Priscille Heidelberg, MD   1 year ago Diabetes mellitus due to underlying condition with hyperosmolarity without coma, without long-term current use of insulin (HCC)   [place] [facility] Family Medicine Pickard, Priscille Heidelberg, MD               metFORMIN (GLUCOPHAGE) 500 MG tablet [Pharmacy Med Name: METFORMIN 500 MG TABS 500 Tablet] 360 tablet 0    Sig: TAKE TWO (2) TABLETS BY MOUTH AT BREAKFAST AND TAKE TWO (2) TABLETS BY MOUTH AT BEDTIME     Endocrinology:  Diabetes - Biguanides Failed - 04/30/2023 12:09 PM      Failed - B12 Level in normal range and within 720 days    Vitamin B-12  Date Value Ref Range Status  03/09/2020 1,005 200 - 1,100 pg/mL Final         Passed - Cr in normal range and within 360 days    Creat  Date Value Ref Range Status  02/12/2023 0.80 0.70 - 1.22 mg/dL Final   Creatinine, Urine  Date Value Ref Range Status  09/30/2022 75 20 - 320 mg/dL Final         Passed - HBA1C is between 0 and 7.9 and within 180 days    Hgb A1c MFr Bld   Date Value Ref Range Status  02/12/2023 7.1 (H) <5.7 % of total Hgb Final    Comment:    For someone without known diabetes, a hemoglobin A1c value of 6.5% or greater indicates that they may have  diabetes and this should be confirmed with a follow-up  test. . For someone with known diabetes, a value <7% indicates  that their diabetes is well controlled and a value  greater than or equal to 7% indicates suboptimal  control. A1c targets should be individualized based on  duration of diabetes, age, comorbid conditions, and  other considerations. . Currently, no consensus exists regarding use of hemoglobin A1c for diagnosis of diabetes for children. .          Passed - eGFR in normal range and within 360 days    GFR, Est African American  Date Value Ref Range Status  06/15/2020 96 > OR = 60 mL/min/1.14m2 Final   GFR, Est Non African American  Date Value Ref Range Status  06/15/2020 83 > OR = 60 mL/min/1.75m2 Final   eGFR  Date Value Ref Range Status  02/12/2023 88 > OR = 60 mL/min/1.20m2 Final         Passed - Valid encounter within last 6 months    Recent Outpatient Visits           2 months ago Flu vaccine  need   [place] [facility] Medicine Tanya Nones, Priscille Heidelberg, MD   7 months ago Type II diabetes mellitus with neurological manifestations [facility])   [place] [facility] Family Medicine Pickard, Priscille Heidelberg, MD   10 months ago Diabetes mellitus due to underlying condition with hyperosmolarity without coma, without long-term current use of insulin [facility])   [place] [facility] Family Medicine Donita Brooks, MD   11 months ago Chronic rhinitis   [place] [facility] Family Medicine Tanya Nones, Priscille Heidelberg, MD   1 year ago Diabetes mellitus due to underlying condition with hyperosmolarity without coma, without long-term current use of insulin [facility])   [place] [facility] Pickard, Priscille Heidelberg, MD              Passed - CBC within normal  limits and completed in the last 12 months    WBC  Date Value Ref Range Status  02/12/2023 6.5 3.8 - 10.8 Thousand/uL Final   RBC  Date Value Ref Range Status  02/12/2023 3.84 (L) 4.20 - 5.80 Million/uL Final   Hemoglobin  Date Value Ref Range Status  02/12/2023 11.5 (L) 13.2 - 17.1 g/dL Final   HCT  Date Value Ref Range Status  02/12/2023 34.9 (L) 38.5 - 50.0 % Final   MCHC  Date Value Ref Range Status  02/12/2023 33.0 32.0 - 36.0 g/dL Final    Comment:    For adults, a slight decrease in the calculated MCHC value (in the range of 30 to 32 g/dL) is most likely not clinically significant; however, it should be interpreted with caution in correlation with other red cell parameters and the patient's clinical condition.    [facility]  Date Value Ref Range Status  02/12/2023 29.9 27.0 - 33.0 pg Final   MCV  Date Value Ref Range Status  02/12/2023 90.9 80.0 - 100.0 fL Final   No results found for: "PLTCOUNTKUC", "LABPLAT", "POCPLA" RDW  Date Value Ref Range Status  02/12/2023 13.0 11.0 - 15.0 % Final          lisinopril (ZESTRIL) 2.5 MG tablet [Pharmacy Med Name: LISINOPRIL 2.5 MG TABS 2.5 Tablet] 90 tablet 0    Sig: TAKE 1 TABLET BY MOUTH ONCE DAILY     Cardiovascular:  ACE Inhibitors Failed - 04/30/2023 12:09 PM      Failed - Last BP in normal range    BP Readings from Last 1 Encounters:  02/13/23 (!) 140/72         Passed - Cr in normal range and within 180 days    Creat  Date Value Ref Range Status  02/12/2023 0.80 0.70 - 1.22 mg/dL Final   Creatinine, Urine  Date Value Ref Range Status  09/30/2022 75 20 - 320 mg/dL Final         Passed - K in normal range and within 180 days    Potassium  Date Value Ref Range Status  02/12/2023 4.0 3.5 - 5.3 mmol/L Final         Passed - Patient is not pregnant      Passed - Valid encounter within last 6 months    Recent Outpatient Visits           2 months ago Flu vaccine need   [place] [facility]  Medicine Donita Brooks, MD   7 months ago Type II diabetes mellitus with neurological manifestations [facility])   [place] [facility] Family Medicine Lynnea Ferrier  T, MD   10 months ago Diabetes mellitus due to underlying condition with hyperosmolarity without coma, without long-term current use of insulin [facility])   [place] [facility] Medicine Pickard, Priscille Heidelberg, MD   11 months ago Chronic rhinitis   [place] [facility] Family Medicine Donita Brooks, MD   1 year ago Diabetes mellitus due to underlying condition with hyperosmolarity without coma, without long-term current use of insulin [facility])   [place] [facility] Family Medicine Pickard, Priscille Heidelberg, MD

## 2023-05-08 NOTE — Progress Notes (Signed)
 Complex Care Management Care Guide Note  05/08/2023 Name: [name] [name] MRN: [id number] DOB: 1940-08-27  [name] [name] is a 83 y.o. year old male who is a primary care patient of Tanya Nones, Priscille Heidelberg, MD and is actively engaged with the care management team. I reached out to [name] by phone today to assist with re-scheduling  with the RN Case Manager.  Follow up plan: Unsuccessful telephone outreach attempt made. A HIPAA compliant phone message was left for the patient providing contact information and requesting a return call. No further outreach attempts will be made at this time. We have been unable to contact the patient to reschedule for complex care management services.   Gwenevere Ghazi  [facility]  [facility], [facility] Guide  Direct Dial: [phone]  Fax [phone]

## 2023-05-18 ENCOUNTER — Telehealth: Payer: Self-pay | Admitting: *Deleted

## 2023-05-18 NOTE — Progress Notes (Signed)
 Complex Care Management Care Guide Note  05/18/2023 Name: [name] [name] MRN: [id number] DOB: 06/11/1940  [name] [name] is a 83 y.o. year old male who is a primary care patient of Cheril Cork, Cisco Crest, MD and is actively engaged with the care management team.  Rashawd Laskaris daughter Mikaeel Petrow called by phone today to assist with  health equity calls   with the RN Case Manager. No further outreach attempts will be made at this time.   Follow up plan: None   Barnie Bora  [facility], [facility] Guide  Direct Dial: [phone]  Fax [phone]

## 2023-05-19 ENCOUNTER — Encounter: Payer: Self-pay | Admitting: *Deleted

## 2023-05-19 NOTE — Patient Outreach (Signed)
 Health Equity Plan 05/19/2023  Received message from Care Guide stating that patient/caregiver wishes to be removed from the Health Equity Plan program. Goals closed and RN Care Manager removed from Care Team.   Michele Ahle, RN, BSN [place]  [facility] RN Care Manager Direct Dial: [phone]  Fax: [phone]

## 2023-06-25 ENCOUNTER — Other Ambulatory Visit: Payer: Self-pay | Admitting: Family Medicine

## 2023-06-25 DIAGNOSIS — F0150 Vascular dementia without behavioral disturbance: Secondary | ICD-10-CM

## 2023-06-25 LAB — CUP PACEART REMOTE DEVICE CHECK
Battery Remaining Longevity: 132 mo
Battery Voltage: 3.02 V
Brady Statistic AP VP Percent: 0.03 %
Brady Statistic AP VS Percent: 54.99 %
Brady Statistic AS VP Percent: 0.06 %
Brady Statistic AS VS Percent: 44.92 %
Brady Statistic RA Percent Paced: 55.59 %
Brady Statistic RV Percent Paced: 0.08 %
Date Time Interrogation Session: 20250522200414
Implantable Pulse Generator Implant Date: 20220420
Lead Channel Impedance Value: 342 Ohm
Lead Channel Impedance Value: 361 Ohm
Lead Channel Impedance Value: 532 Ohm
Lead Channel Impedance Value: 589 Ohm
Lead Channel Pacing Threshold Amplitude: 0.5 V
Lead Channel Pacing Threshold Amplitude: 1.5 V
Lead Channel Pacing Threshold Pulse Width: 0.4 ms
Lead Channel Pacing Threshold Pulse Width: 0.4 ms
Lead Channel Sensing Intrinsic Amplitude: 2.25 mV
Lead Channel Sensing Intrinsic Amplitude: 2.25 mV
Lead Channel Sensing Intrinsic Amplitude: 6.875 mV
Lead Channel Sensing Intrinsic Amplitude: 6.875 mV
Lead Channel Setting Pacing Amplitude: 1.5 V
Lead Channel Setting Pacing Amplitude: 3 V
Lead Channel Setting Pacing Pulse Width: 0.4 ms
Lead Channel Setting Sensing Sensitivity: 1.2 mV
Zone Setting Status: 755011
Zone Setting Status: 755011

## 2023-06-26 ENCOUNTER — Ambulatory Visit (INDEPENDENT_AMBULATORY_CARE_PROVIDER_SITE_OTHER): Payer: Medicare Other

## 2023-06-26 DIAGNOSIS — I495 Sick sinus syndrome: Secondary | ICD-10-CM

## 2023-06-26 NOTE — Telephone Encounter (Signed)
 Requested Prescriptions  Pending Prescriptions Disp Refills   folic acid  (FOLVITE ) 1 MG tablet [Pharmacy Med Name: FOLIC ACID  1 MG TABS 1 Tablet] 90 tablet 1    Sig: TAKE 1 TABLET BY MOUTH ONCE DAILY     Endocrinology:  Vitamins Passed - 06/26/2023  5:40 PM      Passed - Valid encounter within last 12 months    Recent Outpatient Visits           4 months ago Flu vaccine need   [place] [facility] Medicine Austine Lefort, MD   8 months ago Type II diabetes mellitus with neurological manifestations [facility])   [place] [facility] Family Medicine Austine Lefort, MD   1 year ago Diabetes mellitus due to underlying condition with hyperosmolarity without coma, without long-term current use of insulin  [facility])   [place] [facility] Family Medicine Pickard, Cisco Crest, MD   1 year ago Chronic rhinitis   [place] [facility] Family Medicine Austine Lefort, MD   1 year ago Diabetes mellitus due to underlying condition with hyperosmolarity without coma, without long-term current use of insulin  [facility])   [place] [facility] Family Medicine Pickard, Cisco Crest, MD

## 2023-06-28 ENCOUNTER — Ambulatory Visit: Payer: Self-pay | Admitting: Internal Medicine

## 2023-07-13 ENCOUNTER — Ambulatory Visit: Admitting: Family Medicine

## 2023-07-13 ENCOUNTER — Encounter: Payer: Self-pay | Admitting: Family Medicine

## 2023-07-13 VITALS — BP 132/64 | HR 62 | Ht 70.0 in | Wt 173.4 lb

## 2023-07-13 DIAGNOSIS — M7022 Olecranon bursitis, left elbow: Secondary | ICD-10-CM | POA: Diagnosis not present
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
## 2023-07-24 ENCOUNTER — Telehealth: Payer: Self-pay | Admitting: Internal Medicine

## 2023-07-24 NOTE — Telephone Encounter (Signed)
 Patient's daughter states when patient saw Dr. Carolynne Citron in January it was discussed that he would be getting a new monitor for his pacemaker. He has not received this, but Daivd Dub states current monitor seems to be working fine.  Will forward to device team to follow-up if needed.

## 2023-07-24 NOTE — Telephone Encounter (Signed)
 Daughter calling in because patient is suppose to get a new monitor and they haven't received it. Please advise

## 2023-07-27 ENCOUNTER — Other Ambulatory Visit: Payer: Self-pay | Admitting: Family Medicine

## 2023-07-27 DIAGNOSIS — I10 Essential (primary) hypertension: Secondary | ICD-10-CM

## 2023-07-27 DIAGNOSIS — E118 Type 2 diabetes mellitus with unspecified complications: Secondary | ICD-10-CM

## 2023-07-27 DIAGNOSIS — F0150 Vascular dementia without behavioral disturbance: Secondary | ICD-10-CM

## 2023-07-27 DIAGNOSIS — I2510 Atherosclerotic heart disease of native coronary artery without angina pectoris: Secondary | ICD-10-CM

## 2023-07-27 DIAGNOSIS — F322 Major depressive disorder, single episode, severe without psychotic features: Secondary | ICD-10-CM

## 2023-07-27 DIAGNOSIS — K219 Gastro-esophageal reflux disease without esophagitis: Secondary | ICD-10-CM

## 2023-08-04 NOTE — Progress Notes (Signed)
 Remote pacemaker transmission.

## 2023-08-09 ENCOUNTER — Emergency Department (HOSPITAL_COMMUNITY)
Admission: EM | Admit: 2023-08-09 | Discharge: 2023-08-09 | Disposition: A | Discharge status: Home / Self Care | Attending: Emergency Medicine | Admitting: Emergency Medicine

## 2023-08-09 ENCOUNTER — Emergency Department (HOSPITAL_COMMUNITY)

## 2023-08-09 DIAGNOSIS — M79675 Pain in left toe(s): Secondary | ICD-10-CM | POA: Diagnosis not present

## 2023-08-09 DIAGNOSIS — E119 Type 2 diabetes mellitus without complications: Secondary | ICD-10-CM | POA: Diagnosis not present

## 2023-08-09 DIAGNOSIS — Z7982 Long term (current) use of aspirin: Secondary | ICD-10-CM | POA: Diagnosis not present

## 2023-08-09 DIAGNOSIS — S90212A Contusion of left great toe with damage to nail, initial encounter: Secondary | ICD-10-CM

## 2023-08-09 DIAGNOSIS — W1831XA Fall on same level due to stepping on an object, initial encounter: Secondary | ICD-10-CM | POA: Diagnosis not present

## 2023-08-09 DIAGNOSIS — S90112A Contusion of left great toe without damage to nail, initial encounter: Secondary | ICD-10-CM | POA: Diagnosis not present

## 2023-08-09 DIAGNOSIS — Z043 Encounter for examination and observation following other accident: Secondary | ICD-10-CM | POA: Diagnosis not present

## 2023-08-09 DIAGNOSIS — M19072 Primary osteoarthritis, left ankle and foot: Secondary | ICD-10-CM | POA: Diagnosis not present

## 2023-08-09 MED ORDER — BACITRACIN ZINC 500 UNIT/GM EX OINT: 1.0000 | TOPICAL_OINTMENT | Freq: Two times a day (BID) | CUTANEOUS | 0 refills | Status: AC

## 2023-08-09 MED ORDER — NAPROXEN 250 MG PO TABS: 250.0000 mg | ORAL_TABLET | Freq: Two times a day (BID) | ORAL | 0 refills | Status: DC

## 2023-08-09 MED ORDER — TRIPLE ANTIBIOTIC 3.5-400-5000 EX OINT
TOPICAL_OINTMENT | Freq: Once | CUTANEOUS | Status: AC
  Administered 2023-08-09: 1 via CUTANEOUS
  Filled 2023-08-09: qty 1

## 2023-08-09 MED ORDER — CEPHALEXIN 500 MG PO CAPS
500.0000 mg | ORAL_CAPSULE | Freq: Three times a day (TID) | ORAL | 0 refills | Status: AC
Start: 2023-08-09 — End: 2023-08-16

## 2023-08-09 NOTE — ED Triage Notes (Addendum)
 Pt states that yesterday evening he was in his apartment and slipped on a paper plate that was on the ground and injured his L great toe. Pt denies other injury. No head injury/LOC, not on blood thinners.

## 2023-08-09 NOTE — Discharge Instructions (Addendum)
 Please continue to follow-up closely with your primary care doctor on an outpatient basis.  Take all antibiotics as directed.  Please continue good wound care as discussed at the bedside.  Return to the emergency department immediately for any new or worsening symptoms.

## 2023-08-09 NOTE — ED Provider Notes (Signed)
 [place] EMERGENCY DEPARTMENT AT [facility] Provider Note   CSN: [id number] Arrival date & time: 08/09/23  9077     Patient presents with: Fall and Toe Injury   [name] [name] is a 83 y.o. male.   Patient is a 83 year old male who presents to Emergency Department with a chief complaint of pain to his left great toe.  Patient notes that yesterday evening he slipped on a paper plate injuring the left great toe.  He denies any other secondary sites of injury or pain and he did not strike his head.  He denies any associated numbness or paresthesias.  He notes that he did pull the nail back and did have bleeding shortly thereafter.  He notes that he did cleanse the area with peroxide and cleaned it.   Fall       Prior to Admission medications   Medication Sig Start Date End Date Taking? Authorizing Provider  aspirin  81 MG tablet Take 81 mg by mouth daily.    [provider]  atorvastatin  (LIPITOR) 40 MG tablet TAKE 1 TABLET BY MOUTH AT BEDTIME 07/28/23   Duanne Butler DASEN, MD  Blood Glucose Monitoring Suppl DEVI 1 each by Does not apply route in the morning, at noon, and at bedtime. May substitute to any manufacturer covered by patient's insurance. 07/18/22   Duanne Butler DASEN, MD  buPROPion  (WELLBUTRIN  XL) 150 MG 24 hr tablet TAKE 1 TABLET BY MOUTH ONCE DAILY 03/31/23   Duanne Butler DASEN, MD  cyanocobalamin  (VITAMIN B12) 1000 MCG tablet Take 1 tablet (1,000 mcg total) by mouth daily. 02/24/22   Duanne Butler DASEN, MD  donepezil  (ARICEPT ) 5 MG tablet TAKE 1 TABLET BY MOUTH EVERY DAY AT BEDTIME 03/31/23   Duanne Butler DASEN, MD  folic acid  (FOLVITE ) 1 MG tablet TAKE 1 TABLET BY MOUTH ONCE DAILY 06/26/23   Duanne Butler DASEN, MD  furosemide  (LASIX ) 20 MG tablet TAKE 1 TABLET BY MOUTH DAILY 03/31/23   Duanne Butler DASEN, MD  JANUVIA  100 MG tablet TAKE 1 TABLET BY MOUTH ONCE DAILY 03/31/23   Duanne Butler DASEN, MD  lisinopril  (ZESTRIL ) 2.5 MG tablet TAKE 1 TABLET BY MOUTH ONCE DAILY  07/28/23   Duanne Butler DASEN, MD  metFORMIN  (GLUCOPHAGE ) 500 MG tablet TAKE 2 TABLETS BY MOUTH AT BREAKFAST AND TAKE 2 TABLETS AT BEDTIME 07/28/23   Duanne Butler DASEN, MD  metoprolol  tartrate (LOPRESSOR ) 50 MG tablet TAKE 1 TABLET BY MOUTH AT BREAKFAST AND TAKE 1 TABLET AT BEDTIME 07/28/23   Duanne Butler DASEN, MD  nitroGLYCERIN  (NITROSTAT ) 0.4 MG SL tablet Place 1 tablet (0.4 mg total) under the tongue every 5 (five) minutes x 3 doses as needed for chest pain. 02/24/22   Duanne Butler DASEN, MD  omeprazole  (PRILOSEC) 20 MG capsule TAKE 1 CAPSULE BY MOUTH AT BREAKFAST AND TAKE 1 CAPSULE AT BEDTIME 07/28/23   Duanne Butler DASEN, MD  terazosin  (HYTRIN ) 5 MG capsule TAKE 1 CAPSULE BY MOUTH EVERY DAY AT BEDTIME 12/29/22   Duanne Butler DASEN, MD  venlafaxine  XR (EFFEXOR -XR) 75 MG 24 hr capsule TAKE 1 CAPSULE BY MOUTH ONCE DAILY 07/28/23   Duanne Butler DASEN, MD    Allergies: Patient has no known allergies.    Review of Systems  Musculoskeletal:        Pain to left great toe  All other systems reviewed and are negative.   Updated Vital Signs BP (!) 154/62 (BP Location: Right Arm)   Pulse 70   Temp 97.9 F (  36.6 C) (Oral)   Resp 17   Ht 5' 10 (1.778 m)   Wt 81.6 kg   SpO2 98%   BMI 25.83 kg/m   Physical Exam Vitals and nursing note reviewed.  Constitutional:      Appearance: Normal appearance.  HENT:     Head: Normocephalic and atraumatic.  Eyes:     Extraocular Movements: Extraocular movements intact.     Conjunctiva/sclera: Conjunctivae normal.     Pupils: Pupils are equal, round, and reactive to light.  Cardiovascular:     Rate and Rhythm: Normal rate and regular rhythm.     Pulses: Normal pulses.     Heart sounds: Normal heart sounds.  Pulmonary:     Effort: Pulmonary effort is normal. No respiratory distress.  Musculoskeletal:        General: Normal range of motion.     Cervical back: Normal range of motion and neck supple.     Comments: Tenderness palpation noted of the left great  toe, nail of left great toe with dried blood with apparent disruption from the nailbed, full range of motion noted, nontender palpation of remainder of distal digits and left foot, DP and PT pulses are 2+, sensation intact distally, cap refill less than 2 seconds distally, no obvious deformity, no other lacerations or abrasions  Skin:    General: Skin is warm and dry.  Neurological:     General: No focal deficit present.     Mental Status: He is alert and oriented to person, place, and time. Mental status is at baseline.  Psychiatric:        Mood and Affect: Mood normal.        Behavior: Behavior normal.        Thought Content: Thought content normal.        Judgment: Judgment normal.     (all labs ordered are listed, but only abnormal results are displayed) Labs Reviewed - No data to display  EKG: None  Radiology: No results found.   Procedures   Medications Ordered in the ED - No data to display                                  Medical Decision Making Patient is doing well at this time and is stable for discharge home.  Discussed with patient and son that x-rays demonstrated no signs of acute osseous injury to the left great toe.  Will leave the toenail in place to allow secondary healing and to allow for the new toenail to grow out.  There is no active bleeding at this point.  He has no indication for cellulitis or abscess relation.  There are no gangrenous changes to the toe.  Given the patient's history of diabetes we will cover with a short course of antibiotics as well.  Continue good wound care on an outpatient basis was discussed.  Strict turn precautions were discussed as well for any new or worsening symptoms.  Patient and son voiced understanding and had no additional questions.  Did discuss the need for close follow-up with primary care doctor on outpatient basis for continued evaluation.  Amount and/or Complexity of Data Reviewed Radiology: ordered.  Risk OTC  drugs.        Final diagnoses:  None    ED Discharge Orders     None          Daralene Lonni JONETTA DEVONNA 08/09/23  [id number]    Suzette Pac, MD 08/09/23 [phone]

## 2023-08-18 ENCOUNTER — Ambulatory Visit: Admitting: Family Medicine

## 2023-08-18 ENCOUNTER — Encounter: Payer: Self-pay | Admitting: Family Medicine

## 2023-08-18 VITALS — BP 120/72 | HR 72 | Temp 98.1°F | Ht 70.0 in | Wt 176.0 lb

## 2023-08-18 DIAGNOSIS — Z7984 Long term (current) use of oral hypoglycemic drugs: Secondary | ICD-10-CM | POA: Diagnosis not present

## 2023-08-18 DIAGNOSIS — E1149 Type 2 diabetes mellitus with other diabetic neurological complication: Secondary | ICD-10-CM
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
## 2023-08-19 LAB — COMPREHENSIVE METABOLIC PANEL WITH GFR
AG Ratio: 1 (calc) (ref 1.0–2.5)
ALT: 44 U/L (ref 9–46)
AST: 51 U/L — ABNORMAL HIGH (ref 10–35)
Albumin: 3.8 g/dL (ref 3.6–5.1)
Alkaline phosphatase (APISO): 281 U/L — ABNORMAL HIGH (ref 35–144)
BUN: 17 mg/dL (ref 7–25)
CO2: 28 mmol/L (ref 20–32)
Calcium: 9.4 mg/dL (ref 8.6–10.3)
Chloride: 95 mmol/L — ABNORMAL LOW (ref 98–110)
Creat: 0.76 mg/dL (ref 0.70–1.22)
Globulin: 3.8 g/dL — ABNORMAL HIGH (ref 1.9–3.7)
Glucose, Bld: 201 mg/dL — ABNORMAL HIGH (ref 65–99)
Potassium: 4.7 mmol/L (ref 3.5–5.3)
Sodium: 131 mmol/L — ABNORMAL LOW (ref 135–146)
Total Bilirubin: 1.5 mg/dL — ABNORMAL HIGH (ref 0.2–1.2)
Total Protein: 7.6 g/dL (ref 6.1–8.1)
eGFR: 89 mL/min/1.73m2 (ref >=60)

## 2023-08-19 LAB — CBC WITH DIFFERENTIAL/PLATELET
Absolute Lymphocytes: 681 {cells}/uL — ABNORMAL LOW (ref 850–3900)
Absolute Monocytes: 681 {cells}/uL (ref 200–950)
Basophils Absolute: 30 {cells}/uL (ref 0–200)
Basophils Relative: 0.4 %
Eosinophils Absolute: 59 {cells}/uL (ref 15–500)
Eosinophils Relative: 0.8 %
HCT: 33.8 % — ABNORMAL LOW (ref 38.5–50.0)
Hemoglobin: 10.7 g/dL — ABNORMAL LOW (ref 13.2–17.1)
MCH: 27.9 pg (ref 27.0–33.0)
MCHC: 31.7 g/dL — ABNORMAL LOW (ref 32.0–36.0)
MCV: 88 fL (ref 80.0–100.0)
MPV: 13.1 fL — ABNORMAL HIGH (ref 7.5–12.5)
Monocytes Relative: 9.2 %
Neutro Abs: 5950 {cells}/uL (ref 1500–7800)
Neutrophils Relative %: 80.4 %
Platelets: 118 Thousand/uL — ABNORMAL LOW (ref 140–400)
RBC: 3.84 Million/uL — ABNORMAL LOW (ref 4.20–5.80)
RDW: 13.6 % (ref 11.0–15.0)
Total Lymphocyte: 9.2 %
WBC: 7.4 Thousand/uL (ref 3.8–10.8)

## 2023-08-19 LAB — HEMOGLOBIN A1C
Hgb A1c MFr Bld: 9.1 % — ABNORMAL HIGH (ref <5.7)
Mean Plasma Glucose: 214 mg/dL
eAG (mmol/L): 11.9 mmol/L

## 2023-08-20 ENCOUNTER — Ambulatory Visit: Payer: Self-pay | Admitting: Family Medicine

## 2023-08-21 NOTE — Progress Notes (Signed)
 No answer or contact made . Lvm for call back. Also sent result letter out to pt. Home address on file.

## 2023-08-25 ENCOUNTER — Ambulatory Visit: Payer: Self-pay

## 2023-08-25 ENCOUNTER — Other Ambulatory Visit: Payer: Self-pay

## 2023-08-25 MED ORDER — PIOGLITAZONE HCL 30 MG PO TABS: 30.0000 mg | ORAL_TABLET | Freq: Every day | ORAL | 1 refills | Status: DC

## 2023-08-25 NOTE — Telephone Encounter (Signed)
 FYI Only or Action Required?: FYI only for provider.  Patient was last seen in primary care on 08/18/2023 by Duanne Butler DASEN, MD.  Called Nurse Triage reporting Urinary Symptoms.  Symptoms began 2-3 days ago.  Interventions attempted: Nothing.  Symptoms are: unchanged.  Triage Disposition: See PCP Within 2 Weeks  Patient/caregiver understands and will follow disposition?: Yes    Copied from CRM [id number]. Topic: Clinical - Red Word Triage >> Aug 25, 2023 12:39 PM DeAngela L wrote: Red Word that prompted transfer to Nurse Triage: daughter Rollene calling to report her dad states he has a UTI patients is has orange color urine Pt num [phone]     Answer Assessment - Initial Assessment Questions 1. SYMPTOM: What's the main symptom you're concerned about? (e.g., frequency, incontinence)     Discolored urine, orange in color 2. ONSET: When did the discoloration start?     2-3 days ago  3. PAIN: Is there any pain? If Yes, ask: How bad is it? (Scale: 1-10; mild, moderate, severe)     No 4. CAUSE: What do you think is causing the symptoms?     Unsure, possible UTI 5. OTHER SYMPTOMS: Do you have any other symptoms? (e.g., blood in urine, fever, flank pain, pain with urination)     No  Protocols used: Urinary Symptoms-A-AH  Reason for Disposition  All other urine symptoms  Answer Assessment - Initial Assessment Questions 1. SYMPTOM: What's the main symptom you're concerned about? (e.g., frequency, incontinence)     Discolored urine, orange in color 2. ONSET: When did the discoloration start?     2-3 days ago  3. PAIN: Is there any pain? If Yes, ask: How bad is it? (Scale: 1-10; mild, moderate, severe)     No 4. CAUSE: What do you think is causing the symptoms?     Unsure, possible UTI 5. OTHER SYMPTOMS: Do you have any other symptoms? (e.g., blood in urine, fever, flank pain, pain with urination)     No  Protocols used: Urinary  Symptoms-A-AH

## 2023-08-27 ENCOUNTER — Encounter: Payer: Self-pay | Admitting: Family Medicine

## 2023-08-27 ENCOUNTER — Ambulatory Visit (INDEPENDENT_AMBULATORY_CARE_PROVIDER_SITE_OTHER): Admitting: Family Medicine

## 2023-08-27 VITALS — BP 128/78 | HR 88 | Temp 97.5°F | Ht 70.0 in | Wt 171.2 lb

## 2023-08-27 DIAGNOSIS — R3989 Other symptoms and signs involving the genitourinary system: Secondary | ICD-10-CM

## 2023-08-27 DIAGNOSIS — R7989 Other specified abnormal findings of blood chemistry: Secondary | ICD-10-CM

## 2023-08-27 LAB — URINALYSIS, ROUTINE W REFLEX MICROSCOPIC
Bilirubin Urine: NEGATIVE
Glucose, UA: NEGATIVE
Hgb urine dipstick: NEGATIVE
Hyaline Cast: NONE SEEN /LPF
Ketones, ur: NEGATIVE
Nitrite: NEGATIVE
Protein, ur: NEGATIVE
RBC / HPF: NONE SEEN /HPF (ref 0–2)
Specific Gravity, Urine: 1.02 (ref 1.001–1.035)
pH: 5.5 (ref 5.0–8.0)

## 2023-08-27 LAB — MICROSCOPIC MESSAGE

## 2023-08-27 MED ORDER — CEPHALEXIN 500 MG PO CAPS: 500.0000 mg | ORAL_CAPSULE | Freq: Three times a day (TID) | ORAL | 0 refills | Status: DC
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
## 2023-08-29 LAB — URINE CULTURE
MICRO NUMBER:: 16741484
SPECIMEN QUALITY:: ADEQUATE

## 2023-09-01 ENCOUNTER — Ambulatory Visit: Payer: Self-pay | Admitting: Family Medicine

## 2023-09-01 ENCOUNTER — Ambulatory Visit
Admission: RE | Admit: 2023-09-01 | Discharge: 2023-09-01 | Disposition: A | Discharge status: Home / Self Care | Source: Ambulatory Visit | Attending: Family Medicine | Admitting: Family Medicine

## 2023-09-01 DIAGNOSIS — R3989 Other symptoms and signs involving the genitourinary system: Secondary | ICD-10-CM

## 2023-09-01 DIAGNOSIS — R7989 Other specified abnormal findings of blood chemistry: Secondary | ICD-10-CM

## 2023-09-25 ENCOUNTER — Ambulatory Visit (INDEPENDENT_AMBULATORY_CARE_PROVIDER_SITE_OTHER): Payer: Medicare Other

## 2023-09-25 ENCOUNTER — Telehealth: Payer: Self-pay

## 2023-09-25 DIAGNOSIS — I495 Sick sinus syndrome: Secondary | ICD-10-CM

## 2023-09-25 LAB — CUP PACEART REMOTE DEVICE CHECK
Battery Remaining Longevity: 128 mo
Battery Voltage: 3 V
Brady Statistic AP VP Percent: 0.03 %
Brady Statistic AP VS Percent: 53.92 %
Brady Statistic AS VP Percent: 0.04 %
Brady Statistic AS VS Percent: 45.96 %
Brady Statistic RA Percent Paced: 34.27 %
Brady Statistic RV Percent Paced: 16.73 %
Date Time Interrogation Session: 20250822055555
Implantable Pulse Generator Implant Date: 20220420
Lead Channel Impedance Value: 304 Ohm
Lead Channel Impedance Value: 342 Ohm
Lead Channel Impedance Value: 570 Ohm
Lead Channel Impedance Value: 646 Ohm
Lead Channel Pacing Threshold Amplitude: 0.5 V
Lead Channel Pacing Threshold Amplitude: 1.375 V
Lead Channel Pacing Threshold Pulse Width: 0.4 ms
Lead Channel Pacing Threshold Pulse Width: 0.4 ms
Lead Channel Sensing Intrinsic Amplitude: 0.875 mV
Lead Channel Sensing Intrinsic Amplitude: 0.875 mV
Lead Channel Sensing Intrinsic Amplitude: 7.625 mV
Lead Channel Sensing Intrinsic Amplitude: 7.625 mV
Lead Channel Setting Pacing Amplitude: 1.5 V
Lead Channel Setting Pacing Amplitude: 2.75 V
Lead Channel Setting Pacing Pulse Width: 0.4 ms
Lead Channel Setting Sensing Sensitivity: 1.2 mV
Zone Setting Status: 755011
Zone Setting Status: 755011

## 2023-09-25 NOTE — Telephone Encounter (Signed)
 PPM Scheduled remote reviewed. Normal device function.  Presenting rhythm: AFL, VP Persistent AFL since 08/22/23, no OAC per EPIC, burden 37.4%, (appears 100% ongoing since 7/19).   Spoke with daughter Jessie) okay per DPR.  Informed her that father has been in AF since July 19th, with periods of elevated heart rates at time during episodes.  She states he has been reporting significant fatigue over past month and has had some falls.  Discussed AFIB and risk for CVA and need to start an OAC.  Appointment made with Afib clinic on 8/26 at 1030am with Daril Kicks PA to initiate OAC therapy.  ER precautions given if patient has severe sx's before appt.  Daughter verbalizes understanding and thanks me for the call.

## 2023-09-27 ENCOUNTER — Ambulatory Visit: Payer: Self-pay | Admitting: Internal Medicine

## 2023-09-29 ENCOUNTER — Ambulatory Visit (HOSPITAL_COMMUNITY)
Admission: RE | Admit: 2023-09-29 | Discharge: 2023-09-29 | Disposition: A | Discharge status: Home / Self Care | Source: Ambulatory Visit | Attending: Physician Assistant | Admitting: Physician Assistant

## 2023-09-29 VITALS — BP 118/60 | HR 65 | Ht 70.0 in | Wt 172.0 lb

## 2023-09-29 DIAGNOSIS — I4891 Unspecified atrial fibrillation: Secondary | ICD-10-CM | POA: Diagnosis not present

## 2023-09-29 DIAGNOSIS — I4819 Other persistent atrial fibrillation: Secondary | ICD-10-CM

## 2023-09-29 DIAGNOSIS — D6869 Other thrombophilia: Secondary | ICD-10-CM

## 2023-09-29 MED ORDER — APIXABAN 5 MG PO TABS: 5.0000 mg | ORAL_TABLET | Freq: Two times a day (BID) | ORAL | 3 refills | Status: DC

## 2023-09-29 NOTE — Patient Instructions (Addendum)
 Start Eliquis 5mg  twice a day

## 2023-09-29 NOTE — Progress Notes (Signed)
 Primary Care Physician: Duanne Butler DASEN, MD Primary Cardiologist: None Electrophysiologist: Danelle Birmingham, MD  Referring Physician: Device clinic/Dr Salmaan Patchin is a 83 y.o. male with a history of CAD, HTN, HLD, hepatocellular carcinoma with cirrhosis, DM, atrial fibrillation who presents for follow up in the [facility].  The patient was noted by the device clinic to be persistently in afib since 08/22/23. He has felt more fatigued and dizzy in the last several weeks. There were no specific triggers that he could identify. He does have a h/o falls but not head trauma.    Patient presents today for follow up for atrial fibrillation. He continues to feel fatigued today. He is scheduled for another MRI of his liver next month.   Today, he denies symptoms of palpitations, chest pain, shortness of breath, orthopnea, PND, lower extremity edema, dizziness, presyncope, syncope, snoring, daytime somnolence, bleeding, or neurologic sequela. The patient is tolerating medications without difficulties and is otherwise without complaint today.    Atrial Fibrillation Risk Factors:  he does not have symptoms or diagnosis of sleep apnea. he does not have a history of rheumatic fever.   Atrial Fibrillation Management history:  Previous antiarrhythmic drugs: none Previous cardioversions: none Previous ablations: none Anticoagulation history: none  ROS- All systems are reviewed and negative except as per the HPI above.  Past Medical History:  Diagnosis Date   Anemia, chronic disease 10/07/2012   SEP 2014 HB 13 MV >90 PLT 99    Cataract    Dementia (HCC)    Diabetes mellitus without complication (HCC)    H. 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 60 tablet 3   aspirin  81 MG tablet Take 81 mg by mouth daily.     atorvastatin  (LIPITOR) 40 MG tablet TAKE 1 TABLET BY MOUTH AT BEDTIME 90 tablet 11   bacitracin  ointment Apply 1 Application topically 2 (two) times daily. 120 g 0   Blood Glucose Monitoring Suppl DEVI 1 each by Does not apply route in the morning, at noon, and at bedtime. May substitute to any manufacturer covered by patient's insurance. 1 each 0   buPROPion  (WELLBUTRIN  XL) 150 MG 24 hr tablet TAKE 1 TABLET BY MOUTH ONCE DAILY 30 tablet 10   cyanocobalamin  (VITAMIN B12) 1000 MCG tablet Take 1 tablet (1,000 mcg total) by mouth daily. 90 tablet 3   donepezil  (ARICEPT ) 5 MG tablet TAKE 1 TABLET BY MOUTH EVERY DAY AT BEDTIME 30 tablet 10   folic acid  (FOLVITE ) 1 MG tablet TAKE 1 TABLET BY MOUTH ONCE DAILY 90 tablet 1   JANUVIA  100 MG tablet TAKE 1 TABLET BY MOUTH ONCE DAILY 30 tablet 10   lisinopril  (ZESTRIL ) 2.5 MG tablet TAKE 1 TABLET BY MOUTH ONCE DAILY 90 tablet 11   metFORMIN  (GLUCOPHAGE ) 500 MG tablet TAKE 2  TABLETS BY MOUTH AT BREAKFAST AND TAKE 2 TABLETS AT BEDTIME 360 tablet 11   metoprolol  tartrate (LOPRESSOR ) 50 MG tablet TAKE 1 TABLET BY MOUTH AT BREAKFAST AND TAKE 1 TABLET AT BEDTIME 180 tablet 11   nitroGLYCERIN  (NITROSTAT ) 0.4 MG SL tablet Place 1 tablet (0.4 mg total) under the tongue every 5 (five) minutes x 3 doses as needed for chest pain. 25 tablet 4   omeprazole  (PRILOSEC) 20 MG capsule TAKE 1 CAPSULE BY MOUTH AT BREAKFAST AND TAKE 1 CAPSULE AT BEDTIME 180 capsule 11   pioglitazone  (ACTOS ) 30 MG tablet Take 1 tablet (30 mg total) by mouth daily. 90 tablet 1    terazosin  (HYTRIN ) 5 MG capsule TAKE 1 CAPSULE BY MOUTH EVERY DAY AT BEDTIME 30 capsule 10   venlafaxine  XR (EFFEXOR -XR) 75 MG 24 hr capsule TAKE 1 CAPSULE BY MOUTH ONCE DAILY 90 capsule 11   furosemide  (LASIX ) 20 MG tablet TAKE 1 TABLET BY MOUTH DAILY (Patient not taking: Reported on 09/29/2023) 30 tablet 10   No current facility-administered medications for this encounter.    Physical Exam: BP 118/60   Pulse 65   Ht 5' 10 (1.778 m)   Wt 78 kg   BMI 24.68 kg/m   GEN: Well nourished, well developed in no acute distress NECK: No JVD CARDIAC: Regular rate and rhythm, no murmurs, rubs, gallops RESPIRATORY:  Clear to auscultation without rales, wheezing or rhonchi  ABDOMEN: Soft, non-tender, non-distended EXTREMITIES:  No edema; No deformity   Wt Readings from Last 3 Encounters:  09/29/23 78 kg  08/27/23 77.7 kg  08/18/23 79.8 kg     EKG today demonstrates  V pacing with underlying afib Vent. rate 65 BPM PR interval * ms QRS duration 172 ms QT/QTcB 462/480 ms   Echo 10/13/22 demonstrated   1. Left ventricular ejection fraction, by estimation, is 60 to 65%. The  left ventricle has normal function. The left ventricle has no regional  wall motion abnormalities. Left ventricular diastolic parameters are  indeterminate.   2. Right ventricular systolic function is normal. The right ventricular  size is normal. Tricuspid regurgitation signal is inadequate for assessing  PA pressure.   3. Left atrial size was moderately dilated.   4. The mitral valve is normal in structure. Trivial mitral valve  regurgitation. No evidence of mitral stenosis.   5. The tricuspid valve is abnormal.   6. The aortic valve is tricuspid. Aortic valve regurgitation is not  visualized. No aortic stenosis is present.   7. Aortic dilatation noted. There is mild dilatation of the aortic root,  measuring 41 mm.   8. The inferior vena cava is normal in size with greater than 50%  respiratory variability,  suggesting right atrial pressure of 3 mmHg.    CHA2DS2-VASc Score = 4  The patient's score is based upon: CHF History: 0 HTN History: 1 Diabetes History: 0 Stroke History: 0 Vascular Disease History: 1 Age Score: 2 Gender Score: 0       ASSESSMENT AND PLAN: Persistent Atrial Fibrillation (ICD10:  I48.19) The patient's CHA2DS2-VASc score is 4, indicating a 4.8% annual risk of stroke.   General education about afib provided and questions answered. We also discussed his stroke risk and the risks and benefits of anticoagulation. He does have a h/o liver cirrhosis, falls, and anemia. However, he would like a trial of SR. Will start Eliquis  5 mg BID. If he tolerates anticoagulation, can consider DCCV after >3 weeks. Will be ongoing discussion about risk/benefit of  anticoagulation given his comorbidities.  Continue Lopressor  50 mg BID  Secondary Hypercoagulable State (ICD10:  D68.69) The patient is at significant risk for stroke/thromboembolism based upon his CHA2DS2-VASc Score of 4.  Start Apixaban  (Eliquis ).   CAD H/o PCI 2009 No anginal symptoms  HTN Stable on current regimen   Follow up in the AF clinic in 3 weeks.     [facility] [address] [place], [place] [address] [phone]

## 2023-10-16 ENCOUNTER — Telehealth: Payer: Self-pay

## 2023-10-16 NOTE — Telephone Encounter (Signed)
 Pt's daughter came in to office to get ppw  for aging and disability completed by pcp. Pt's daughter completed an intake form, intake form and forms to completed placed in nurse's folder for completion. Pt's daughter stated to please fax forms once completed to number provided on form. Please advise.  Cb#: [phone]

## 2023-10-21 ENCOUNTER — Ambulatory Visit (HOSPITAL_COMMUNITY)
Admission: RE | Admit: 2023-10-21 | Discharge: 2023-10-21 | Disposition: A | Discharge status: Home / Self Care | Source: Ambulatory Visit | Attending: Physician Assistant | Admitting: Physician Assistant

## 2023-10-21 ENCOUNTER — Encounter (HOSPITAL_COMMUNITY): Payer: Self-pay | Admitting: Physician Assistant

## 2023-10-21 VITALS — BP 90/70 | HR 76 | Ht 70.0 in | Wt 163.2 lb

## 2023-10-21 DIAGNOSIS — I4819 Other persistent atrial fibrillation: Secondary | ICD-10-CM | POA: Diagnosis not present

## 2023-10-21 DIAGNOSIS — I480 Paroxysmal atrial fibrillation: Secondary | ICD-10-CM | POA: Diagnosis not present

## 2023-10-21 DIAGNOSIS — D6869 Other thrombophilia: Secondary | ICD-10-CM

## 2023-10-21 MED ORDER — APIXABAN 5 MG PO TABS: 5.0000 mg | ORAL_TABLET | Freq: Two times a day (BID) | ORAL | 5 refills | Status: AC

## 2023-10-21 NOTE — H&P (View-Only) (Signed)
 Primary Care Physician: Duanne Butler DASEN, MD Primary Cardiologist: None Electrophysiologist: Danelle Birmingham, MD  Referring Physician: Device clinic/Dr Lunden Mcleish is a 83 y.o. male with a history of CAD, HTN, HLD, hepatocellular carcinoma with cirrhosis, DM, atrial fibrillation who presents for follow up in the [facility].  The patient was noted by the device clinic to be persistently in afib since 08/22/23. He has felt more fatigued and dizzy. There were no specific triggers that he could identify. He does have a h/o falls but not head trauma.    Patient returns for follow up for atrial fibrillation. He remains in rate controlled afib today feeling fatigued and intermittently dizzy. He moved in with his daughter and she confirms he has not missed any doses of anticoagulation since 9/8. No bleeding issues.   Today, he  denies symptoms of palpitations, chest pain, shortness of breath, orthopnea, PND, lower extremity edema, presyncope, syncope, snoring, daytime somnolence, bleeding, or neurologic sequela. The patient is tolerating medications without difficulties and is otherwise without complaint today.    Atrial Fibrillation Risk Factors:  he does not have symptoms or diagnosis of sleep apnea. he does not have a history of rheumatic fever.   Atrial Fibrillation Management history:  Previous antiarrhythmic drugs: none Previous cardioversions: none Previous ablations: none Anticoagulation history: Eliquis   ROS- All systems are reviewed and negative except as per the HPI above.  Past Medical History:  Diagnosis Date   Anemia, chronic disease 10/07/2012   SEP 2014 HB 13 MV >90 PLT 99    Cataract    Dementia (HCC)    Diabetes mellitus without complication (HCC)    H. 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     atorvastatin  (LIPITOR) 40 MG tablet TAKE 1 TABLET BY MOUTH AT BEDTIME 90 tablet 11   bacitracin  ointment Apply 1 Application topically 2 (two) times daily. 120 g 0   Blood Glucose Monitoring Suppl DEVI 1 each by Does not apply route in the morning, at noon, and at bedtime. May substitute to any manufacturer covered by patient's insurance. 1 each 0   buPROPion  (WELLBUTRIN  XL) 150 MG 24 hr tablet TAKE 1 TABLET BY MOUTH ONCE DAILY 30 tablet 10   cyanocobalamin  (VITAMIN B12) 1000 MCG tablet Take 1 tablet (1,000 mcg total) by mouth daily. 90 tablet 3   donepezil  (ARICEPT ) 5 MG tablet TAKE 1 TABLET BY MOUTH EVERY DAY AT BEDTIME 30 tablet 10   folic acid  (FOLVITE ) 1 MG tablet TAKE 1 TABLET BY MOUTH ONCE DAILY 90 tablet 1   JANUVIA  100 MG tablet TAKE 1 TABLET BY MOUTH ONCE DAILY 30 tablet 10   lisinopril  (ZESTRIL ) 2.5 MG tablet TAKE 1 TABLET BY MOUTH ONCE DAILY 90 tablet 11   metFORMIN  (GLUCOPHAGE ) 500 MG tablet TAKE 2 TABLETS BY MOUTH AT BREAKFAST AND TAKE 2 TABLETS AT BEDTIME 360  tablet 11   metoprolol  tartrate (LOPRESSOR ) 50 MG tablet TAKE 1 TABLET BY MOUTH AT BREAKFAST AND TAKE 1 TABLET AT BEDTIME 180 tablet 11   nitroGLYCERIN  (NITROSTAT ) 0.4 MG SL tablet Place 1 tablet (0.4 mg total) under the tongue every 5 (five) minutes x 3 doses as needed for chest pain. 25 tablet 4   omeprazole  (PRILOSEC) 20 MG capsule TAKE 1 CAPSULE BY MOUTH AT BREAKFAST AND TAKE 1 CAPSULE AT BEDTIME 180 capsule 11   pioglitazone  (ACTOS ) 30 MG tablet Take 1 tablet (30 mg total) by mouth daily. 90 tablet 1   terazosin  (HYTRIN ) 5 MG  capsule TAKE 1 CAPSULE BY MOUTH EVERY DAY AT BEDTIME 30 capsule 10   venlafaxine  XR (EFFEXOR -XR) 75 MG 24 hr capsule TAKE 1 CAPSULE BY MOUTH ONCE DAILY 90 capsule 11   apixaban  (ELIQUIS ) 5 MG TABS tablet Take 1 tablet (5 mg total) by mouth 2 (two) times daily. 60 tablet 5   furosemide  (LASIX ) 20 MG tablet TAKE 1 TABLET BY MOUTH DAILY (Patient not taking: Reported on 10/21/2023) 30 tablet 10   No current facility-administered medications for this encounter.    Physical Exam: BP 90/70   Pulse 76   Ht 5' 10 (1.778 m)   Wt 74 kg   BMI 23.42 kg/m   GEN: Well nourished, well developed in no acute distress CARDIAC: Irregularly irregular rate and rhythm, no murmurs, rubs, gallops RESPIRATORY:  Clear to auscultation without rales, wheezing or rhonchi  ABDOMEN: Soft, non-tender, non-distended EXTREMITIES:  No edema; No deformity    Wt Readings from Last 3 Encounters:  10/21/23 74 kg  09/29/23 78 kg  08/27/23 77.7 kg     EKG today demonstrates  Afib Vent. rate 76 BPM PR interval * ms QRS duration 78 ms QT/QTcB 360/405 ms   Echo 10/13/22 demonstrated   1. Left ventricular ejection fraction, by estimation, is 60 to 65%. The  left ventricle has normal function. The left ventricle has no regional  wall motion abnormalities. Left ventricular diastolic parameters are  indeterminate.   2. Right ventricular systolic function is normal. The right ventricular  size is normal. Tricuspid regurgitation signal is inadequate for assessing  PA pressure.   3. Left atrial size was moderately dilated.   4. The mitral valve is normal in structure. Trivial mitral valve  regurgitation. No evidence of mitral stenosis.   5. The tricuspid valve is abnormal.   6. The aortic valve is tricuspid. Aortic valve regurgitation is not  visualized. No aortic stenosis is present.   7. Aortic dilatation noted. There is mild dilatation of the aortic root,  measuring 41 mm.   8. The inferior vena cava is normal in  size with greater than 50%  respiratory variability, suggesting right atrial pressure of 3 mmHg.    CHA2DS2-VASc Score = 4  The patient's score is based upon: CHF History: 0 HTN History: 1 Diabetes History: 0 Stroke History: 0 Vascular Disease History: 1 Age Score: 2 Gender Score: 0       ASSESSMENT AND PLAN: Persistent Atrial Fibrillation (ICD10:  I48.19) The patient's CHA2DS2-VASc score is 4, indicating a 4.8% annual risk of stroke.   Patient remains in rate controlled atrial flutter. We revisited rate vs rhythm control today. He would like a trial of SR to see if his fatigue and dizziness improved. Will arrange for DCCV after >3 weeks of anticoagulation. If he has quick return of afib, will revisit risk/benefit discussion given his comorbidities.  Continue Lopressor  50  mg BID Continue Eliquis  5 mg BID  Secondary Hypercoagulable State (ICD10:  D68.69) The patient is at significant risk for stroke/thromboembolism based upon his CHA2DS2-VASc Score of 4.  Continue Apixaban  (Eliquis ). Check bmet/cbc today.   CAD C/o PCI in 2009 No anginal symptoms  HTN Low-normal today, will reassess in SR.    Follow up in the AF clinic post DCCV.    Informed Consent   Shared Decision Making/Informed Consent The risks (stroke, cardiac arrhythmias rarely resulting in the need for a temporary or permanent pacemaker, skin irritation or burns and complications associated with conscious sedation including aspiration, arrhythmia, respiratory failure and death), benefits (restoration of normal sinus rhythm) and alternatives of a direct current cardioversion were explained in detail to Mr. Spirito and he agrees to proceed.       [facility] [address] [place], [place] [address] [phone]

## 2023-10-21 NOTE — Progress Notes (Signed)
 Remote PPM Transmission

## 2023-10-21 NOTE — Patient Instructions (Signed)
 Cardioversion scheduled for: Monday, September 29th   - Arrive at the [facility] ([address])  and check in with ADMITTING at 11:30 AM   - Do not eat or drink anything after midnight the night prior to your procedure.   - Take all your morning medication (except metformin  and januvia ) with a sip of water  prior to arrival.  - Do NOT miss any doses of your blood thinner - if you should miss a dose or take a dose more than 4 hours late -- please notify our office immediately.  - You will not be able to drive home after your procedure. Please ensure you have a responsible adult to drive you home. You will need someone with you for 24 hours post procedure.     - Expect to be in the procedural area approximately 2 hours.   - If you feel as if you go back into normal rhythm prior to scheduled cardioversion, please notify our office immediately.   If your procedure is canceled in the cardioversion suite you will be charged a cancellation fee.

## 2023-10-21 NOTE — Progress Notes (Signed)
 Primary Care Physician: Duanne Butler DASEN, MD Primary Cardiologist: None Electrophysiologist: Danelle Birmingham, MD  Referring Physician: Device clinic/Dr Lunden Mcleish is a 83 y.o. male with a history of CAD, HTN, HLD, hepatocellular carcinoma with cirrhosis, DM, atrial fibrillation who presents for follow up in the [facility].  The patient was noted by the device clinic to be persistently in afib since 08/22/23. He has felt more fatigued and dizzy. There were no specific triggers that he could identify. He does have a h/o falls but not head trauma.    Patient returns for follow up for atrial fibrillation. He remains in rate controlled afib today feeling fatigued and intermittently dizzy. He moved in with his daughter and she confirms he has not missed any doses of anticoagulation since 9/8. No bleeding issues.   Today, he  denies symptoms of palpitations, chest pain, shortness of breath, orthopnea, PND, lower extremity edema, presyncope, syncope, snoring, daytime somnolence, bleeding, or neurologic sequela. The patient is tolerating medications without difficulties and is otherwise without complaint today.    Atrial Fibrillation Risk Factors:  he does not have symptoms or diagnosis of sleep apnea. he does not have a history of rheumatic fever.   Atrial Fibrillation Management history:  Previous antiarrhythmic drugs: none Previous cardioversions: none Previous ablations: none Anticoagulation history: Eliquis   ROS- All systems are reviewed and negative except as per the HPI above.  Past Medical History:  Diagnosis Date   Anemia, chronic disease 10/07/2012   SEP 2014 HB 13 MV >90 PLT 99    Cataract    Dementia (HCC)    Diabetes mellitus without complication (HCC)    H. 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     atorvastatin  (LIPITOR) 40 MG tablet TAKE 1 TABLET BY MOUTH AT BEDTIME 90 tablet 11   bacitracin  ointment Apply 1 Application topically 2 (two) times daily. 120 g 0   Blood Glucose Monitoring Suppl DEVI 1 each by Does not apply route in the morning, at noon, and at bedtime. May substitute to any manufacturer covered by patient's insurance. 1 each 0   buPROPion  (WELLBUTRIN  XL) 150 MG 24 hr tablet TAKE 1 TABLET BY MOUTH ONCE DAILY 30 tablet 10   cyanocobalamin  (VITAMIN B12) 1000 MCG tablet Take 1 tablet (1,000 mcg total) by mouth daily. 90 tablet 3   donepezil  (ARICEPT ) 5 MG tablet TAKE 1 TABLET BY MOUTH EVERY DAY AT BEDTIME 30 tablet 10   folic acid  (FOLVITE ) 1 MG tablet TAKE 1 TABLET BY MOUTH ONCE DAILY 90 tablet 1   JANUVIA  100 MG tablet TAKE 1 TABLET BY MOUTH ONCE DAILY 30 tablet 10   lisinopril  (ZESTRIL ) 2.5 MG tablet TAKE 1 TABLET BY MOUTH ONCE DAILY 90 tablet 11   metFORMIN  (GLUCOPHAGE ) 500 MG tablet TAKE 2 TABLETS BY MOUTH AT BREAKFAST AND TAKE 2 TABLETS AT BEDTIME 360  tablet 11   metoprolol  tartrate (LOPRESSOR ) 50 MG tablet TAKE 1 TABLET BY MOUTH AT BREAKFAST AND TAKE 1 TABLET AT BEDTIME 180 tablet 11   nitroGLYCERIN  (NITROSTAT ) 0.4 MG SL tablet Place 1 tablet (0.4 mg total) under the tongue every 5 (five) minutes x 3 doses as needed for chest pain. 25 tablet 4   omeprazole  (PRILOSEC) 20 MG capsule TAKE 1 CAPSULE BY MOUTH AT BREAKFAST AND TAKE 1 CAPSULE AT BEDTIME 180 capsule 11   pioglitazone  (ACTOS ) 30 MG tablet Take 1 tablet (30 mg total) by mouth daily. 90 tablet 1   terazosin  (HYTRIN ) 5 MG  capsule TAKE 1 CAPSULE BY MOUTH EVERY DAY AT BEDTIME 30 capsule 10   venlafaxine  XR (EFFEXOR -XR) 75 MG 24 hr capsule TAKE 1 CAPSULE BY MOUTH ONCE DAILY 90 capsule 11   apixaban  (ELIQUIS ) 5 MG TABS tablet Take 1 tablet (5 mg total) by mouth 2 (two) times daily. 60 tablet 5   furosemide  (LASIX ) 20 MG tablet TAKE 1 TABLET BY MOUTH DAILY (Patient not taking: Reported on 10/21/2023) 30 tablet 10   No current facility-administered medications for this encounter.    Physical Exam: BP 90/70   Pulse 76   Ht 5' 10 (1.778 m)   Wt 74 kg   BMI 23.42 kg/m   GEN: Well nourished, well developed in no acute distress CARDIAC: Irregularly irregular rate and rhythm, no murmurs, rubs, gallops RESPIRATORY:  Clear to auscultation without rales, wheezing or rhonchi  ABDOMEN: Soft, non-tender, non-distended EXTREMITIES:  No edema; No deformity    Wt Readings from Last 3 Encounters:  10/21/23 74 kg  09/29/23 78 kg  08/27/23 77.7 kg     EKG today demonstrates  Afib Vent. rate 76 BPM PR interval * ms QRS duration 78 ms QT/QTcB 360/405 ms   Echo 10/13/22 demonstrated   1. Left ventricular ejection fraction, by estimation, is 60 to 65%. The  left ventricle has normal function. The left ventricle has no regional  wall motion abnormalities. Left ventricular diastolic parameters are  indeterminate.   2. Right ventricular systolic function is normal. The right ventricular  size is normal. Tricuspid regurgitation signal is inadequate for assessing  PA pressure.   3. Left atrial size was moderately dilated.   4. The mitral valve is normal in structure. Trivial mitral valve  regurgitation. No evidence of mitral stenosis.   5. The tricuspid valve is abnormal.   6. The aortic valve is tricuspid. Aortic valve regurgitation is not  visualized. No aortic stenosis is present.   7. Aortic dilatation noted. There is mild dilatation of the aortic root,  measuring 41 mm.   8. The inferior vena cava is normal in  size with greater than 50%  respiratory variability, suggesting right atrial pressure of 3 mmHg.    CHA2DS2-VASc Score = 4  The patient's score is based upon: CHF History: 0 HTN History: 1 Diabetes History: 0 Stroke History: 0 Vascular Disease History: 1 Age Score: 2 Gender Score: 0       ASSESSMENT AND PLAN: Persistent Atrial Fibrillation (ICD10:  I48.19) The patient's CHA2DS2-VASc score is 4, indicating a 4.8% annual risk of stroke.   Patient remains in rate controlled atrial flutter. We revisited rate vs rhythm control today. He would like a trial of SR to see if his fatigue and dizziness improved. Will arrange for DCCV after >3 weeks of anticoagulation. If he has quick return of afib, will revisit risk/benefit discussion given his comorbidities.  Continue Lopressor  50  mg BID Continue Eliquis  5 mg BID  Secondary Hypercoagulable State (ICD10:  D68.69) The patient is at significant risk for stroke/thromboembolism based upon his CHA2DS2-VASc Score of 4.  Continue Apixaban  (Eliquis ). Check bmet/cbc today.   CAD C/o PCI in 2009 No anginal symptoms  HTN Low-normal today, will reassess in SR.    Follow up in the AF clinic post DCCV.    Informed Consent   Shared Decision Making/Informed Consent The risks (stroke, cardiac arrhythmias rarely resulting in the need for a temporary or permanent pacemaker, skin irritation or burns and complications associated with conscious sedation including aspiration, arrhythmia, respiratory failure and death), benefits (restoration of normal sinus rhythm) and alternatives of a direct current cardioversion were explained in detail to [name] and he agrees to proceed.       [facility] [address] [place], [place] [address] [phone]

## 2023-10-22 ENCOUNTER — Ambulatory Visit: Payer: 59 | Admitting: *Deleted

## 2023-10-22 VITALS — Ht 70.0 in | Wt 163.0 lb

## 2023-10-22 DIAGNOSIS — Z0001 Encounter for general adult medical examination with abnormal findings: Secondary | ICD-10-CM | POA: Diagnosis not present

## 2023-10-22 DIAGNOSIS — I2510 Atherosclerotic heart disease of native coronary artery without angina pectoris: Secondary | ICD-10-CM

## 2023-10-22 DIAGNOSIS — Z9861 Coronary angioplasty status: Secondary | ICD-10-CM | POA: Diagnosis not present

## 2023-10-22 DIAGNOSIS — E1149 Type 2 diabetes mellitus with other diabetic neurological complication: Secondary | ICD-10-CM | POA: Diagnosis not present

## 2023-10-22 DIAGNOSIS — Z0000 Encounter for general adult medical examination without abnormal findings: Secondary | ICD-10-CM

## 2023-10-22 MED ORDER — NITROGLYCERIN 0.4 MG SL SUBL
0.4000 mg | SUBLINGUAL_TABLET | SUBLINGUAL | 4 refills | Status: AC | PRN
Start: 2023-10-22 — End: ?

## 2023-10-22 MED ORDER — LANCET DEVICE MISC: 1.0000 | Freq: Three times a day (TID) | 0 refills | Status: AC

## 2023-10-22 MED ORDER — BLOOD GLUCOSE MONITORING SUPPL DEVI: 1.0000 | Freq: Three times a day (TID) | 0 refills | Status: AC

## 2023-10-22 MED ORDER — LANCETS MISC. MISC
1.0000 | Freq: Three times a day (TID) | 0 refills | Status: AC
Start: 2023-10-22 — End: 2023-11-21

## 2023-10-22 MED ORDER — BLOOD GLUCOSE TEST VI STRP
1.0000 | ORAL_STRIP | Freq: Three times a day (TID) | 0 refills | Status: AC
Start: 2023-10-22 — End: 2023-11-21
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
## 2023-10-23 ENCOUNTER — Ambulatory Visit (HOSPITAL_COMMUNITY): Payer: Self-pay | Admitting: Physician Assistant

## 2023-10-23 LAB — BASIC METABOLIC PANEL WITH GFR
BUN/Creatinine Ratio: 25 — ABNORMAL HIGH (ref 10–24)
BUN: 25 mg/dL (ref 8–27)
CO2: 22 mmol/L (ref 20–29)
Calcium: 9.6 mg/dL (ref 8.6–10.2)
Chloride: 96 mmol/L (ref 96–106)
Creatinine, Ser: 1 mg/dL (ref 0.76–1.27)
Glucose: 162 mg/dL — ABNORMAL HIGH (ref 70–99)
Potassium: 4 mmol/L (ref 3.5–5.2)
Sodium: 135 mmol/L (ref 134–144)
eGFR: 75 mL/min/1.73 (ref >59)

## 2023-10-23 LAB — CBC
Hematocrit: 32.5 % — ABNORMAL LOW (ref 37.5–51.0)
Hemoglobin: 10.2 g/dL — ABNORMAL LOW (ref 13.0–17.7)
MCH: 28.5 pg (ref 26.6–33.0)
MCHC: 31.4 g/dL — ABNORMAL LOW (ref 31.5–35.7)
MCV: 91 fL (ref 79–97)
Platelets: 96 x10E3/uL — CL (ref 150–450)
RBC: 3.58 x10E6/uL — ABNORMAL LOW (ref 4.14–5.80)
RDW: 13.9 % (ref 11.6–15.4)
WBC: 4.1 x10E3/uL (ref 3.4–10.8)

## 2023-10-30 DIAGNOSIS — K7689 Other specified diseases of liver: Secondary | ICD-10-CM | POA: Diagnosis not present

## 2023-10-30 DIAGNOSIS — Z9889 Other specified postprocedural states: Secondary | ICD-10-CM | POA: Diagnosis not present

## 2023-10-30 DIAGNOSIS — Z950 Presence of cardiac pacemaker: Secondary | ICD-10-CM | POA: Diagnosis not present

## 2023-10-30 DIAGNOSIS — C220 Liver cell carcinoma: Secondary | ICD-10-CM | POA: Diagnosis not present

## 2023-10-30 NOTE — Progress Notes (Signed)
 Called patient with pre-procedure instructions for tomorrow. Spoke with pt's DPR Rollene (daughter).  Patient's DPR informed of:   Time to arrive for procedure (1130). Remain NPO past midnight.  Must have a ride home and a responsible adult to remain with them for 24 hours post procedure. Instructed to take am meds with sip of water  and confirmed blood thinner consistency.  Patient's DPR reports understanding and no other questions at this time.

## 2023-11-01 NOTE — Anesthesia Preprocedure Evaluation (Addendum)
 Anesthesia Evaluation  Patient identified by MRN, date of birth, ID band Patient awake    Reviewed: Allergy & Precautions, NPO status , Patient's Chart, lab work & pertinent test results, reviewed documented beta blocker date and time   History of Anesthesia Complications Negative for: history of anesthetic complications  Airway Mallampati: II  TM Distance: >3 FB Neck ROM: Full    Dental  (+) Missing,    Pulmonary former smoker   Pulmonary exam normal        Cardiovascular hypertension, Pt. on medications and Pt. on home beta blockers + CAD, + Past MI and + Cardiac Stents (2009)  Normal cardiovascular exam+ dysrhythmias (on Eliquis ) Atrial Fibrillation + pacemaker   TTE 2024: EF 60-65%, moderate LAE, mild aortic root dilation 41mm     Neuro/Psych    Depression   Dementia negative neurological ROS     GI/Hepatic negative GI ROS,,,(+) Cirrhosis       , Hepatitis -, C  Endo/Other  diabetes, Type 2, Oral Hypoglycemic Agents    Renal/GU negative Renal ROS     Musculoskeletal  (+) Arthritis ,    Abdominal   Peds  Hematology  (+) Blood dyscrasia (Hgb 10.2, Plt 96k), anemia   Anesthesia Other Findings   Reproductive/Obstetrics                              Anesthesia Physical Anesthesia Plan  ASA: 3  Anesthesia Plan: General   Post-op Pain Management: Minimal or no pain anticipated   Induction: Intravenous  PONV Risk Score and Plan: 2 and Treatment may vary due to age or medical condition and Propofol infusion  Airway Management Planned: Mask  Additional Equipment: None  Intra-op Plan:   Post-operative Plan:   Informed Consent: I have reviewed the patients History and Physical, chart, labs and discussed the procedure including the risks, benefits and alternatives for the proposed anesthesia with the patient or authorized representative who has indicated his/her understanding and  acceptance.       Plan Discussed with: CRNA  Anesthesia Plan Comments:          Anesthesia Quick Evaluation

## 2023-11-02 ENCOUNTER — Encounter (HOSPITAL_COMMUNITY): Payer: Self-pay | Admitting: Internal Medicine

## 2023-11-02 ENCOUNTER — Ambulatory Visit (HOSPITAL_COMMUNITY)
Admission: RE | Admit: 2023-11-02 | Discharge: 2023-11-02 | Disposition: A | Discharge status: Home / Self Care | Attending: Internal Medicine | Admitting: Internal Medicine

## 2023-11-02 ENCOUNTER — Ambulatory Visit (HOSPITAL_BASED_OUTPATIENT_CLINIC_OR_DEPARTMENT_OTHER): Admitting: Anesthesiology

## 2023-11-02 ENCOUNTER — Ambulatory Visit (HOSPITAL_COMMUNITY): Admitting: Anesthesiology

## 2023-11-02 ENCOUNTER — Encounter (HOSPITAL_COMMUNITY)
Admission: RE | Disposition: A | Discharge status: Home / Self Care | Payer: Self-pay | Source: Home / Self Care | Attending: Internal Medicine

## 2023-11-02 ENCOUNTER — Other Ambulatory Visit: Payer: Self-pay

## 2023-11-02 DIAGNOSIS — I4819 Other persistent atrial fibrillation: Secondary | ICD-10-CM | POA: Diagnosis not present

## 2023-11-02 DIAGNOSIS — Z9861 Coronary angioplasty status: Secondary | ICD-10-CM | POA: Diagnosis not present

## 2023-11-02 DIAGNOSIS — Z87891 Personal history of nicotine dependence: Secondary | ICD-10-CM | POA: Insufficient documentation

## 2023-11-02 DIAGNOSIS — I10 Essential (primary) hypertension: Secondary | ICD-10-CM | POA: Diagnosis not present

## 2023-11-02 DIAGNOSIS — I4892 Unspecified atrial flutter: Secondary | ICD-10-CM

## 2023-11-02 DIAGNOSIS — Z79899 Other long term (current) drug therapy: Secondary | ICD-10-CM | POA: Insufficient documentation

## 2023-11-02 DIAGNOSIS — D6869 Other thrombophilia: Secondary | ICD-10-CM | POA: Diagnosis not present

## 2023-11-02 DIAGNOSIS — I252 Old myocardial infarction: Secondary | ICD-10-CM | POA: Insufficient documentation

## 2023-11-02 DIAGNOSIS — I2510 Atherosclerotic heart disease of native coronary artery without angina pectoris: Secondary | ICD-10-CM | POA: Diagnosis not present

## 2023-11-02 DIAGNOSIS — E119 Type 2 diabetes mellitus without complications: Secondary | ICD-10-CM | POA: Insufficient documentation

## 2023-11-02 DIAGNOSIS — Z7984 Long term (current) use of oral hypoglycemic drugs: Secondary | ICD-10-CM | POA: Insufficient documentation

## 2023-11-02 DIAGNOSIS — E785 Hyperlipidemia, unspecified: Secondary | ICD-10-CM | POA: Diagnosis not present

## 2023-11-02 DIAGNOSIS — Z7901 Long term (current) use of anticoagulants: Secondary | ICD-10-CM | POA: Diagnosis not present

## 2023-11-02 DIAGNOSIS — C220 Liver cell carcinoma: Secondary | ICD-10-CM | POA: Diagnosis not present

## 2023-11-02 HISTORY — PX: CARDIOVERSION: EP1203

## 2023-11-02 SURGERY — CARDIOVERSION (CATH LAB)
Anesthesia: General

## 2023-11-02 MED ORDER — PROPOFOL 10 MG/ML IV BOLUS
INTRAVENOUS | Status: DC | PRN
  Administered 2023-11-02: 50 mg via INTRAVENOUS

## 2023-11-02 MED ORDER — SODIUM CHLORIDE 0.9 % IV SOLN: INTRAVENOUS | Status: DC

## 2023-11-02 MED ORDER — LIDOCAINE 2% (20 MG/ML) 5 ML SYRINGE
INTRAMUSCULAR | Status: DC | PRN
  Administered 2023-11-02: 60 mg via INTRAVENOUS

## 2023-11-02 SURGICAL SUPPLY — 1 items: PAD DEFIB RADIO PHYSIO CONN (PAD) ×1 IMPLANT

## 2023-11-02 NOTE — Transfer of Care (Signed)
 Immediate Anesthesia Transfer of Care Note  Patient: [name] [name]  Procedure(s) Performed: CARDIOVERSION  Patient Location: PACU  Anesthesia Type:General  Level of Consciousness: drowsy  Airway & Oxygen Therapy: Patient Spontanous Breathing and Patient connected to face mask oxygen  Post-op Assessment: Report given to RN and Post -op Vital signs reviewed and stable  Post vital signs: Reviewed and stable  Last Vitals:  Vitals Value Taken Time  BP    Temp    Pulse    Resp    SpO2      Last Pain:  Vitals:   11/02/23 0939  TempSrc:   PainSc: 0-No pain         Complications: There were no known notable events for this encounter.

## 2023-11-02 NOTE — Interval H&P Note (Signed)
 History and Physical Interval Note:  11/02/2023 9:20 AM  [name] [name]  has presented today for surgery, with the diagnosis of AFIB.  The various methods of treatment have been discussed with the patient and family. After consideration of risks, benefits and other options for treatment, the patient has consented to  Procedure(s): CARDIOVERSION (N/A) as a surgical intervention.  The patient's history has been reviewed, patient examined, no change in status, stable for surgery.  I have reviewed the patient's chart and labs.  Questions were answered to the patient's satisfaction.  No missed medication doses.  Confirmed with family, who are present at bedside.   [name] A Haide Klinker

## 2023-11-02 NOTE — Anesthesia Postprocedure Evaluation (Signed)
 Anesthesia Post Note  Patient: [name] [name]  Procedure(s) Performed: CARDIOVERSION     Patient location during evaluation: PACU Anesthesia Type: General Level of consciousness: awake and alert Pain management: pain level controlled Vital Signs Assessment: post-procedure vital signs reviewed and stable Respiratory status: spontaneous breathing, nonlabored ventilation and respiratory function stable Cardiovascular status: blood pressure returned to baseline Postop Assessment: no apparent nausea or vomiting Anesthetic complications: no   There were no known notable events for this encounter.  Last Vitals:  Vitals:   11/02/23 1119 11/02/23 1120  BP: 130/60 130/60  Pulse: 60 61  Resp: 20 18  Temp:    SpO2: 100% 100%    Last Pain:  Vitals:   11/02/23 0939  TempSrc:   PainSc: 0-No pain                 Vertell Row

## 2023-11-02 NOTE — CV Procedure (Signed)
    Electrical Cardioversion Procedure Note [name] [name] [id number] 03/15/1940  Procedure: Electrical Cardioversion Indications:  Atrial Flutter  Time Out: Verified patient identification, verified procedure,medications/allergies/relevent history reviewed, required imaging and test results available.  Performed  Procedure Details  The patient was NPO after midnight. Anesthesia was administered at the beside  by Dr.Howse.  Cardioversion was done with synchronized biphasic defibrillation with AP pads with 200 Joules.  The patient converted to sinus rhythm.  AP-VS on Medtronic device interrogation post procedure. The patient tolerated the procedure well.  IMPRESSION:  Successful cardioversion of atrial flutter.   [name] Leavens, MD FASE [facility] Cardiologist [facility]  [address] [place], [address] [place], [place] [address] [phone]  11:33 AM

## 2023-11-18 ENCOUNTER — Ambulatory Visit (HOSPITAL_COMMUNITY): Admitting: Physician Assistant

## 2023-11-23 ENCOUNTER — Ambulatory Visit (HOSPITAL_COMMUNITY)
Admission: RE | Admit: 2023-11-23 | Discharge: 2023-11-23 | Disposition: A | Discharge status: Home / Self Care | Source: Ambulatory Visit | Attending: Physician Assistant | Admitting: Physician Assistant

## 2023-11-23 VITALS — BP 124/62 | HR 60 | Ht 70.0 in | Wt 161.2 lb

## 2023-11-23 DIAGNOSIS — I4819 Other persistent atrial fibrillation: Secondary | ICD-10-CM | POA: Diagnosis not present

## 2023-11-23 DIAGNOSIS — I4891 Unspecified atrial fibrillation: Secondary | ICD-10-CM | POA: Diagnosis not present

## 2023-11-23 DIAGNOSIS — D6869 Other thrombophilia: Secondary | ICD-10-CM

## 2023-11-23 NOTE — Progress Notes (Signed)
 Primary Care Physician: Duanne Butler DASEN, MD Primary Cardiologist: None Electrophysiologist: Danelle Birmingham, MD  Referring Physician: Device clinic/Dr Kylie Gros is a 83 y.o. male with a history of CAD, HTN, HLD, hepatocellular carcinoma with cirrhosis, DM, atrial fibrillation who presents for follow up in the [facility].  The patient was noted by the device clinic to be persistently in afib since 08/22/23. He has felt more fatigued and dizzy. There were no specific triggers that he could identify. He does have a h/o falls but not head trauma.    Patient returns for follow up for atrial fibrillation. He is s/p DCCV on 11/02/23. He is in SR today. He states he feels better in SR with less dizziness and more energy. No bleeding issues on anticoagulation.   Today, he  denies symptoms of palpitations, chest pain, shortness of breath, orthopnea, PND, lower extremity edema, dizziness, presyncope, syncope, snoring, daytime somnolence, bleeding, or neurologic sequela. The patient is tolerating medications without difficulties and is otherwise without complaint today.    Atrial Fibrillation Risk Factors:  he does not have symptoms or diagnosis of sleep apnea. he does not have a history of rheumatic fever.   Atrial Fibrillation Management history:  Previous antiarrhythmic drugs: none Previous cardioversions: 11/02/23 Previous ablations: none Anticoagulation history: Eliquis   ROS- All systems are reviewed and negative except as per the HPI above.  Past Medical History:  Diagnosis Date   Anemia, chronic disease 10/07/2012   SEP 2014 HB 13 MV >90 PLT 99    Cataract    Dementia (HCC)    Diabetes mellitus without complication (HCC)    H. 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 60 tablet 5   aspirin  81 MG tablet Take 81 mg by mouth daily.     atorvastatin  (LIPITOR) 40 MG tablet TAKE 1 TABLET BY MOUTH AT BEDTIME 90 tablet 11   bacitracin  ointment Apply 1 Application topically 2 (two) times daily. 120 g 0   Blood Glucose Monitoring Suppl DEVI 1 each by Does not apply route in the morning, at noon, and at bedtime. May substitute to any manufacturer covered by patient's insurance. 1 each 0   Blood Glucose Monitoring Suppl DEVI 1 each by Does not apply route in the morning, at noon, and at bedtime. May substitute to any manufacturer covered by patient's insurance. 1 each 0   buPROPion  (WELLBUTRIN  XL) 150 MG 24 hr tablet TAKE 1 TABLET BY MOUTH ONCE DAILY 30 tablet 10   cyanocobalamin  (VITAMIN B12) 1000 MCG tablet Take 1 tablet (1,000 mcg total) by mouth daily. 90 tablet 3   donepezil  (ARICEPT ) 5 MG tablet TAKE 1 TABLET BY MOUTH EVERY DAY AT BEDTIME 30 tablet 10   folic acid  (FOLVITE ) 1 MG tablet TAKE 1 TABLET BY MOUTH ONCE DAILY 90 tablet 1  furosemide  (LASIX ) 20 MG tablet TAKE 1 TABLET BY MOUTH DAILY (Patient taking differently: Take 20 mg by mouth as needed.) 30 tablet 10   JANUVIA  100 MG tablet TAKE 1 TABLET BY MOUTH ONCE DAILY 30 tablet 10   lisinopril  (ZESTRIL ) 2.5 MG tablet TAKE 1 TABLET BY MOUTH ONCE DAILY 90 tablet 11   metFORMIN  (GLUCOPHAGE ) 500 MG tablet TAKE 2 TABLETS BY MOUTH AT BREAKFAST AND TAKE 2 TABLETS AT BEDTIME 360 tablet 11   metoprolol  tartrate (LOPRESSOR ) 50 MG tablet TAKE 1 TABLET BY MOUTH AT BREAKFAST AND TAKE 1 TABLET AT BEDTIME 180 tablet 11   nitroGLYCERIN   (NITROSTAT ) 0.4 MG SL tablet Place 1 tablet (0.4 mg total) under the tongue every 5 (five) minutes x 3 doses as needed for chest pain. 25 tablet 4   omeprazole  (PRILOSEC) 20 MG capsule TAKE 1 CAPSULE BY MOUTH AT BREAKFAST AND TAKE 1 CAPSULE AT BEDTIME 180 capsule 11   pioglitazone  (ACTOS ) 30 MG tablet Take 1 tablet (30 mg total) by mouth daily. 90 tablet 1   terazosin  (HYTRIN ) 5 MG capsule TAKE 1 CAPSULE BY MOUTH EVERY DAY AT BEDTIME 30 capsule 10   venlafaxine  XR (EFFEXOR -XR) 75 MG 24 hr capsule TAKE 1 CAPSULE BY MOUTH ONCE DAILY 90 capsule 11   No current facility-administered medications for this encounter.    Physical Exam: BP 124/62   Pulse 60   Ht 5' 10 (1.778 m)   Wt 73.1 kg   BMI 23.13 kg/m   GEN: Well nourished, well developed in no acute distress CARDIAC: Regular rate and rhythm, no murmurs, rubs, gallops RESPIRATORY:  Clear to auscultation without rales, wheezing or rhonchi  ABDOMEN: Soft, non-tender, non-distended EXTREMITIES:  No edema; No deformity    Wt Readings from Last 3 Encounters:  11/23/23 73.1 kg  10/22/23 73.9 kg  10/21/23 74 kg     EKG today demonstrates  A paced rhythm Vent. rate 60 BPM PR interval 214 ms QRS duration 80 ms QT/QTcB 438/438 ms   Echo 10/13/22 demonstrated   1. Left ventricular ejection fraction, by estimation, is 60 to 65%. The  left ventricle has normal function. The left ventricle has no regional  wall motion abnormalities. Left ventricular diastolic parameters are  indeterminate.   2. Right ventricular systolic function is normal. The right ventricular  size is normal. Tricuspid regurgitation signal is inadequate for assessing  PA pressure.   3. Left atrial size was moderately dilated.   4. The mitral valve is normal in structure. Trivial mitral valve  regurgitation. No evidence of mitral stenosis.   5. The tricuspid valve is abnormal.   6. The aortic valve is tricuspid. Aortic valve regurgitation is not  visualized. No  aortic stenosis is present.   7. Aortic dilatation noted. There is mild dilatation of the aortic root,  measuring 41 mm.   8. The inferior vena cava is normal in size with greater than 50%  respiratory variability, suggesting right atrial pressure of 3 mmHg.    CHA2DS2-VASc Score = 4  The patient's score is based upon: CHF History: 0 HTN History: 1 Diabetes History: 0 Stroke History: 0 Vascular Disease History: 1 Age Score: 2 Gender Score: 0       ASSESSMENT AND PLAN: Persistent Atrial Fibrillation (ICD10:  I48.19) The patient's CHA2DS2-VASc score is 4, indicating a 4.8% annual risk of stroke.   S/p DCCV on 11/02/23 Patient appears to be maintaining SR. He did notice symptomatic improvement with SR. If he has recurrence of  afib, would favor rhythm control if possible.  Continue Eliquis  5 mg BID Continue Lopressor  50 mg BID  Secondary Hypercoagulable State (ICD10:  D68.69) The patient is at significant risk for stroke/thromboembolism based upon his CHA2DS2-VASc Score of 4.  Continue Apixaban  (Eliquis ). No bleeding issues.   CAD H/o PCI 2009 No anginal symptoms  HTN Stable on current regimen  Sinus node dysfunction S/p PPM, followed by Dr Waddell   Follow up with EP APP in 3 months.    [facility] [address] [place], [place] [address] [phone]

## 2023-11-24 ENCOUNTER — Other Ambulatory Visit: Payer: Self-pay | Admitting: Family Medicine

## 2023-11-24 DIAGNOSIS — N138 Other obstructive and reflux uropathy: Secondary | ICD-10-CM

## 2023-12-23 ENCOUNTER — Other Ambulatory Visit: Payer: Self-pay | Admitting: Family Medicine

## 2023-12-23 DIAGNOSIS — F0150 Vascular dementia without behavioral disturbance: Secondary | ICD-10-CM

## 2023-12-25 ENCOUNTER — Ambulatory Visit: Payer: Medicare Other

## 2023-12-25 DIAGNOSIS — I4891 Unspecified atrial fibrillation: Secondary | ICD-10-CM | POA: Diagnosis not present

## 2023-12-25 LAB — CUP PACEART REMOTE DEVICE CHECK
Battery Remaining Longevity: 125 mo
Battery Voltage: 3.01 V
Brady Statistic AP VP Percent: 0.03 %
Brady Statistic AP VS Percent: 45.73 %
Brady Statistic AS VP Percent: 0.05 %
Brady Statistic AS VS Percent: 54.18 %
Brady Statistic RA Percent Paced: 46.28 %
Brady Statistic RV Percent Paced: 0.09 %
Date Time Interrogation Session: 20251120230736
Implantable Pulse Generator Implant Date: 20220420
Lead Channel Impedance Value: 323 Ohm
Lead Channel Impedance Value: 342 Ohm
Lead Channel Impedance Value: 532 Ohm
Lead Channel Impedance Value: 608 Ohm
Lead Channel Pacing Threshold Amplitude: 0.625 V
Lead Channel Pacing Threshold Amplitude: 1.25 V
Lead Channel Pacing Threshold Pulse Width: 0.4 ms
Lead Channel Pacing Threshold Pulse Width: 0.4 ms
Lead Channel Sensing Intrinsic Amplitude: 2.125 mV
Lead Channel Sensing Intrinsic Amplitude: 2.125 mV
Lead Channel Sensing Intrinsic Amplitude: 6.75 mV
Lead Channel Sensing Intrinsic Amplitude: 6.75 mV
Lead Channel Setting Pacing Amplitude: 1.5 V
Lead Channel Setting Pacing Amplitude: 2.5 V
Lead Channel Setting Pacing Pulse Width: 0.4 ms
Lead Channel Setting Sensing Sensitivity: 1.2 mV
Zone Setting Status: 755011
Zone Setting Status: 755011

## 2023-12-27 ENCOUNTER — Ambulatory Visit: Payer: Self-pay | Admitting: Internal Medicine

## 2023-12-28 NOTE — Progress Notes (Signed)
 Remote PPM Transmission

## 2024-01-22 ENCOUNTER — Other Ambulatory Visit: Payer: Self-pay | Admitting: Family Medicine

## 2024-02-12 ENCOUNTER — Encounter: Payer: Self-pay | Admitting: Student

## 2024-02-12 ENCOUNTER — Ambulatory Visit: Attending: Student | Admitting: Student

## 2024-02-12 VITALS — BP 126/64 | HR 59 | Ht 70.0 in | Wt 172.6 lb

## 2024-02-12 DIAGNOSIS — D6869 Other thrombophilia: Secondary | ICD-10-CM | POA: Diagnosis not present

## 2024-02-12 DIAGNOSIS — I2510 Atherosclerotic heart disease of native coronary artery without angina pectoris: Secondary | ICD-10-CM

## 2024-02-12 DIAGNOSIS — I495 Sick sinus syndrome: Secondary | ICD-10-CM

## 2024-02-12 DIAGNOSIS — I4819 Other persistent atrial fibrillation: Secondary | ICD-10-CM

## 2024-02-12 DIAGNOSIS — Z9861 Coronary angioplasty status: Secondary | ICD-10-CM

## 2024-02-12 LAB — CUP PACEART INCLINIC DEVICE CHECK
Battery Remaining Longevity: 124 mo
Battery Voltage: 3.01 V
Brady Statistic AP VP Percent: 0.03 %
Brady Statistic AP VS Percent: 51.96 %
Brady Statistic AS VP Percent: 0.05 %
Brady Statistic AS VS Percent: 47.96 %
Brady Statistic RA Percent Paced: 52.43 %
Brady Statistic RV Percent Paced: 0.08 %
Date Time Interrogation Session: 20260109103228
Implantable Pulse Generator Implant Date: 20220420
Lead Channel Impedance Value: 323 Ohm
Lead Channel Impedance Value: 342 Ohm
Lead Channel Impedance Value: 551 Ohm
Lead Channel Impedance Value: 608 Ohm
Lead Channel Pacing Threshold Amplitude: 0.5 V
Lead Channel Pacing Threshold Amplitude: 1.375 V
Lead Channel Pacing Threshold Pulse Width: 0.4 ms
Lead Channel Pacing Threshold Pulse Width: 0.4 ms
Lead Channel Sensing Intrinsic Amplitude: 1.5 mV
Lead Channel Sensing Intrinsic Amplitude: 1.875 mV
Lead Channel Sensing Intrinsic Amplitude: 7.625 mV
Lead Channel Sensing Intrinsic Amplitude: 8 mV
Lead Channel Setting Pacing Amplitude: 1.5 V
Lead Channel Setting Pacing Amplitude: 2.75 V
Lead Channel Setting Pacing Pulse Width: 0.4 ms
Lead Channel Setting Sensing Sensitivity: 1.2 mV
Zone Setting Status: 755011
Zone Setting Status: 755011

## 2024-02-12 NOTE — Patient Instructions (Signed)
 Medication Instructions:  No medication changes today. *If you need a refill on your cardiac medications before your next appointment, please call your pharmacy*  Lab Work: No labwork ordered today. If you have labs (blood work) drawn today and your tests are completely normal, you will receive your results only by: MyChart Message (if you have MyChart) OR A paper copy in the mail If you have any lab test that is abnormal or we need to change your treatment, we will call you to review the results.  Testing/Procedures: No testing ordered today  Follow-Up: At [facility], you and your health needs are our priority.  As part of our continuing mission to provide you with exceptional heart care, our providers are all part of one team.  This team includes your primary Cardiologist (physician) and Advanced Practice Providers or APPs (Physician Assistants and Nurse Practitioners) who all work together to provide you with the care you need, when you need it.  Your next appointment:   12 month(s)  Provider:   You may see [name] DELENA Primus, MD or one of the following Advanced Practice Providers on your designated Care Team:   Charlies Arthur, PA-C Dior Dominik Andy Stepan Verrette, PA-C Suzann Riddle, NP Daphne Barrack, NP    We recommend signing up for the patient portal called MyChart.  Sign up information is provided on this After Visit Summary.  MyChart is used to connect with patients for Virtual Visits (Telemedicine).  Patients are able to view lab/test results, encounter notes, upcoming appointments, etc.  Non-urgent messages can be sent to your provider as well.   To learn more about what you can do with MyChart, go to [email or web address].

## 2024-02-12 NOTE — Progress Notes (Signed)
" °  Electrophysiology Office Note:   ID:  [name] [name], DOB 10-29-1940, MRN [id number]  Primary Cardiologist: None Electrophysiologist: Donnice DELENA Primus, MD      History of Present Illness:   [name] [name] is a 84 y.o. male with h/o CAD, HTN, HLD, hepatocellular carcinoma with cirrhosis, DM, atrial fibrillation seen today for routine electrophysiology followup.   Since last being seen in our clinic the patient reports doing well. Overall, he denies chest pain, palpitations, dyspnea, PND, orthopnea, nausea, vomiting, dizziness, syncope, edema, weight gain, or early satiety.   Review of systems complete and found to be negative unless listed in HPI.   EP Information / Studies Reviewed:    EKG is not ordered today. EKG from 11/23/2023 reviewed which showed A paced rhythm at 60 bpm       PPM Interrogation-  reviewed in detail today,  See PACEART report.  Arrhythmia/Device History PPM-Medtronic-Carelink-GT   Physical Exam:   VS:  BP 126/64   Pulse (!) 59   Ht 5' 10 (1.778 m)   Wt 172 lb 9.6 oz (78.3 kg)   SpO2 98%   BMI 24.77 kg/m    Wt Readings from Last 3 Encounters:  02/12/24 172 lb 9.6 oz (78.3 kg)  11/23/23 161 lb 3.2 oz (73.1 kg)  10/22/23 163 lb (73.9 kg)     GEN: No acute distress  NECK: No JVD; No carotid bruits CARDIAC: Regular rate and rhythm, no murmurs, rubs, gallops RESPIRATORY:  Clear to auscultation without rales, wheezing or rhonchi  ABDOMEN: Soft, non-tender, non-distended EXTREMITIES:  No edema; No deformity   ASSESSMENT AND PLAN:    SND s/p Medtronic PPM  Normal PPM function See Pace Art report No changes today  Persistent AF Secondary hypercoagulable state No further by device check S/p [facility] 11/02/2023 Symptoms improved with SR, if recurs would favor rhythm control Continue eliquis  5 mg BID for CHA2DS2VASc of at least 4   CAD No s/s of ischemia.     HTN Stable on current regimen   Disposition:   Follow up with Dr. Primus in 12 months in  [place]. Sooner with recurrent arrhythmia   Signed, Ozell Prentice Passey, PA-C  "

## 2024-02-14 ENCOUNTER — Ambulatory Visit: Payer: Self-pay | Admitting: Student in an Organized Health Care Education/Training Program

## 2024-02-25 ENCOUNTER — Other Ambulatory Visit: Payer: Self-pay | Admitting: Family Medicine

## 2024-02-25 ENCOUNTER — Other Ambulatory Visit

## 2024-02-25 DIAGNOSIS — N401 Enlarged prostate with lower urinary tract symptoms: Secondary | ICD-10-CM

## 2024-02-25 DIAGNOSIS — E0800 Diabetes mellitus due to underlying condition with hyperosmolarity without nonketotic hyperglycemic-hyperosmolar coma (NKHHC): Secondary | ICD-10-CM

## 2024-02-25 DIAGNOSIS — F0150 Vascular dementia without behavioral disturbance: Secondary | ICD-10-CM

## 2024-02-25 DIAGNOSIS — E1149 Type 2 diabetes mellitus with other diabetic neurological complication: Secondary | ICD-10-CM

## 2024-02-25 DIAGNOSIS — I10 Essential (primary) hypertension: Secondary | ICD-10-CM

## 2024-02-25 DIAGNOSIS — I2510 Atherosclerotic heart disease of native coronary artery without angina pectoris: Secondary | ICD-10-CM

## 2024-02-25 DIAGNOSIS — R7989 Other specified abnormal findings of blood chemistry: Secondary | ICD-10-CM

## 2024-02-25 DIAGNOSIS — Z0000 Encounter for general adult medical examination without abnormal findings: Secondary | ICD-10-CM

## 2024-02-25 DIAGNOSIS — R0602 Shortness of breath: Secondary | ICD-10-CM

## 2024-02-25 DIAGNOSIS — B182 Chronic viral hepatitis C: Secondary | ICD-10-CM

## 2024-02-26 ENCOUNTER — Ambulatory Visit: Payer: Self-pay | Admitting: Family Medicine

## 2024-02-26 ENCOUNTER — Other Ambulatory Visit: Payer: Self-pay | Admitting: Family Medicine

## 2024-02-26 DIAGNOSIS — F322 Major depressive disorder, single episode, severe without psychotic features: Secondary | ICD-10-CM

## 2024-02-26 LAB — CBC WITH DIFFERENTIAL/PLATELET
Absolute Lymphocytes: 1624 {cells}/uL (ref 850–3900)
Absolute Monocytes: 589 {cells}/uL (ref 200–950)
Basophils Absolute: 50 {cells}/uL (ref 0–200)
Basophils Relative: 0.8 %
Eosinophils Absolute: 242 {cells}/uL (ref 15–500)
Eosinophils Relative: 3.9 %
HCT: 29.2 % — ABNORMAL LOW (ref 39.4–51.1)
Hemoglobin: 8.9 g/dL — ABNORMAL LOW (ref 13.2–17.1)
MCH: 28.1 pg (ref 27.0–33.0)
MCHC: 30.5 g/dL — ABNORMAL LOW (ref 31.6–35.4)
MCV: 92.1 fL (ref 81.4–101.7)
MPV: 13.5 fL — ABNORMAL HIGH (ref 7.5–12.5)
Monocytes Relative: 9.5 %
Neutro Abs: 3695 {cells}/uL (ref 1500–7800)
Neutrophils Relative %: 59.6 %
Platelets: 121 Thousand/uL — ABNORMAL LOW (ref 140–400)
RBC: 3.17 Million/uL — ABNORMAL LOW (ref 4.20–5.80)
RDW: 13.1 % (ref 11.0–15.0)
Total Lymphocyte: 26.2 %
WBC: 6.2 Thousand/uL (ref 3.8–10.8)

## 2024-02-26 LAB — HEMOGLOBIN A1C
Hgb A1c MFr Bld: 6.2 % — ABNORMAL HIGH
Mean Plasma Glucose: 131 mg/dL
eAG (mmol/L): 7.3 mmol/L

## 2024-02-26 LAB — COMPLETE METABOLIC PANEL WITHOUT GFR
AG Ratio: 1.4 (calc) (ref 1.0–2.5)
ALT: 11 U/L (ref 9–46)
AST: 15 U/L (ref 10–35)
Albumin: 4.2 g/dL (ref 3.6–5.1)
Alkaline phosphatase (APISO): 83 U/L (ref 35–144)
BUN: 20 mg/dL (ref 7–25)
CO2: 27 mmol/L (ref 20–32)
Calcium: 9.6 mg/dL (ref 8.6–10.3)
Chloride: 102 mmol/L (ref 98–110)
Creat: 1 mg/dL (ref 0.70–1.22)
Globulin: 3 g/dL (ref 1.9–3.7)
Glucose, Bld: 134 mg/dL — ABNORMAL HIGH (ref 65–99)
Potassium: 5.4 mmol/L — ABNORMAL HIGH (ref 3.5–5.3)
Sodium: 137 mmol/L (ref 135–146)
Total Bilirubin: 0.3 mg/dL (ref 0.2–1.2)
Total Protein: 7.2 g/dL (ref 6.1–8.1)

## 2024-02-26 LAB — LIPID PANEL
Cholesterol: 125 mg/dL
HDL: 58 mg/dL
LDL Cholesterol (Calc): 47 mg/dL
Non-HDL Cholesterol (Calc): 67 mg/dL
Total CHOL/HDL Ratio: 2.2 (calc)
Triglycerides: 114 mg/dL

## 2024-02-26 LAB — MICROALBUMIN / CREATININE URINE RATIO
Creatinine, Urine: 180 mg/dL (ref 20–320)
Microalb Creat Ratio: 7 mg/g{creat}
Microalb, Ur: 1.2 mg/dL

## 2024-02-26 LAB — VITAMIN B12: Vitamin B-12: 504 pg/mL (ref 200–1100)

## 2024-02-29 NOTE — Telephone Encounter (Signed)
 Requested by interface surescripts.  Requested Prescriptions  Pending Prescriptions Disp Refills   buPROPion  (WELLBUTRIN  XL) 150 MG 24 hr tablet [Pharmacy Med Name: BUPROPION  XL 150MG  TAB 150 Tablet] 30 tablet 1    Sig: TAKE 1 TABLET BY MOUTH ONCE DAILY     Psychiatry: Antidepressants - bupropion  Passed - 02/29/2024  8:44 AM      Passed - Cr in normal range and within 360 days    Creat  Date Value Ref Range Status  02/25/2024 1.00 0.70 - 1.22 mg/dL Final   Creatinine, Urine  Date Value Ref Range Status  02/25/2024 180 20 - 320 mg/dL Final         Passed - AST in normal range and within 360 days    AST  Date Value Ref Range Status  02/25/2024 15 10 - 35 U/L Final         Passed - ALT in normal range and within 360 days    ALT  Date Value Ref Range Status  02/25/2024 11 9 - 46 U/L Final         Passed - Completed PHQ-2 or PHQ-9 in the last 360 days      Passed - Last BP in normal range    BP Readings from Last 1 Encounters:  02/12/24 126/64         Passed - Valid encounter within last 6 months    Recent Outpatient Visits           6 months ago Urine discoloration   [place] [facility] Duanne Butler DASEN, MD   6 months ago Type II diabetes mellitus with neurological manifestations [facility])   [place] [facility] Duanne Butler DASEN, MD   7 months ago Olecranon bursitis of left elbow   [place] [facility] Duanne, Butler DASEN, MD   1 year ago Flu vaccine need   [place] [facility] Duanne Butler DASEN, MD   1 year ago Type II diabetes mellitus with neurological manifestations [facility])    [facility] [place], Butler DASEN, MD

## 2024-06-24 ENCOUNTER — Ambulatory Visit

## 2024-09-23 ENCOUNTER — Ambulatory Visit

## 2024-10-27 ENCOUNTER — Ambulatory Visit

## 2024-12-23 ENCOUNTER — Ambulatory Visit

## 2025-03-24 ENCOUNTER — Ambulatory Visit

## 2025-06-23 ENCOUNTER — Ambulatory Visit

## 2025-09-22 ENCOUNTER — Ambulatory Visit

## 2025-12-22 ENCOUNTER — Ambulatory Visit

## 2026-03-23 ENCOUNTER — Ambulatory Visit
